# Patient Record
Sex: Female | Born: 1965
Health system: Southern US, Community
[De-identification: ages and names within clinical notes are randomized; demographics above are authoritative.]

## PROBLEM LIST (undated history)

## (undated) DIAGNOSIS — E669 Obesity, unspecified: Secondary | ICD-10-CM

## (undated) DIAGNOSIS — F419 Anxiety disorder, unspecified: Secondary | ICD-10-CM

## (undated) DIAGNOSIS — I10 Essential (primary) hypertension: Secondary | ICD-10-CM

## (undated) DIAGNOSIS — E7800 Pure hypercholesterolemia, unspecified: Secondary | ICD-10-CM

## (undated) HISTORY — PX: ABDOMINAL HYSTERECTOMY: SHX81

## (undated) HISTORY — PX: CHOLECYSTECTOMY: SHX55

## (undated) HISTORY — PX: GASTRIC BYPASS: SHX52

---

## 1999-06-18 ENCOUNTER — Emergency Department (HOSPITAL_COMMUNITY): Admission: EM | Admit: 1999-06-18 | Discharge: 1999-06-18 | Payer: Self-pay | Admitting: Family Medicine

## 1999-06-30 ENCOUNTER — Emergency Department (HOSPITAL_COMMUNITY): Admission: EM | Admit: 1999-06-30 | Discharge: 1999-07-01 | Payer: Self-pay | Admitting: Emergency Medicine

## 2000-03-04 ENCOUNTER — Other Ambulatory Visit: Admission: RE | Admit: 2000-03-04 | Discharge: 2000-03-04 | Payer: Self-pay | Admitting: Gynecology

## 2000-05-09 ENCOUNTER — Emergency Department (HOSPITAL_COMMUNITY): Admission: EM | Admit: 2000-05-09 | Discharge: 2000-05-09 | Payer: Self-pay

## 2000-10-07 ENCOUNTER — Ambulatory Visit (HOSPITAL_COMMUNITY): Admission: RE | Admit: 2000-10-07 | Discharge: 2000-10-07 | Payer: Self-pay | Admitting: Obstetrics & Gynecology

## 2003-07-19 ENCOUNTER — Emergency Department (HOSPITAL_COMMUNITY): Admission: EM | Admit: 2003-07-19 | Discharge: 2003-07-19 | Payer: Self-pay | Admitting: *Deleted

## 2003-11-23 ENCOUNTER — Encounter: Admission: RE | Admit: 2003-11-23 | Discharge: 2003-11-23 | Payer: Self-pay | Admitting: Obstetrics and Gynecology

## 2003-12-17 ENCOUNTER — Ambulatory Visit (HOSPITAL_COMMUNITY): Admission: RE | Admit: 2003-12-17 | Discharge: 2003-12-17 | Payer: Self-pay | Admitting: Obstetrics and Gynecology

## 2004-02-27 ENCOUNTER — Emergency Department (HOSPITAL_COMMUNITY): Admission: EM | Admit: 2004-02-27 | Discharge: 2004-02-27 | Payer: Self-pay | Admitting: Emergency Medicine

## 2005-03-09 ENCOUNTER — Emergency Department (HOSPITAL_COMMUNITY): Admission: EM | Admit: 2005-03-09 | Discharge: 2005-03-10 | Payer: Self-pay | Admitting: Emergency Medicine

## 2005-07-03 ENCOUNTER — Encounter (INDEPENDENT_AMBULATORY_CARE_PROVIDER_SITE_OTHER): Payer: Self-pay | Admitting: *Deleted

## 2005-07-03 ENCOUNTER — Ambulatory Visit: Payer: Self-pay | Admitting: Obstetrics and Gynecology

## 2005-08-23 ENCOUNTER — Emergency Department (HOSPITAL_COMMUNITY): Admission: EM | Admit: 2005-08-23 | Discharge: 2005-08-23 | Payer: Self-pay | Admitting: Emergency Medicine

## 2006-09-05 ENCOUNTER — Encounter: Payer: Self-pay | Admitting: Obstetrics and Gynecology

## 2006-09-05 ENCOUNTER — Ambulatory Visit: Payer: Self-pay | Admitting: Obstetrics and Gynecology

## 2006-09-11 ENCOUNTER — Ambulatory Visit (HOSPITAL_COMMUNITY): Admission: RE | Admit: 2006-09-11 | Discharge: 2006-09-11 | Payer: Self-pay | Admitting: Obstetrics and Gynecology

## 2009-04-07 ENCOUNTER — Emergency Department (HOSPITAL_COMMUNITY): Admission: EM | Admit: 2009-04-07 | Discharge: 2009-04-07 | Payer: Self-pay | Admitting: Emergency Medicine

## 2009-05-22 ENCOUNTER — Emergency Department (HOSPITAL_COMMUNITY): Admission: EM | Admit: 2009-05-22 | Discharge: 2009-05-22 | Payer: Self-pay | Admitting: Emergency Medicine

## 2009-07-13 ENCOUNTER — Encounter: Admission: RE | Admit: 2009-07-13 | Discharge: 2009-07-13 | Payer: Self-pay | Admitting: Internal Medicine

## 2009-08-08 ENCOUNTER — Ambulatory Visit (HOSPITAL_COMMUNITY): Admission: RE | Admit: 2009-08-08 | Discharge: 2009-08-08 | Payer: Self-pay | Admitting: Cardiology

## 2010-11-30 IMAGING — CR DG CHEST 2V
2 series · 2 of 2 positions shown · non-contrast
Comparison: None available.

CLINICAL DATA: Shortness of breath and weakness.  Body aches.
Headache and fever.

CHEST - 2 VIEW

[w chest pa]
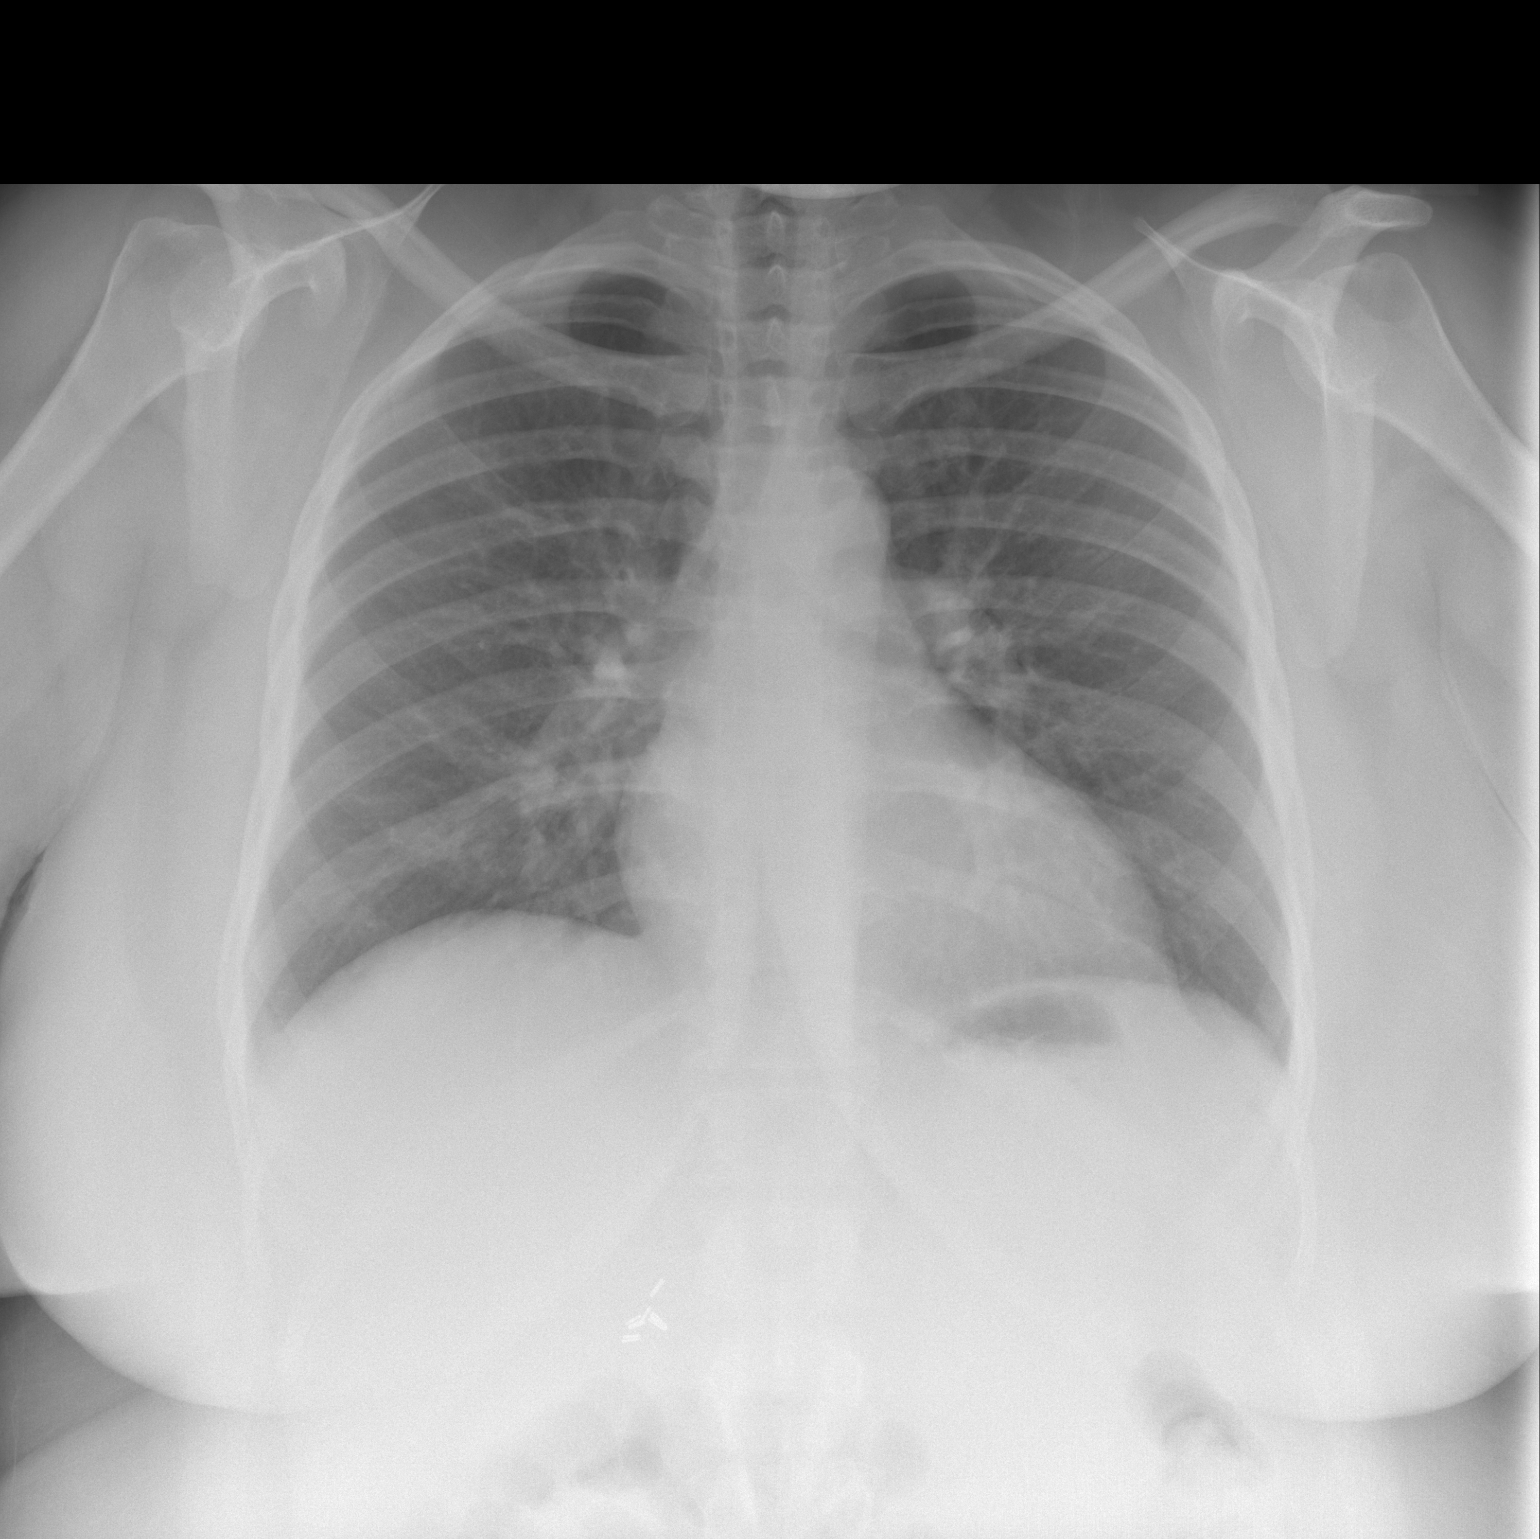

[w chest lat]
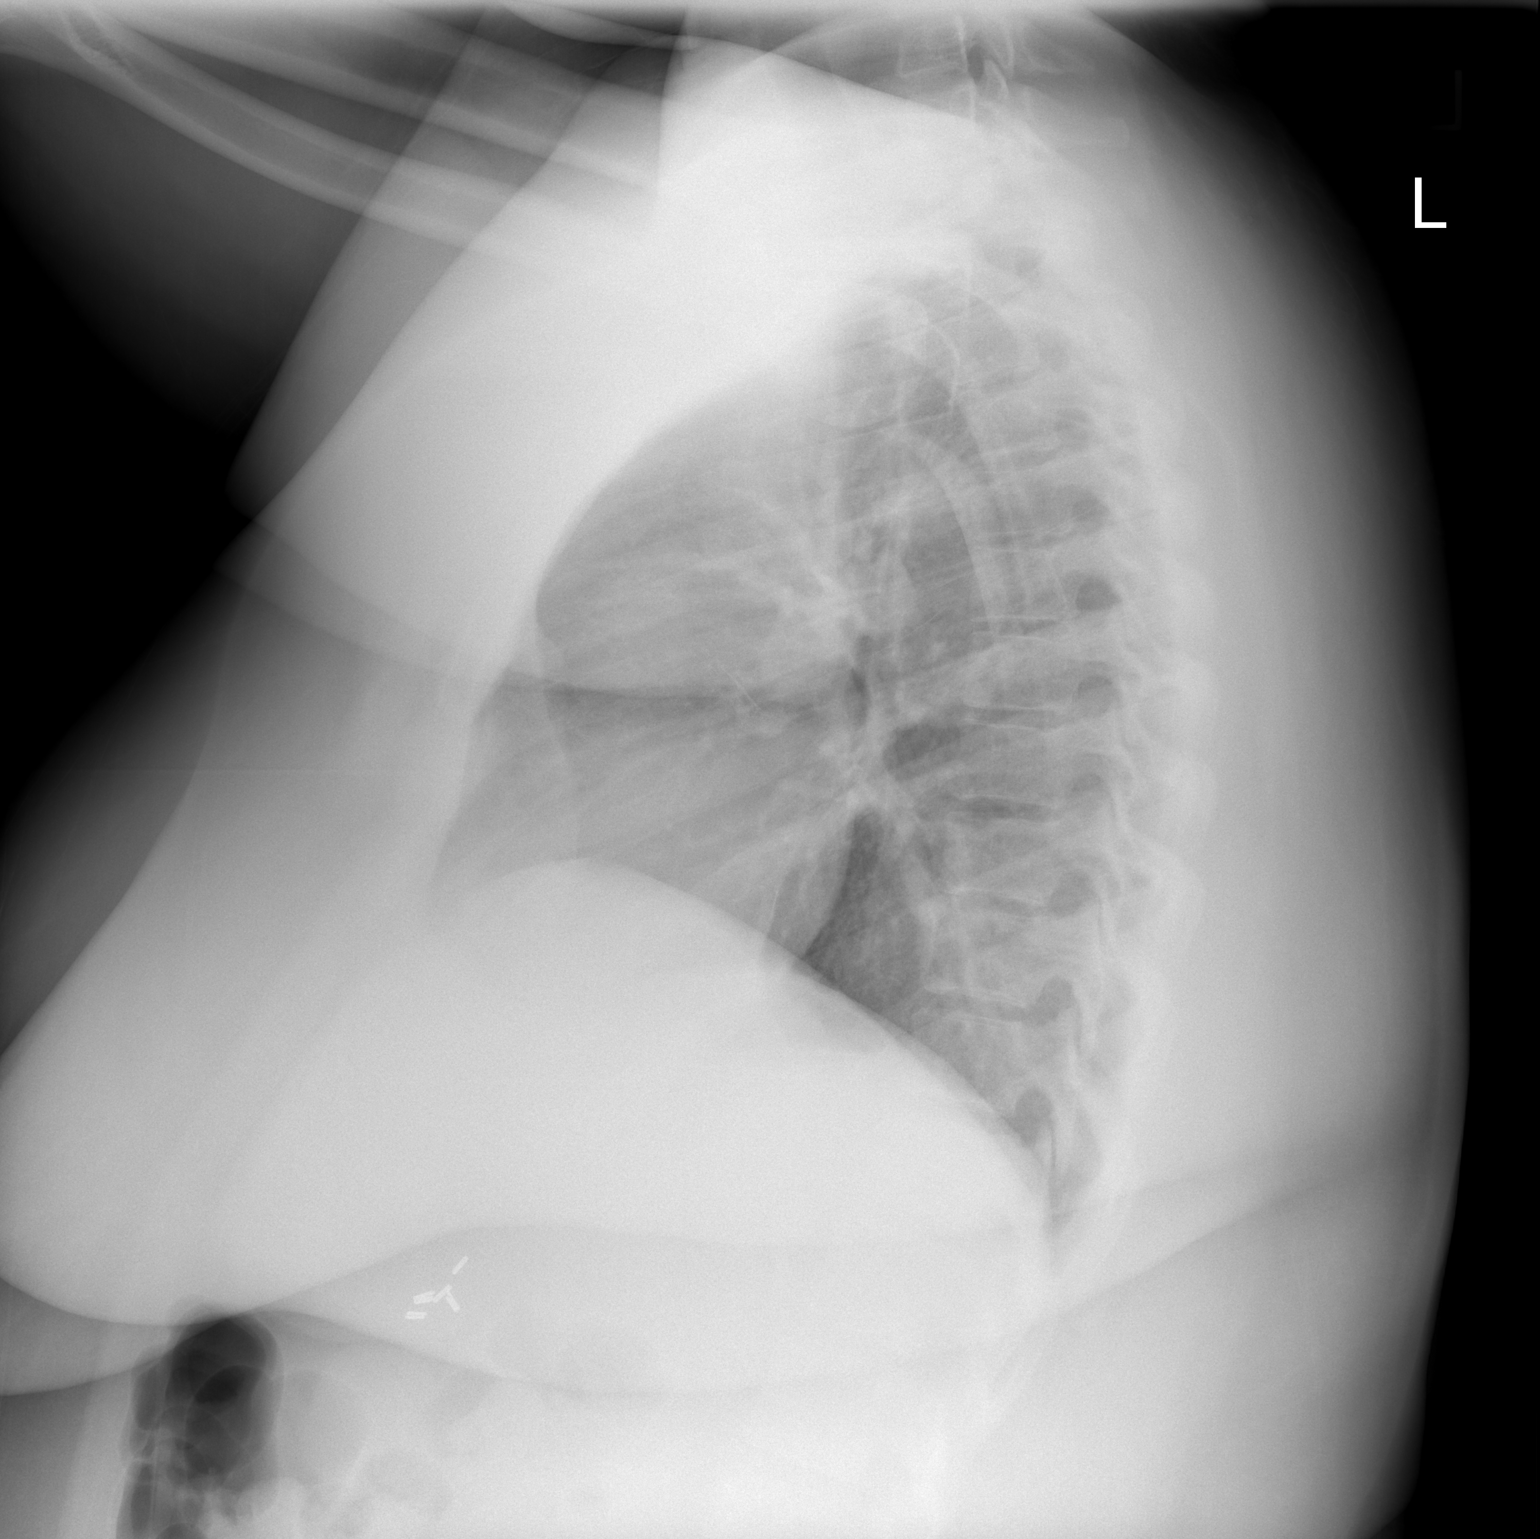

[2 of 2 positions shown; findings below may reference images not displayed]

FINDINGS: The cardiopericardial silhouette is within normal limits
for size.  Lung volumes are somewhat low.  No focal airspace
disease is present.  The visualized soft tissues and bony thorax
are unremarkable.
IMPRESSION: 1.  No acute cardiopulmonary disease.
2.  Low lung volumes.

## 2010-12-13 LAB — APTT: aPTT: 28 seconds (ref 24–37)

## 2010-12-13 LAB — BASIC METABOLIC PANEL
Calcium: 8.7 mg/dL (ref 8.4–10.5)
Creatinine, Ser: 0.53 mg/dL (ref 0.4–1.2)
GFR calc Af Amer: >60 mL/min (ref >60)
GFR calc non Af Amer: >60 mL/min (ref >60)

## 2010-12-13 LAB — CBC
MCHC: 32.6 g/dL (ref 30.0–36.0)
RBC: 3.48 MIL/uL — ABNORMAL LOW (ref 3.87–5.11)
WBC: 8.6 10*3/uL (ref 4.0–10.5)

## 2010-12-13 LAB — HCG, SERUM, QUALITATIVE: Preg, Serum: NEGATIVE

## 2010-12-13 LAB — PROTIME-INR: Prothrombin Time: 13.1 seconds (ref 11.6–15.2)

## 2010-12-17 LAB — POCT PREGNANCY, URINE: Preg Test, Ur: NEGATIVE

## 2010-12-17 LAB — BASIC METABOLIC PANEL
BUN: 10 mg/dL (ref 6–23)
Chloride: 107 mEq/L (ref 96–112)
Creatinine, Ser: 0.72 mg/dL (ref 0.4–1.2)

## 2010-12-17 LAB — URINALYSIS, ROUTINE W REFLEX MICROSCOPIC
Glucose, UA: NEGATIVE mg/dL
Ketones, ur: NEGATIVE mg/dL
Protein, ur: NEGATIVE mg/dL

## 2010-12-17 LAB — CBC
MCHC: 31.4 g/dL (ref 30.0–36.0)
MCV: 77 fL — ABNORMAL LOW (ref 78.0–100.0)
Platelets: 490 10*3/uL — ABNORMAL HIGH (ref 150–400)
WBC: 10.5 10*3/uL (ref 4.0–10.5)

## 2010-12-17 LAB — DIFFERENTIAL
Basophils Absolute: 0 10*3/uL (ref 0.0–0.1)
Basophils Relative: 0 % (ref 0–1)
Eosinophils Absolute: 0.2 10*3/uL (ref 0.0–0.7)
Lymphs Abs: 2.7 10*3/uL (ref 0.7–4.0)
Neutrophils Relative %: 61 % (ref 43–77)

## 2011-01-26 NOTE — Group Therapy Note (Signed)
NAME:  Vanessa Wells, MAJ             ACCOUNT NO.:  [id number]   MEDICAL RECORD NO.:  [id number]          PATIENT TYPE:  WOC   LOCATION:  [facility]                   FACILITY:  [facility]   PHYSICIAN:  Barth Kirks, MD    DATE OF BIRTH:  1965/12/31   DATE OF SERVICE:                                  CLINIC NOTE   CHIEF COMPLAINT:  Yearly exam with Pap and breast exam.   HISTORY OF PRESENT ILLNESS:  Patient is a 45 year old gravida 3, para 2-  0-1-2, who presents today for her yearly exam.  Since she was last seen  a year ago, she has been having problems with weight gain and fatigue.  Patient states she has been trying to lose weight, however, has not been  fully committed to it, and has not been exercising.  Patient still has  monthly periods.  Her last menstrual period was September 12.  Period  lasted 6-7 days.  She has been taking Ortho Tri-Cyclen Lo 20 and needs a  refill.  Patient is a nonsmoker.  She does not have problems with  migraines.  She currently is a bus driver.  She has never had a history  of a blood clot.   FAMILY HISTORY:  Patient does have an aunt on her mother's side who died  of breast cancer.  She also has a history of colon cancer in the family,  as well as a history of stomach cancer.  No history of uterine, cervix,  or ovarian cancer in the family.   REVIEW OF SYSTEMS:  Other than fatigue and weight gain, otherwise  negative.   PHYSICAL EXAMINATION:  Temperature today was 98.9, pulse 93, blood  pressure 145/84.  Weight was 293 pounds.  This is a 20 pound weight gain  since last year.  Height is 5 foot 4 inches.  She has never had a  mammogram.  GENERAL:  She is an obese [place] American female, pleasant appearing.  HEENT:  Normocephalic, atraumatic.  Extraocular muscles intact.  Moist  mucous membranes.  NECK:  Thyroid is noted to be slightly enlarged with no current pain or  soreness, however it does swell on her occasionally.  CARDIOVASCULAR:  She is  regular rate and rhythm, no murmurs, rubs, or  gallops.  LUNGS:  Clear to auscultation bilaterally.  ABDOMEN:  Soft, nontender, nondistended.  BREAST EXAM:  Nontender, no masses.  GYN EXAM:  Using chaperone present for the breast and GYN exam, patient  had a normal cervix, and no noted discharge, no palpable ovaries or  masses.  Pap smear was obtained.  LEGS:  There was bilateral +1 swelling to the feet.   ASSESSMENT/PLAN:  1. This is a 45 year old gravida 3, para 2-0-1-2, here for annual      exam.  We do go ahead and obtain a Pap smear today.  We scheduled      her for her baseline mammogram since she has a history of breast      cancer in the family.  We will schedule this appointment for her.  2. For her fatigue and enlarged thyroid, we will go ahead and  check a      TSH today.  She had a thyroid test done back in 2005 of March which      showed normal thyroid at 3.465 on her TSH.  Since she does have      history of thyroid problems in the family, we will go ahead and      check it out at this time.  3. Obesity.  Went ahead and encouraged the patient to begin exercising      and to get involved in a weight loss program.  She is also to seek      the advice of her primary care physician since she does have      insurance, and she does have medical problems that need to be      addressed by a primary care physician.  4. Followup 1 year.           ______________________________  Barth Kirks, MD     MB/MEDQ  D:  09/05/2006  T:  09/05/2006  Job:  [id number]

## 2011-01-26 NOTE — Group Therapy Note (Signed)
NAME:  Vanessa Wells, Vanessa Wells                       ACCOUNT NO.:  [id number]   MEDICAL RECORD NO.:  [id number]                   PATIENT TYPE:  OUT   LOCATION:  [facility]                           FACILITY:  [facility]   PHYSICIAN:  Argentina Donovan, MD                     DATE OF BIRTH:  September 26, 1965   DATE OF SERVICE:  11/23/2003                                    CLINIC NOTE   REASON FOR VISIT:  The patient is a 45 year old African-American female who  is here today for Pap smear.  The patient also complained of irregular  cycles over the past 3 months that has actually been gradually over the past  year.  She has been having periods at least twice a month and has been heavy  occasionally with some clots.  She had her menarche at the age of 66 and she  has had normal periods through that period.  She is a gravida 3 para 2 plus  an abortion, has two daughters alive.  The patient has not been sexually  active over the past 1 year.  She has, however, noted increase in her weight  over the same period.   ALLERGIES:  No known drug allergies.   SOCIAL HISTORY:  The patient is single, lives alone in [place], denies  any tobacco use, occasional alcohol but that is it.   PAST MEDICAL HISTORY:  She is status post cholecystectomy, history of  bronchitis, and obesity.   MEDICATIONS:  Multivitamin supplement.   FAMILY HISTORY:  Mom had thyroid disease and diabetes.  Grandmom died of  cancer of the stomach and aunt died of breast cancer.   REVIEW OF SYSTEMS:  Grossly normal.   PHYSICAL EXAMINATION:  VITAL SIGNS:  She is afebrile with a pulse of 80,  blood pressure is 140/91, weight of 276.6.  GENERAL:  She is alert, oriented, in no acute distress.  CHEST:  Clear to auscultation bilaterally.  CARDIOVASCULAR:  She has a regular rate and rhythm.  ABDOMEN:  Benign.  EXTREMITIES:  Showed no edema, cyanosis, or clubbing.   ASSESSMENT AND PLAN:  1. Dysfunctional uterine bleeding.  The patient seems to  have DUB.  Combined     with her weight gain could be hormonal.  Could also be that she is PCOS.     At this time we will start first with trying to regulate the patient's     periods using birth control pills.  I have given her Ortho Tri-Cyclen     today.  We will also do a vaginal ultrasound as well as TSH.  The patient     may require screening for CBG for diabetes as well with her family     history.  2. The patient requests for HIV test secondary to high-risk exposure about a     year ago.  Therefore, we will go ahead and check her for HIV as mentioned  and she has been counseled accordingly.     Cassandria Santee, M.D.                Argentina Donovan, MD    MLG/MEDQ  D:  11/23/2003  T:  11/24/2003  Job:  [id number]

## 2011-01-26 NOTE — Group Therapy Note (Signed)
NAME:  Vanessa Wells, Vanessa Wells NO.:  [id number]   MEDICAL RECORD NO.:  [id number]          PATIENT TYPE:  WOC   LOCATION:  [facility]                   FACILITY:  [facility]   PHYSICIAN:  Ellis Parents, MD    DATE OF BIRTH:  Mar 12, 1966   DATE OF SERVICE:                                    CLINIC NOTE   SUBJECTIVE:  This is a 45 year old female who returns for a routine exam and  refill on her OCs.  The patient has no significant complaints.  She did  desire a diabetic screening test because of a maternal history of diabetes.  There is also a strong family history of hypertension.   OBJECTIVE:  VITAL SIGNS:  Blood pressure today using a large cuff was 142/88  in the left arm.  Weight 272 pounds.  PELVIC:  External genitalia were normal.  The vagina is clean.  The cervix  appears well epithelialized.  The uterus cannot be outlined because of  obesity.  There are no palpable adnexal masses.  Pap smear is taken.   LABORATORY DATA:  A fasting blood sugar followed by 75 gm of Glucola and a 2-  hour postprandial blood sugar is ordered.   PLAN:  The patient is given prescription for Ortho Tri-Cyclen Lo 20, #28,  refillable for one year.  Patient is going to [facility] in the  hope that she can lose 125 pounds.  The patient is to return in one year.           ______________________________  Ellis Parents, MD     SA/MEDQ  D:  07/03/2005  T:  07/03/2005  Job:  [id number]

## 2011-10-18 ENCOUNTER — Encounter (HOSPITAL_COMMUNITY): Payer: Self-pay | Admitting: Emergency Medicine

## 2011-10-18 ENCOUNTER — Emergency Department (HOSPITAL_COMMUNITY)
Admission: EM | Admit: 2011-10-18 | Discharge: 2011-10-18 | Disposition: A | Discharge status: Home / Self Care | Payer: BC Managed Care – PPO | Attending: Emergency Medicine | Admitting: Emergency Medicine

## 2011-10-18 DIAGNOSIS — R209 Unspecified disturbances of skin sensation: Secondary | ICD-10-CM | POA: Insufficient documentation

## 2011-10-18 DIAGNOSIS — E7800 Pure hypercholesterolemia, unspecified: Secondary | ICD-10-CM | POA: Insufficient documentation

## 2011-10-18 DIAGNOSIS — R202 Paresthesia of skin: Secondary | ICD-10-CM

## 2011-10-18 DIAGNOSIS — F419 Anxiety disorder, unspecified: Secondary | ICD-10-CM | POA: Insufficient documentation

## 2011-10-18 DIAGNOSIS — Z79899 Other long term (current) drug therapy: Secondary | ICD-10-CM | POA: Insufficient documentation

## 2011-10-18 DIAGNOSIS — M79609 Pain in unspecified limb: Secondary | ICD-10-CM | POA: Insufficient documentation

## 2011-10-18 DIAGNOSIS — E669 Obesity, unspecified: Secondary | ICD-10-CM | POA: Insufficient documentation

## 2011-10-18 HISTORY — DX: Pure hypercholesterolemia, unspecified: E78.00

## 2011-10-18 HISTORY — DX: Obesity, unspecified: E66.9

## 2011-10-18 HISTORY — DX: Anxiety disorder, unspecified: F41.9

## 2011-10-18 LAB — POCT I-STAT, CHEM 8
Hemoglobin: 14.3 g/dL (ref 12.0–15.0)
Sodium: 140 mEq/L (ref 135–145)
TCO2: 26 mmol/L (ref 0–100)

## 2011-10-18 NOTE — ED Notes (Signed)
Pt c/o hands, legs and face burning upon waking up this am; pt sts was here yesterday and wore PPE to visit sister; pt sts felt better after taking a shower this am; no hives or rash noted and no resp issue

## 2011-10-18 NOTE — ED Provider Notes (Signed)
Medical screening examination/treatment/procedure(s) were conducted as a shared visit with non-physician practitioner(s) and myself.  I personally evaluated the patient during the encounter   Vanessa Wells L Vanessa Rae, MD 10/18/11 1523 

## 2011-10-18 NOTE — ED Notes (Signed)
Discharge instructions reviewed; verbalizes understanding.  No questions asked; no further c/o's voiced.  Pt ambulatory to lobby. 

## 2011-10-18 NOTE — ED Provider Notes (Signed)
History     CSN: [id number]  Arrival date & time 10/18/11  4098   First MD Initiated Contact with Patient 10/18/11 [phone]      Chief Complaint  Patient presents with  . Hand Pain    (Consider location/radiation/quality/duration/timing/severity/associated sxs/prior treatment) HPI The patient presents to the Er with a burning/tingling sensation that started this morning when she woke up. She states that she did not take her medications for the last 2 days. She states that she has no CP, SOB, weakness, fever, N/V, abd pain, or headache. The patient states that she is not having this sensation at this time. Past Medical History  Diagnosis Date  . Hypercholesterolemia   . Anxiety   . Obesity     History reviewed. No pertinent past surgical history.  History reviewed. No pertinent family history.  History  Substance Use Topics  . Smoking status: Never Smoker   . Smokeless tobacco: Not on file  . Alcohol Use: No    OB History    Grav Para Term Preterm Abortions TAB SAB Ect Mult Living                  Review of Systems All pertinent positives and negatives reviewed in the history of present illness  Allergies  Review of patient's allergies indicates no known allergies.  Home Medications   Current Outpatient Rx  Name Route Sig Dispense Refill  . NIACIN ER (ANTIHYPERLIPIDEMIC) 500 MG PO TBCR Oral Take 500 mg by mouth at bedtime.    Marland Kitchen PRESCRIPTION MEDICATION Oral Take 1 tablet by mouth every morning. Weight loss medication.    . SERTRALINE HCL 100 MG PO TABS Oral Take 100 mg by mouth daily.      BP 126/88  Pulse 96  Temp(Src) 98 F (36.7 C) (Oral)  Resp 20  SpO2 98%  Physical Exam  Constitutional: She is oriented to person, place, and time. She appears well-developed and well-nourished. No distress.  HENT:  Head: Normocephalic and atraumatic.  Neck: Normal range of motion. Neck supple.  Cardiovascular: Normal rate, regular rhythm and normal heart sounds.  Exam  reveals no gallop and no friction rub.   No murmur heard. Pulmonary/Chest: Effort normal and breath sounds normal. No respiratory distress. She has no wheezes. She has no rales.  Neurological: She is alert and oriented to person, place, and time. She displays normal reflexes. She exhibits normal muscle tone. Coordination normal.  Skin: Skin is warm and dry. No rash noted.    ED Course  Procedures (including critical care time)  Labs Reviewed  POCT I-STAT, CHEM 8 - Abnormal; Notable for the following:    Glucose, Bld 119 (*)    All other components within normal limits   The patient issue is most likely related to her not taking her medications over the last 2 days. The patient is asked to follow up with her PCP. She is asked to return here for any worsening in her condition.        MDM  See above comments.  MDM Reviewed: vitals and nursing note Interpretation: labs            Carlyle Dolly, PA-C 10/18/11 1125

## 2012-04-02 ENCOUNTER — Encounter (HOSPITAL_COMMUNITY): Payer: Self-pay | Admitting: *Deleted

## 2012-04-02 ENCOUNTER — Emergency Department (HOSPITAL_COMMUNITY)
Admission: EM | Admit: 2012-04-02 | Discharge: 2012-04-02 | Disposition: A | Discharge status: Home / Self Care | Payer: BC Managed Care – PPO | Attending: Emergency Medicine | Admitting: Emergency Medicine

## 2012-04-02 DIAGNOSIS — R002 Palpitations: Secondary | ICD-10-CM

## 2012-04-02 DIAGNOSIS — Z90710 Acquired absence of both cervix and uterus: Secondary | ICD-10-CM | POA: Insufficient documentation

## 2012-04-02 DIAGNOSIS — Z9089 Acquired absence of other organs: Secondary | ICD-10-CM | POA: Insufficient documentation

## 2012-04-02 DIAGNOSIS — E7800 Pure hypercholesterolemia, unspecified: Secondary | ICD-10-CM | POA: Insufficient documentation

## 2012-04-02 DIAGNOSIS — E669 Obesity, unspecified: Secondary | ICD-10-CM | POA: Insufficient documentation

## 2012-04-02 DIAGNOSIS — F411 Generalized anxiety disorder: Secondary | ICD-10-CM | POA: Insufficient documentation

## 2012-04-02 DIAGNOSIS — R7309 Other abnormal glucose: Secondary | ICD-10-CM | POA: Insufficient documentation

## 2012-04-02 NOTE — ED Notes (Signed)
Pt reports having constant palpitations with SOB x 3 days ago.  Pt denies any dizziness or cp with the palpitations.  Pt reports feeling palpitations at present but denies SOB.

## 2012-04-02 NOTE — ED Provider Notes (Signed)
History     CSN: [id number]  Arrival date & time 04/02/12  1050   First MD Initiated Contact with Patient 04/02/12 1129      Chief Complaint  Patient presents with  . Palpitations    (Consider location/radiation/quality/duration/timing/severity/associated sxs/prior treatment) HPI Comments: Patient presents with a few days of palpitations.  She notes that she only feels them at rest.  She has no associated chest pain or shortness of breath on my exam despite the shortness of breath mentioned in the nursing triage note.  Patient states that she feels that her heart will skip a beat before resetting.  She denies any fevers or increased coughing or infectious symptoms.  She has no nausea, vomiting or diaphoresis.  She's no lightheadedness or dizziness or other presyncopal symptoms.  She denies any medication changes.  She has not had a change in caffeine intake.  She denies any drug or alcohol use and does not smoke.  Patient has noted increased stress as she is complaining on lying classes and sleeping less to do her homework at night.  She's concerned she may have a component of anxiety that is causing this.  She denies any prior cardiac history.  She even notes in the past that she wore a Holter monitor without any acute findings.  Patient is a 46 y.o. female presenting with palpitations. The history is provided by the patient. No language interpreter was used.  Palpitations  This is a new problem. Pertinent negatives include no fever, no chest pain, no abdominal pain, no nausea, no vomiting, no headaches, no back pain, no dizziness, no cough and no shortness of breath.    Past Medical History  Diagnosis Date  . Hypercholesterolemia   . Anxiety   . Obesity   . Diabetes mellitus     borderline    Past Surgical History  Procedure Date  . Abdominal hysterectomy   . Cholecystectomy     History reviewed. No pertinent family history.  History  Substance Use Topics  . Smoking status:  Never Smoker   . Smokeless tobacco: Not on file  . Alcohol Use: No    OB History    Grav Para Term Preterm Abortions TAB SAB Ect Mult Living                  Review of Systems  Constitutional: Negative.  Negative for fever and chills.  HENT: Negative.   Eyes: Negative.   Respiratory: Negative.  Negative for cough and shortness of breath.   Cardiovascular: Positive for palpitations. Negative for chest pain.  Gastrointestinal: Negative.  Negative for nausea, vomiting, abdominal pain and diarrhea.  Genitourinary: Negative.  Negative for dysuria.  Musculoskeletal: Negative.  Negative for back pain.  Skin: Negative.  Negative for color change and rash.  Neurological: Negative.  Negative for dizziness, syncope, light-headedness and headaches.  Hematological: Negative.  Negative for adenopathy.  Psychiatric/Behavioral: Negative.  Negative for confusion.  All other systems reviewed and are negative.    Allergies  Niaspan  Home Medications   Current Outpatient Rx  Name Route Sig Dispense Refill  . PHENTERMINE HCL 37.5 MG PO CAPS Oral Take 37.5 mg by mouth every morning.    Marland Kitchen SERTRALINE HCL 100 MG PO TABS Oral Take 100 mg by mouth daily.      BP 134/81  Pulse 88  Temp 99.6 F (37.6 C) (Oral)  Resp 20  SpO2 100%  Physical Exam  Nursing note and vitals reviewed. Constitutional: She is oriented  to person, place, and time. She appears well-developed and well-nourished.  Non-toxic appearance. She does not have a sickly appearance.  HENT:  Head: Normocephalic and atraumatic.  Eyes: Conjunctivae, EOM and lids are normal. Pupils are equal, round, and reactive to light. No scleral icterus.  Neck: Trachea normal and normal range of motion. Neck supple.  Cardiovascular: Normal rate, regular rhythm and normal heart sounds.   Pulmonary/Chest: Effort normal and breath sounds normal. No respiratory distress. She has no wheezes. She has no rales.  Abdominal: Soft. Normal appearance.  There is no tenderness. There is no rebound, no guarding and no CVA tenderness.  Musculoskeletal: Normal range of motion.  Neurological: She is alert and oriented to person, place, and time. She has normal strength.  Skin: Skin is warm, dry and intact. No rash noted.  Psychiatric: She has a normal mood and affect. Her behavior is normal. Judgment and thought content normal.    ED Course  Procedures (including critical care time)  Labs Reviewed - No data to display No results found.   1. Palpitations      Date: 04/02/2012  Rate: 88  Rhythm: normal sinus rhythm  QRS Axis: normal  Intervals: normal  ST/T Wave abnormalities: normal  Conduction Disutrbances:none  Narrative Interpretation:   Old EKG Reviewed: unchanged From 04-07-09   MDM  Patient with palpitations of unclear etiology.  She has a normal EKG.  She has minimal risk factors for cardiac disease and is without any chest pain or difficulty breathing.  She has had a hysterectomy and so she is not pregnant at this time.  There's been no new medication changes or increased caffeine intake.  Patient has not had any presyncopal symptoms.  She has normal vital signs here as well.  No symptoms for infection.  Patient does note increased stress and decreased sleep over the last several weeks and does feel this could be consistent with anxiety.  As the patient has a normal EKG and no symptoms concerning for pulmonary embolus or ACS I feel she is safe for discharge home with followup with her PCP.        Nat Christen, MD 04/02/12 1144

## 2012-04-02 NOTE — ED Notes (Signed)
Pt reports palpitations x3 days. States she has been under heavy amount of stress. States this has happened in past. Denies hx of cardiac problems

## 2016-12-18 DIAGNOSIS — I10 Essential (primary) hypertension: Secondary | ICD-10-CM | POA: Insufficient documentation

## 2016-12-18 DIAGNOSIS — J189 Pneumonia, unspecified organism: Secondary | ICD-10-CM | POA: Insufficient documentation

## 2016-12-18 DIAGNOSIS — G4733 Obstructive sleep apnea (adult) (pediatric): Secondary | ICD-10-CM | POA: Insufficient documentation

## 2016-12-19 DIAGNOSIS — R739 Hyperglycemia, unspecified: Secondary | ICD-10-CM | POA: Insufficient documentation

## 2017-11-13 ENCOUNTER — Encounter (HOSPITAL_BASED_OUTPATIENT_CLINIC_OR_DEPARTMENT_OTHER): Payer: Self-pay

## 2017-11-13 ENCOUNTER — Emergency Department (HOSPITAL_BASED_OUTPATIENT_CLINIC_OR_DEPARTMENT_OTHER)
Admission: EM | Admit: 2017-11-13 | Discharge: 2017-11-13 | Disposition: A | Discharge status: Home / Self Care | Payer: BC Managed Care – PPO | Attending: Emergency Medicine | Admitting: Emergency Medicine

## 2017-11-13 ENCOUNTER — Other Ambulatory Visit: Payer: Self-pay

## 2017-11-13 ENCOUNTER — Emergency Department (HOSPITAL_BASED_OUTPATIENT_CLINIC_OR_DEPARTMENT_OTHER): Payer: BC Managed Care – PPO

## 2017-11-13 DIAGNOSIS — J181 Lobar pneumonia, unspecified organism: Secondary | ICD-10-CM | POA: Diagnosis not present

## 2017-11-13 DIAGNOSIS — E119 Type 2 diabetes mellitus without complications: Secondary | ICD-10-CM | POA: Diagnosis not present

## 2017-11-13 DIAGNOSIS — I10 Essential (primary) hypertension: Secondary | ICD-10-CM | POA: Insufficient documentation

## 2017-11-13 DIAGNOSIS — J189 Pneumonia, unspecified organism: Secondary | ICD-10-CM

## 2017-11-13 DIAGNOSIS — R05 Cough: Secondary | ICD-10-CM | POA: Diagnosis present

## 2017-11-13 HISTORY — DX: Essential (primary) hypertension: I10

## 2017-11-13 MED ORDER — IPRATROPIUM-ALBUTEROL 0.5-2.5 (3) MG/3ML IN SOLN
RESPIRATORY_TRACT | Status: AC
  Administered 2017-11-13: 3 mL
  Filled 2017-11-13: qty 3

## 2017-11-13 MED ORDER — AMOXICILLIN 500 MG PO CAPS
1000.0000 mg | ORAL_CAPSULE | Freq: Once | ORAL | Status: AC
  Administered 2017-11-13: 1000 mg via ORAL
  Filled 2017-11-13: qty 2

## 2017-11-13 MED ORDER — AZITHROMYCIN 250 MG PO TABS: 250.0000 mg | ORAL_TABLET | Freq: Every day | ORAL | 0 refills | Status: DC

## 2017-11-13 MED ORDER — ALBUTEROL SULFATE HFA 108 (90 BASE) MCG/ACT IN AERS
2.0000 | INHALATION_SPRAY | Freq: Once | RESPIRATORY_TRACT | Status: AC
  Administered 2017-11-13: 2 via RESPIRATORY_TRACT
  Filled 2017-11-13: qty 6.7

## 2017-11-13 MED ORDER — AMOXICILLIN 500 MG PO CAPS: 500.0000 mg | ORAL_CAPSULE | Freq: Three times a day (TID) | ORAL | 0 refills | Status: DC

## 2017-11-13 MED ORDER — ALBUTEROL SULFATE (2.5 MG/3ML) 0.083% IN NEBU
INHALATION_SOLUTION | RESPIRATORY_TRACT | Status: AC
  Administered 2017-11-13: 2.5 mg
  Filled 2017-11-13: qty 3

## 2017-11-13 MED ORDER — AEROCHAMBER PLUS FLO-VU MEDIUM MISC
1.0000 | Freq: Once | Status: AC
  Administered 2017-11-13: 1
  Filled 2017-11-13: qty 1

## 2017-11-13 MED ORDER — AZITHROMYCIN 250 MG PO TABS
500.0000 mg | ORAL_TABLET | Freq: Once | ORAL | Status: AC
  Administered 2017-11-13: 500 mg via ORAL
  Filled 2017-11-13: qty 2

## 2017-11-13 MED FILL — AZITHROMYCIN 250 MG TABLET: 250 | 5 days supply | Qty: 6 | Fill #0

## 2017-11-13 MED FILL — AMOXICILLIN 500 MG CAPSULE: 500 | 7 days supply | Qty: 21 | Fill #0

## 2017-11-13 NOTE — ED Triage Notes (Signed)
C/o flu like sx x  3 weeks-DOE-steady gait

## 2017-11-13 NOTE — Discharge Instructions (Signed)
Take antibiotics until finished. You had your first dose in the ER today. You do not have to start your prescriptions until tomorrow. Use your inhaler as needed every 4 hours 1-2 puffs.

## 2017-11-21 NOTE — ED Provider Notes (Signed)
[facility] EMERGENCY DEPARTMENT Provider Note   CSN: [id number] Arrival date & time: 11/13/17  1152     History   Chief Complaint Chief Complaint  Patient presents with  . Cough    HPI Vanessa Wells is a 52 y.o. female.  HPI   52 year old female with cough and congestion.  Symptom onset about 3 weeks ago.  Persistent since then.  Subjective fever.  No unusual leg pain or swelling.  Feels congested.  Cough is occasionally productive.  Past Medical History:  Diagnosis Date  . Anxiety   . Diabetes mellitus    borderline  . Hypercholesterolemia   . Hypertension   . Obesity     Patient Active Problem List   Diagnosis Date Noted  . Hypercholesterolemia   . Anxiety   . Obesity     Past Surgical History:  Procedure Laterality Date  . ABDOMINAL HYSTERECTOMY    . CHOLECYSTECTOMY      OB History    No data available       Home Medications    Prior to Admission medications   Medication Sig Start Date End Date Taking? Authorizing Provider  amoxicillin (AMOXIL) 500 MG capsule Take 1 capsule (500 mg total) by mouth 3 (three) times daily. 11/13/17   Raeford Razor, MD  azithromycin (ZITHROMAX Z-PAK) 250 MG tablet Take 1 tablet (250 mg total) by mouth daily. 2 pills (500mg ) day 1 then 1 pill (250mg ) days 2-5. 11/13/17   Raeford Razor, MD    Family History No family history on file.  Social History Social History   Tobacco Use  . Smoking status: Never Smoker  . Smokeless tobacco: Never Used  Substance Use Topics  . Alcohol use: No  . Drug use: No     Allergies   Niaspan [niacin]   Review of Systems Review of Systems  All systems reviewed and negative, other than as noted in HPI.  Physical Exam Updated Vital Signs BP (!) 136/92 (BP Location: Left Arm)   Pulse (!) 105   Temp 99.1 F (37.3 C) (Oral)   Resp (!) 24   Ht 5\' 4"  (1.626 m)   Wt (!) 144.7 kg (319 lb)   SpO2 98%   BMI 54.76 kg/m   Physical Exam  Constitutional: She  appears well-developed and well-nourished. No distress.  HENT:  Head: Normocephalic and atraumatic.  Eyes: Conjunctivae are normal. Right eye exhibits no discharge. Left eye exhibits no discharge.  Neck: Neck supple.  Cardiovascular: Normal rate, regular rhythm and normal heart sounds. Exam reveals no gallop and no friction rub.  No murmur heard. Pulmonary/Chest: Effort normal and breath sounds normal. No respiratory distress.  Abdominal: Soft. She exhibits no distension. There is no tenderness.  Musculoskeletal: She exhibits no edema or tenderness.  Lower extremities symmetric as compared to each other. No calf tenderness. Negative Homan's. No palpable cords.   Neurological: She is alert.  Skin: Skin is warm and dry.  Psychiatric: She has a normal mood and affect. Her behavior is normal. Thought content normal.  Nursing note and vitals reviewed.    ED Treatments / Results  Labs (all labs ordered are listed, but only abnormal results are displayed) Labs Reviewed - No data to display  EKG  EKG Interpretation None       Radiology No results found.   Dg Chest 2 View  Result Date: 11/13/2017 CLINICAL DATA:  Two weeks of cough, wheezing, and fatigue. Onset of fever 2 days ago. EXAM: CHEST -  2 VIEW COMPARISON:  Chest x-ray and chest CT scan of December 18, 2016 and [place] and lateral chest x-ray of April 07, 2009. FINDINGS: The lungs are adequately inflated. The lung markings are increased in the posterior aspect of the left lower lobe. The right lung is clear. There is no pleural effusion. The heart and pulmonary vascularity are normal. The mediastinum is normal in width. The bony thorax exhibits no acute abnormality. IMPRESSION: Left lower lobe atelectasis or pneumonia. Followup PA and lateral chest X-ray is recommended in 3-4 weeks following trial of antibiotic therapy to ensure resolution and exclude underlying malignancy. Electronically Signed   By: David  SwazilandJordan M.D.   On: 11/13/2017 12:40     Procedures Procedures (including critical care time)  Medications Ordered in ED Medications  albuterol (PROVENTIL) (2.5 MG/3ML) 0.083% nebulizer solution (2.5 mg  Given 11/13/17 1205)  ipratropium-albuterol (DUONEB) 0.5-2.5 (3) MG/3ML nebulizer solution (3 mLs  Given 11/13/17 1205)  albuterol (PROVENTIL HFA;VENTOLIN HFA) 108 (90 Base) MCG/ACT inhaler 2 puff (2 puffs Inhalation Given 11/13/17 1320)  AEROCHAMBER PLUS FLO-VU MEDIUM MISC 1 each (1 each Other Given 11/13/17 1321)  azithromycin (ZITHROMAX) tablet 500 mg (500 mg Oral Given 11/13/17 1333)  amoxicillin (AMOXIL) capsule 1,000 mg (1,000 mg Oral Given 11/13/17 1333)     Initial Impression / Assessment and Plan / ED Course  I have reviewed the triage vital signs and the nursing notes.  Pertinent labs & imaging results that were available during my care of the patient were reviewed by me and considered in my medical decision making (see chart for details).     51yF with cough and DOE. CXR with possible L sided infiltrate. Based on clinical picture, will place on abx.   Final Clinical Impressions(s) / ED Diagnoses   Final diagnoses:  Community acquired pneumonia of left lower lobe of lung [facility](H[facility])    ED Discharge Orders        Ordered    azithromycin (ZITHROMAX Z-PAK) 250 MG tablet  Daily     11/13/17 1323    amoxicillin (AMOXIL) 500 MG capsule  3 times daily     11/13/17 1323       Raeford RazorKohut, Mikaiah Stoffer, MD 11/21/17 1215

## 2018-01-09 ENCOUNTER — Other Ambulatory Visit (HOSPITAL_COMMUNITY): Payer: Self-pay | Admitting: General Surgery

## 2018-01-20 ENCOUNTER — Ambulatory Visit: Payer: BC Managed Care – PPO | Admitting: Registered"

## 2018-01-24 ENCOUNTER — Encounter: Payer: Self-pay | Admitting: Registered"

## 2018-01-24 ENCOUNTER — Encounter: Payer: BC Managed Care – PPO | Attending: General Surgery | Admitting: Registered"

## 2018-01-24 DIAGNOSIS — E669 Obesity, unspecified: Secondary | ICD-10-CM

## 2018-01-24 DIAGNOSIS — Z713 Dietary counseling and surveillance: Secondary | ICD-10-CM | POA: Diagnosis not present

## 2018-01-24 NOTE — Progress Notes (Signed)
Pre-Op Assessment Visit:  Pre-Operative Sleeve Gastrectomy Surgery  Medical Nutrition Therapy:  Appt start time: 10:25  End time:  11:55  Patient was seen on 01/24/2018 for Pre-Operative Nutrition Assessment. Assessment and letter of approval faxed to [facility] Bariatric Surgery Program coordinator on 01/24/2018.   Pt expectation of surgery: improve energy, self-esteem, wants to shop in different section of clothing, to wear clothing she has not been able to wear in the past, prolong life, improve health  Pt expectation of Dietitian: none stated  Start weight at [facility]: 323.2 BMI: 55.48  Co-morbidities Pt arrives 20 min late. Pt states bariatric surgery is her last option. Pt states her mom passed away last 2023-06-03 and that was an eye-opener for her related to health. Pt reports waiting on a mental health professional to reach out to her to help her move forward. Pt states she has social anxiety; taking zoloft. Pt states she has fear of crowded places but medications help. Pt states she is an emotional eater. Pt states she does not have a social life. Pt states she has neuropathy and tingling but has not been diagnosed with diabetes. 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 Pt states she does not recall her last  A1c but states her value was good. Pt states she has financial challenges. Pt states she has had followed diets in the past but had to stop due to finances each time. Pt states this time will be different. Pt has limited physical activity due to knee pain and left ankle swelling.   Pt states she comes home, eats, and goes to sleep. Pt states her weakness is cakes and similar items. Pt states she has started back drinking soda at times. Pt states her daughter is the cook and pt realizes that she will have to use self-discipline/self-control regardless of how good the food smells.   Pt states she understands bariatric surgery and what it takes but implies that the surgery will do it  for her. I would recommend more one-on-one visits to better prepare pt for bariatric post-op success and developing a healthy relationship with food.   Per insurance, pt needs 0 SWL visits prior to surgery.    24 hr Dietary Recall: First Meal: skips Snack: none Second Meal: meatballs + jasmine rice Snack: none Third Meal: meatballs + jasmine rice Snack: none Beverages: water, soda, milkshake (occasionally), sweet tea  Encouraged to engage in 75 minutes of moderate physical activity including cardiovascular and weight baring weekly  Handouts given during visit include:  . Pre-Op Goals . Bariatric Surgery Protein Shakes . Vitamin and Mineral Recommendations  During the appointment today the following Pre-Op Goals were reviewed with the patient: . Track your food and beverage: MyFitness Pal or Baritastic App . Make healthy food choices . Begin to limit portion sizes . Limited concentrated sugars and fried foods . Keep fat/sugar in the single digits per serving on         food labels . Practice CHEWING your food  (aim for 30 chews per bite or until applesauce consistency) . Practice not drinking 15 minutes before, during, and 30 minutes after each meal/snack . Avoid all carbonated beverages  . Avoid/limit caffeinated beverages  . Avoid all sugar-sweetened beverages . Avoid alcohol . Consume 3 meals per day; eat every 3-5 hours . Make a list of non-food related activities . Aim for 64-100 ounces of FLUID daily  . Aim for at least 60-80 grams of PROTEIN daily . Look for a  liquid protein source that contain ?15 g protein and ?5 g carbohydrate  (ex: shakes, drinks, shots) . Physical activity is an important part of a healthy lifestyle so keep it moving!  Follow diet recommendations listed below Energy and Macronutrient Recommendations: Calories: 1800 Carbohydrate: 200 Protein: 135 Fat: 50  Demonstrated degree of understanding via:  Teach Back   Teaching Method Utilized:   Visual Auditory Hands on  Barriers to learning/adherence to lifestyle change: finances, emotional eater  Patient to call the [facility] to enroll in Pre-Op and Post-Op Nutrition Education when surgery date is scheduled.

## 2018-01-27 ENCOUNTER — Telehealth: Payer: Self-pay | Admitting: *Deleted

## 2018-01-27 NOTE — Telephone Encounter (Signed)
Referral sent to scheduling from Dr. Gaynelle Adu, [facility], [phone]

## 2018-03-03 ENCOUNTER — Ambulatory Visit: Payer: BC Managed Care – PPO | Admitting: Skilled Nursing Facility1

## 2018-03-05 ENCOUNTER — Institutional Professional Consult (permissible substitution): Payer: Self-pay | Admitting: Neurology

## 2018-03-07 ENCOUNTER — Ambulatory Visit (HOSPITAL_COMMUNITY)
Admission: RE | Admit: 2018-03-07 | Discharge: 2018-03-07 | Disposition: A | Discharge status: Home / Self Care | Payer: BC Managed Care – PPO | Source: Ambulatory Visit | Attending: General Surgery | Admitting: General Surgery

## 2018-03-24 ENCOUNTER — Encounter: Payer: BC Managed Care – PPO | Attending: General Surgery | Admitting: Registered"

## 2018-03-24 DIAGNOSIS — Z713 Dietary counseling and surveillance: Secondary | ICD-10-CM | POA: Insufficient documentation

## 2018-03-24 DIAGNOSIS — E669 Obesity, unspecified: Secondary | ICD-10-CM

## 2018-03-25 NOTE — Progress Notes (Signed)
  Pre-Operative Nutrition Class:  Appt start time: 8:15   End time:  9:15.  Patient was seen on 03/24/2018 for Pre-Operative Bariatric Surgery Education at [facility].   Surgery date: TBD Surgery type: Sleeve Start weight at [facility]: 323.2 Weight today: 337.9   Samples given per MNT protocol. Patient educated on appropriate usage: Bariatric Advantage Multivitamin Lot # [id number] Exp: 06/2019  Bariatric Advantage Calcium Citrate Lot # [id number] Exp: 10/01/2019  Bariatric Advantage Calcium Citrate Lot # [id number] Exp: 01/09/2019  Premier Protein Shake Lot # [id number] Exp: 12/17/2018    The following the learning objectives were met by the patient during this course:  Identify Pre-Op Dietary Goals and will begin 2 weeks pre-operatively  Identify appropriate sources of fluids and proteins   State protein recommendations and appropriate sources pre and post-operatively  Identify Post-Operative Dietary Goals and will follow for 2 weeks post-operatively  Identify appropriate multivitamin and calcium sources  Describe the need for physical activity post-operatively and will follow MD recommendations  State when to call healthcare provider regarding medication questions or post-operative complications  Handouts given during class include:  Pre-Op Bariatric Surgery Diet Handout  Protein Shake Handout  Post-Op Bariatric Surgery Nutrition Handout  [facility] Program Information Flyer  Support Group Information Flyer  [facility] Bariatric Supplements Price List  Follow-Up Plan: Patient will follow-up at [facility] 2 weeks post operatively for diet advancement per MD.

## 2018-03-26 ENCOUNTER — Encounter

## 2018-03-26 ENCOUNTER — Ambulatory Visit: Payer: BC Managed Care – PPO | Admitting: Neurology

## 2018-03-26 ENCOUNTER — Encounter: Payer: Self-pay | Admitting: Neurology

## 2018-03-26 VITALS — BP 185/97 | HR 99 | Ht 64.0 in | Wt 338.0 lb

## 2018-03-26 DIAGNOSIS — R51 Headache: Secondary | ICD-10-CM

## 2018-03-26 DIAGNOSIS — R519 Headache, unspecified: Secondary | ICD-10-CM

## 2018-03-26 DIAGNOSIS — G4719 Other hypersomnia: Secondary | ICD-10-CM

## 2018-03-26 DIAGNOSIS — G4733 Obstructive sleep apnea (adult) (pediatric): Secondary | ICD-10-CM

## 2018-03-26 DIAGNOSIS — Z6841 Body Mass Index (BMI) 40.0 and over, adult: Secondary | ICD-10-CM | POA: Diagnosis not present

## 2018-03-26 DIAGNOSIS — R351 Nocturia: Secondary | ICD-10-CM

## 2018-03-26 NOTE — Progress Notes (Signed)
Subjective:    Patient ID: Vanessa Wells is a 52 y.o. female.  HPI     Huston Foley, MD, PhD [facility] [address] [address] [place], [place] [address]  Dear Dr. Andrey Campanile,   I saw your patient, Vanessa Wells, upon your kind request in my neurologic clinic today for initial consultation of her sleep disorder, in particular, reevaluation for her prior diagnosis of OSA. The patient is unaccompanied today. As you know, Vanessa Wells with an underlying medical history of borderline diabetes, hypertension, hyperlipidemia and morbid obesity with a BMI of over 55, who is pursuing bariatric surgery in particular, gastric sleeve. She was previously diagnosed several years ago with sleep apnea and placed on CPAP therapy. She has not used her CPAP in at least a year. The CPAP compliance download is not possible from her machine due to age of the machine. She does not currently have a DME company. She does report daytime somnolence, her Epworth sleepiness score is 16 out of 24, fatigue score is 53 out of 63. She has no FHx of OSA. 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 Bedtime varies, generally between 11 and midnight, rise time around 5:30. She does not always wake up rested. She does not have trouble going to sleep but has sleep disruption particularly from having to go to the bathroom. She has nocturia at least 2-3 times per average night and reports frequent morning headaches. She was originally diagnosed with sleep apnea over 10 years ago, she has had interim sleep studies, last one over 5 years ago and she still has her original CPAP machine. She has had lower extremity swelling. She is a nonsmoker and does not utilize alcohol and does not drink caffeine on a daily basis. She lives alone, single, has 2 grown daughters, ages 4 and 30. She has 2 grandsons. She does have a TV in the  bedroom and feels like she has to rely to fall asleep. She lost her mother last year at age 74 from heart disease and renal failure.   Her Past Medical History Is Significant For: Past Medical History:  Diagnosis Date  . Anxiety   . Diabetes mellitus    borderline  . Hypercholesterolemia   . Hypertension   . Obesity     Her Past Surgical History Is Significant For: Past Surgical History:  Procedure Laterality Date  . ABDOMINAL HYSTERECTOMY    . CHOLECYSTECTOMY      Her Family History Is Significant For: Family History  Problem Relation Age of Onset  . Cancer Other   . Hypertension Other   . Stroke Other   . Diabetes Other     Her Social History Is Significant For: Social History   Socioeconomic History  . Marital status: Single    Spouse name: Not on file  . Number of children: Not on file  . Years of education: Not on file  . Highest education level: Not on file  Occupational History  . Not on file  Social Needs  . Financial resource strain: Not on file  . Food insecurity:    Worry: Often true    Inability: Sometimes true  . Transportation needs:    Medical: Not on file    Non-medical: Not on file  Tobacco Use  . Smoking status: Never Smoker  . Smokeless tobacco: Never Used  Substance and Sexual Activity  .  Alcohol use: No  . Drug use: No  . Sexual activity: Not on file  Lifestyle  . Physical activity:    Days per week: Not on file    Minutes per session: Not on file  . Stress: Not on file  Relationships  . Social connections:    Talks on phone: Not on file    Gets together: Not on file    Attends religious service: Not on file    Active member of club or organization: Not on file    Attends meetings of clubs or organizations: Not on file    Relationship status: Not on file  Other Topics Concern  . Not on file  Social History Narrative  . Not on file    Her Allergies Are:  Allergies  Allergen Reactions  . Niaspan [Niacin] Swelling     Burning sensation  :   Her Current Medications Are:  Outpatient Encounter Medications as of 03/26/2018  Medication Sig  . telmisartan-hydrochlorothiazide (MICARDIS HCT) 40-12.5 MG tablet Take 1 tablet by mouth daily.  . Vortioxetine HBr (TRINTELLIX PO) Take by mouth.  . [DISCONTINUED] amoxicillin (AMOXIL) 500 MG capsule Take 1 capsule (500 mg total) by mouth 3 (three) times daily. (Patient not taking: Reported on 01/24/2018)  . [DISCONTINUED] azithromycin (ZITHROMAX Z-PAK) 250 MG tablet Take 1 tablet (250 mg total) by mouth daily. 2 pills (500mg ) day 1 then 1 pill (250mg ) days 2-5. (Patient not taking: Reported on 01/24/2018)  . [DISCONTINUED] sertraline (ZOLOFT) 100 MG tablet Take 100 mg by mouth daily.  . [DISCONTINUED] telmisartan (MICARDIS) 20 MG tablet Take 20 mg by mouth daily.   No facility-administered encounter medications on file as of 03/26/2018.   :  Review of Systems:  Out of a complete 14 point review of systems, all are reviewed and negative with the exception of these symptoms as listed below: Review of Systems  Neurological:       Pt presents today to discuss her sleep. Pt has a cpap but has not used it in over one year. Pt's cpap is unable to be downloaded at this office due to its age. Pt has had sleep studies completed over 5 years ago. Pt has gained weight. Pt is attempting to have bariatric surgery. Pt does not have a DME.  Epworth Sleepiness Scale 0= would never doze 1= slight chance of dozing 2= moderate chance of dozing 3= high chance of dozing  Sitting and reading: 2 Watching TV: 2 Sitting inactive in a public place (ex. Theater or meeting): 1 As a passenger in a car for an hour without a break: 3 Lying down to rest in the afternoon: 3 Sitting and talking to someone: 1 Sitting quietly after lunch (no alcohol): 3 In a car, while stopped in traffic: 1 Total: 16     Objective:  Neurological Exam  Physical Exam Physical Examination:   Vitals:    03/26/18 0900  BP: (!) 185/97  Pulse: 99    General Examination: The patient is a very pleasant 52 y.o. female in no acute distress. She appears well-developed and well-nourished and well groomed.   HEENT: Normocephalic, atraumatic, pupils are equal, round and reactive to light and accommodation. Extraocular tracking is good without limitation to gaze excursion or nystagmus noted. Normal smooth pursuit is noted. Hearing is grossly intact. Face is symmetric with normal facial animation and normal facial sensation. Speech is clear with no dysarthria noted. There is no hypophonia. There is no lip, neck/head, jaw or voice tremor.  Neck is supple with full range of passive and active motion. There are no carotid bruits on auscultation. Oropharynx exam reveals: mild mouth dryness, good dental hygiene and moderate airway crowding, due to smaller airway entry, tonsils in place. Mallampati is class II. Tongue protrudes centrally and palate elevates symmetrically. Tonsils are 1+ in size. Neck size is 18 1/8 inches. She has a Mild overbite. Nasal inspection reveals no significant nasal mucosal bogginess or redness and no septal deviation.   Chest: Clear to auscultation without wheezing, rhonchi or crackles noted.  Heart: S1+S2+0, regular and normal without murmurs, rubs or gallops noted.   Abdomen: Soft, non-tender and non-distended with normal bowel sounds appreciated on auscultation.  Extremities: There is trace pitting edema in the distal lower extremities bilaterally, L more noticeable.   Skin: Warm and dry without trophic changes noted.  Musculoskeletal: exam reveals no obvious joint deformities, tenderness or joint swelling or erythema.   Neurologically:  Mental status: The patient is awake, alert and oriented in all 4 spheres. Her immediate and remote memory, attention, language skills and fund of knowledge are appropriate. There is no evidence of aphasia, agnosia, apraxia or anomia. Speech is clear  with normal prosody and enunciation. Thought process is linear. Mood is normal and affect is normal.  Cranial nerves II - XII are as described above under HEENT exam. In addition: shoulder shrug is normal with equal shoulder height noted. Motor exam: Normal bulk, strength and tone is noted. There is no drift, tremor or rebound. Romberg is negative. Reflexes are 1+ in UEs and trace LEs. Fine motor skills and coordination: grossly intact.  Cerebellar testing: No dysmetria or intention tremor. There is no truncal or gait ataxia.  Sensory exam: intact to light touch in the upper and lower extremities.  Gait, station and balance: She stands easily. No veering to one side is noted. No leaning to one side is noted. Posture is age-appropriate and stance is narrow based. Gait shows normal stride length and normal pace. No problems turning are noted.                Assessment and Plan:  In summary, Vanessa Wells is a very pleasant 52 y.o.-year old female with an underlying medical history of borderline diabetes, hypertension, hyperlipidemia and morbid obesity, whose history and physical exam are in keeping with obstructive sleep apnea (OSA). I had a long chat with the patient about my findings and the diagnosis of OSA, its prognosis and treatment options. We talked about medical treatments, surgical interventions and non-pharmacological approaches. I explained in particular the risks and ramifications of untreated moderate to severe OSA, especially with respect to developing cardiovascular disease down the Road, including congestive heart failure, difficult to treat hypertension, cardiac arrhythmias, or stroke. Even type 2 diabetes has, in part, been linked to untreated OSA. Symptoms of untreated OSA include daytime sleepiness, memory problems, mood irritability and mood disorder such as depression and anxiety, lack of energy, as well as recurrent headaches, especially morning headaches. We talked about trying to  maintain a healthy lifestyle in general, as well as the importance of weight control. I encouraged the patient to eat healthy, exercise daily and keep well hydrated, to keep a scheduled bedtime and wake time routine, to not skip any meals and eat healthy snacks in between meals. I advised the patient not to drive when feeling sleepy. I recommended the following at this time: sleep study with potential positive airway pressure titration. (We will score hypopneas at 3%).  I explained the sleep test procedure to the patient and also outlined possible surgical and non-surgical treatment options of OSA, including the use of a custom-made dental device (which would require a referral to a specialist dentist or oral surgeon), upper airway surgical options, such as pillar implants, radiofrequency surgery, tongue base surgery, and UPPP (which would involve a referral to an ENT surgeon). Rarely, jaw surgery such as mandibular advancement may be considered.  I also explained the CPAP treatment option to the patient, who indicated that she would be willing to get back on CPAP if the need arises. She would qualify for new machine and equipment at this point. I explained the importance of being compliant with PAP treatment, not only for insurance purposes but primarily to improve Her symptoms, and for the patient's long term health benefit, including to reduce Her cardiovascular risks. I answered all her questions today and the patient was in agreement. I would like to see her back after the sleep study is completed and encouraged her to call with any interim questions, concerns, problems or updates.   Thank you very much for allowing me to participate in the care of this nice patient. If I can be of any further assistance to you please do not hesitate to call me at [phone].  Sincerely,   Huston Foley, MD, PhD

## 2018-03-26 NOTE — Patient Instructions (Signed)

## 2018-04-06 ENCOUNTER — Ambulatory Visit (INDEPENDENT_AMBULATORY_CARE_PROVIDER_SITE_OTHER): Payer: BC Managed Care – PPO | Admitting: Neurology

## 2018-04-06 DIAGNOSIS — G4733 Obstructive sleep apnea (adult) (pediatric): Secondary | ICD-10-CM | POA: Diagnosis not present

## 2018-04-06 DIAGNOSIS — R351 Nocturia: Secondary | ICD-10-CM

## 2018-04-06 DIAGNOSIS — R51 Headache: Secondary | ICD-10-CM

## 2018-04-06 DIAGNOSIS — Z6841 Body Mass Index (BMI) 40.0 and over, adult: Secondary | ICD-10-CM

## 2018-04-06 DIAGNOSIS — G4719 Other hypersomnia: Secondary | ICD-10-CM

## 2018-04-06 DIAGNOSIS — R519 Headache, unspecified: Secondary | ICD-10-CM

## 2018-04-09 ENCOUNTER — Telehealth: Payer: Self-pay

## 2018-04-09 NOTE — Progress Notes (Signed)
Patient referred by Dr. Andrey CampanileWilson, seen by me on 03/26/18, split night sleep study on 04/06/18. Please call and notify patient that the recent sleep study confirmed the diagnosis of severe OSA. She did well with CPAP during the study with significant improvement of the respiratory events. Therefore, I would like start the patient on CPAP therapy at home by prescribing a machine for home use. I placed the order in the chart.  Please advise patient that we need a follow up appointment with either myself or one of our nurse practitioners in about 10 weeks post set-up to check for how the patient is feeling and how well the patient is using the machine, etc. Please go ahead and schedule the appointment, while you have the patient on the phone and make sure patient understands the importance of keeping this window for the FU appointment, as it is often an insurance requirement. Failing to adhere to this may result in losing coverage for sleep apnea treatment, at which point most patients are left with a choice of returning the machine or paying out of pocket (and we want neither of this to happen!).  Please re-enforce the importance of compliance with treatment and the need for us to monitor compliance data - again an insurance requirement and usually a good feedback for the patient as far as how they are doing.  Also remind patient, that any PAP machine or mask issues should be first addressed with the DME company, who provided the machine/mask.  Please ask if patient has a preference regarding DME company, may depend on the insurance too.  Please arrange for CPAP set up at home through a DME company of patient's choice.  Once you have spoken to the patient you can close the phone encounter. Please fax/route report to referring provider, thanks,   Huston FoleySaima Payton Prinsen, MD, PhD [facility]

## 2018-04-09 NOTE — Telephone Encounter (Signed)
-----   Message from Huston FoleySaima Athar, MD sent at 04/09/2018  7:54 AM EDT ----- Patient referred by Dr. Andrey CampanileWilson, seen by me on 03/26/18, split night sleep study on 04/06/18. Please call and notify patient that the recent sleep study confirmed the diagnosis of severe OSA. She did well with CPAP during the study with significant improvement of the respiratory events. Therefore, I would like start the patient on CPAP therapy at home by prescribing a machine for home use. I placed the order in the chart.  Please advise patient that we need a follow up appointment with either myself or one of our nurse practitioners in about 10 weeks post set-up to check for how the patient is feeling and how well the patient is using the machine, etc. Please go ahead and schedule the appointment, while you have the patient on the phone and make sure patient understands the importance of keeping this window for the FU appointment, as it is often an insurance requirement. Failing to adhere to this may result in losing coverage for sleep apnea treatment, at which point most patients are left with a choice of returning the machine or paying out of pocket (and we want neither of this to happen!).  Please re-enforce the importance of compliance with treatment and the need for us to monitor compliance data - again an insurance requirement and usually a good feedback for the patient as far as how they are doing.  Also remind patient, that any PAP machine or mask issues should be first addressed with the DME company, who provided the machine/mask.  Please ask if patient has a preference regarding DME company, may depend on the insurance too.  Please arrange for CPAP set up at home through a DME company of patient's choice.  Once you have spoken to the patient you can close the phone encounter. Please fax/route report to referring provider, thanks,   Huston FoleySaima Athar, MD, PhD [facility]

## 2018-04-09 NOTE — Telephone Encounter (Signed)
I called pt to discuss her sleep study results. No answer, left a message asking her to call me back. 

## 2018-04-09 NOTE — Addendum Note (Signed)
Addended by: Huston FoleyATHAR, Malajah Oceguera on: 04/09/2018 07:54 AM   Modules accepted: Orders

## 2018-04-09 NOTE — Procedures (Signed)
PATIENT'S NAME:  Vanessa Wells, Vanessa Wells DOB:      [birth year]      MR#:    [id number]     DATE OF RECORDING: 04/06/2018 REFERRING Wells.D.:  Gaynelle AduEric Pineda, MD Study Performed:  Split-Night Titration Study HISTORY: 52 year old woman with a history of borderline diabetes, hypertension, hyperlipidemia and morbid obesity, who presents for re-evaluation of her OSA. She no longer uses CPAP. The patient endorsed the Epworth Sleepiness Scale at 16/24 points. The patient's weight 338 pounds with a height of 64 (inches), resulting in a BMI of 57.6 kg/m2. The patient's neck circumference measured 18.1 inches.  CURRENT MEDICATIONS: Telmisartan-Hydrochlorothiazide and Vortioxetine.  PROCEDURE:  This is a multichannel digital polysomnogram utilizing the Somnostar 11.2 system.  Electrodes and sensors were applied and monitored per AASM Specifications.   EEG, EOG, Chin and Limb EMG, were sampled at 200 Hz.  ECG, Snore and Nasal Pressure, Thermal Airflow, Respiratory Effort, CPAP Flow and Pressure, Oximetry was sampled at 50 Hz. Digital video and audio were recorded.      BASELINE STUDY WITHOUT CPAP RESULTS:  Lights Out was at 22:23 and Lights On at 05:00 for the night, split start at 01:34, epoch 391. Total recording time (TRT) was 190, with a total sleep time (TST) of 141 minutes.   The patient's sleep latency was 48.5 minutes, which is delayed. REM latency was 117 minutes.  The sleep efficiency was 74.2 %.    SLEEP ARCHITECTURE: WASO (Wake after sleep onset) was 0.5 minutes, Stage N1 was 2 minutes, Stage N2 was 114.5 minutes, Stage N3 was 0 minutes and Stage R (REM sleep) was 24.5 minutes.  The percentages were Stage N1 1.4%, Stage N2 81.2%, which is markedly increased, Stage N3 was absent and Stage R (REM sleep) 17.4%. The arousals were noted as: 3 were spontaneous, 0 were associated with PLMs, 146 were associated with respiratory events.  RESPIRATORY ANALYSIS:  There were a total of 146 respiratory events:  5 obstructive  apneas, 0 central apneas and 0 mixed apneas with a total of 5 apneas and an apnea index (AI) of 2.1. There were 141 hypopneas with a hypopnea index of 60. The patient also had 0 respiratory event related arousals (RERAs).  Snoring was noted.     The total APNEA/HYPOPNEA INDEX (AHI) was 62.1/hour and the total RESPIRATORY DISTURBANCE INDEX was 62.1 /hour.  24 events occurred in REM sleep and 234 events in NREM. The REM AHI was 58.8, /hour versus a non-REM AHI of 62.8 /hour. The patient spent 342.5 minutes sleep time in the supine position 0 minutes in non-supine. The supine AHI was 62.1 /hour versus a non-supine AHI of 0.0 /hour.  OXYGEN SATURATION & C02:  The wake baseline 02 saturation was 94%, with the lowest being 72%. Time spent below 89% saturation equaled 116 minutes.  PERIODIC LIMB MOVEMENTS: The patient had a total of 0 Periodic Limb Movements.  The Periodic Limb Movement (PLM) index was 0 /hour and the PLM Arousal index was 0 /hour. Audio and video analysis did not show any abnormal or unusual movements, behaviors, phonations or vocalizations. The patient took no bathroom breaks. Moderate to loud snoring was noted. The EKG was in keeping with normal sinus rhythm (NSR).   TITRATION STUDY WITH CPAP RESULTS:   The patient was fitted with P10 large pillows. CPAP was initiated at 5 cm H20 with heated humidity per AASM split night standards and pressure was advanced to 12 cmH20 because of hypopneas, apneas and desaturations.  At a  PAP pressure of 12 cmH20, there was a reduction of the AHI to 0/hour with brief supine REM sleep achieved and O2 nadir of 89%.   Total recording time (TRT) was 208 minutes, with a total sleep time (TST) of 201.5 minutes. The patient's sleep latency was 6 minutes. REM latency was 79 minutes.  The sleep efficiency was 96.9 %.    SLEEP ARCHITECTURE: Wake after sleep was 2.5 minutes, Stage N1 3 minutes, Stage N2 126 minutes, Stage N3 0 minutes and Stage R (REM sleep) 72.5  minutes. The percentages were: Stage N1 1.5%, Stage N2 62.5%, Stage N3 was absent and Stage R (REM sleep) 36.%, which is increased, and in keeping with rebound. The arousals were noted as: 15 were spontaneous, 0 were associated with PLMs, 47 were associated with respiratory events.  RESPIRATORY ANALYSIS:  There were a total of 47 respiratory events: 0 obstructive apneas, 0 central apneas and 0 mixed apneas with a total of 0 apneas and an apnea index (AI) of 0. There were 47 hypopneas with a hypopnea index of 14. /hour. The patient also had 0 respiratory event related arousals (RERAs).      The total APNEA/HYPOPNEA INDEX  (AHI) was 14. /hour and the total RESPIRATORY DISTURBANCE INDEX was 14. /hour.  32 events occurred in REM sleep and 15 events in NREM. The REM AHI was 26.5 /hour versus a non-REM AHI of 7. /hour. REM sleep was achieved on a pressure of  cm/h2o (AHI was  .) The patient spent 100% of total sleep time in the supine position. The supine AHI was 14.0 /hour, versus a non-supine AHI of 0.0/hour.  OXYGEN SATURATION & C02:  The wake baseline 02 saturation was 91%, with the lowest being 75%. Time spent below 89% saturation equaled 66 minutes.  PERIODIC LIMB MOVEMENTS: The patient had a total of 0 Periodic Limb Movements. The Periodic Limb Movement (PLM) index was 0 /hour and the PLM Arousal index was 0 /hour.  Post-study, the patient indicated that sleep was better than usual.  POLYSOMNOGRAPHY IMPRESSION :   1. Obstructive Sleep Apnea (OSA)   RECOMMENDATIONS:  1. This patient has severe obstructive sleep apnea and responded well on CPAP therapy. I will, therefore, start the patient on home CPAP treatment at a pressure of 12 cm via nasal pillows with heated humidity. The patient should be reminded to be fully compliant with PAP therapy to improve sleep related symptoms and decrease long term cardiovascular risks. Please note that untreated obstructive sleep apnea may carry additional  perioperative morbidity. Patients with significant obstructive sleep apnea should receive perioperative PAP therapy and the surgeons and particularly the anesthesiologist should be informed of the diagnosis and the severity of the sleep disordered breathing. 2. The patient should be cautioned not to drive, work at heights, or operate dangerous or heavy equipment when tired or sleepy. Review and reiteration of good sleep hygiene measures should be pursued with any patient. 3. The patient will be seen in follow-up in the sleep clinic at [facility] for discussion of the test results, symptom and treatment compliance review, further management strategies, etc. The referring provider will be notified of the test results.  I certify that I have reviewed the entire raw data recording prior to the issuance of this report in accordance with the Standards of Accreditation of the American Academy of Sleep Medicine (AASM)   Huston Foley, MD, PhD Diplomat, American Board of Neurology and Sleep Medicine (Neurology and Sleep Medicine)

## 2018-04-10 NOTE — Telephone Encounter (Signed)
I called pt. I advised pt that Dr. Frances FurbishAthar reviewed their sleep study results and found that pt has severe osa but did well with the cpap during her latest sleep study. Dr. Frances FurbishAthar recommends that pt start a new cpap at home. Pt does not have a current DME. I reviewed PAP compliance expectations with the pt. Pt is agreeable to starting a CPAP. I advised pt that an order will be sent to a DME, [facility], and [facility] will call the pt within about one week after they file with the pt's insurance. [facility] will show the pt how to use the machine, fit for masks, and troubleshoot the CPAP if needed. A follow up appt was made for insurance purposes with Dr. Frances FurbishAthar on 07/07/18 at 10:30am. Pt verbalized understanding to arrive 15 minutes early and bring their CPAP. A letter with all of this information in it will be mailed to the pt as a reminder. I verified with the pt that the address we have on file is correct. Pt verbalized understanding of results. Pt had no questions at this time but was encouraged to call back if questions arise.

## 2018-05-27 ENCOUNTER — Encounter: Payer: Self-pay | Admitting: Neurology

## 2018-07-07 ENCOUNTER — Ambulatory Visit: Payer: Self-pay | Admitting: Neurology

## 2019-07-30 ENCOUNTER — Other Ambulatory Visit: Payer: Self-pay

## 2019-07-30 DIAGNOSIS — Z20822 Contact with and (suspected) exposure to covid-19: Secondary | ICD-10-CM

## 2019-08-01 LAB — NOVEL CORONAVIRUS, NAA: SARS-CoV-2, NAA: DETECTED — AB

## 2019-10-30 IMAGING — RF DG UGI W/ KUB
10 of 12 series · 14 of 21 positions shown · non-contrast
Comparison: None.

CLINICAL DATA: Preop gastric sleeve

EXAM:
UPPER GI SERIES WITH KUB
TECHNIQUE: After obtaining a scout radiograph a routine upper GI series was
performed using thin barium
FLUOROSCOPY TIME:  Fluoroscopy Time:  1 minutes 24 seconds
Radiation Exposure Index (if provided by the fluoroscopic device):
Number of Acquired Spot Images: 0

[Series 1: t abdomen supine · 0.15mm/px · 1 of 1 slices shown]
[im 1/1]
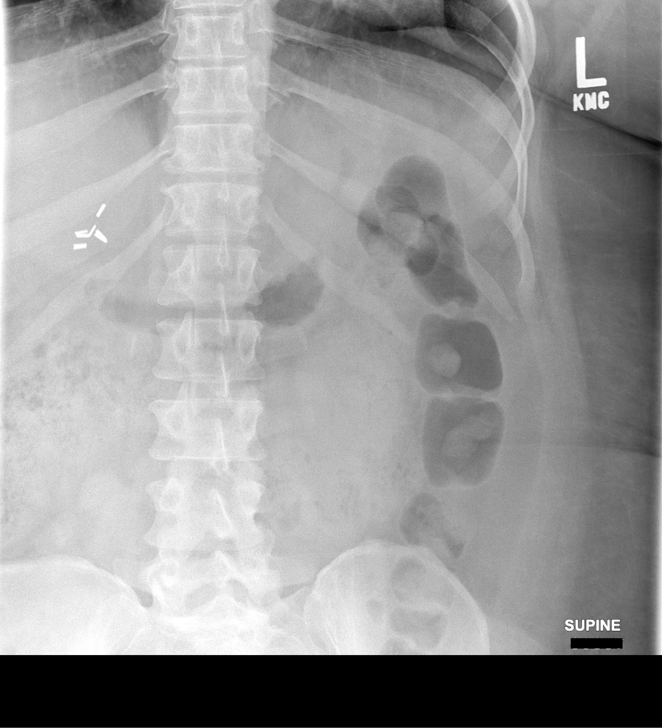

[Series 2: cp_standard · 0.38mm/px · 2 of 190 frames shown (1 of 4)]
[frame 75/190]
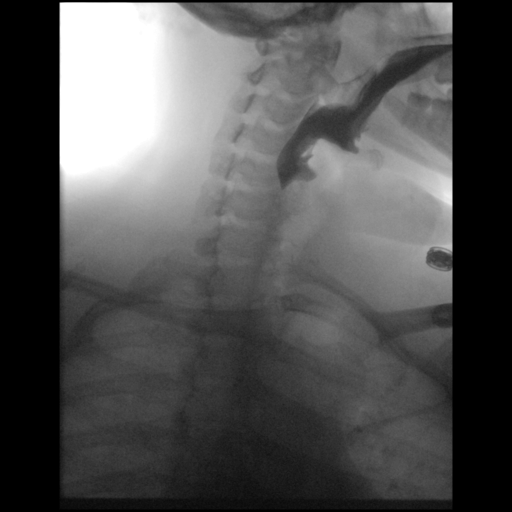
[frame 96/190]
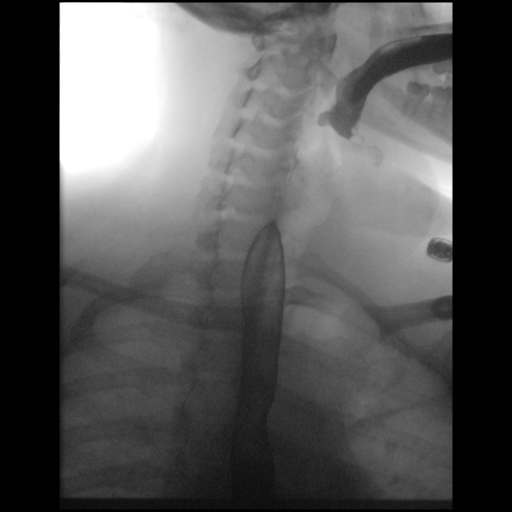

[Series 3: cp_standard · 0.37mm/px · 3 of 65 frames shown (2 of 4)]
[frame 10/65]
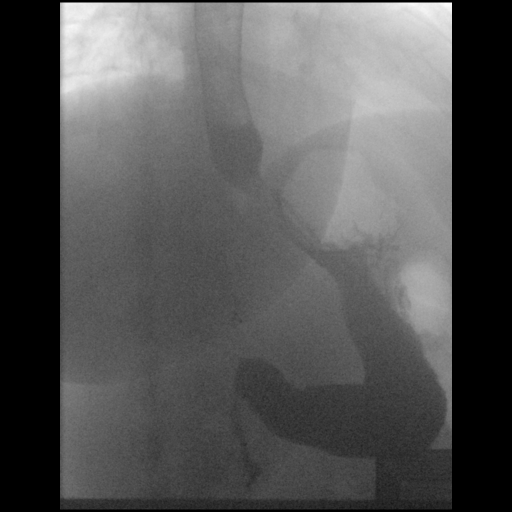
[frame 33/65]
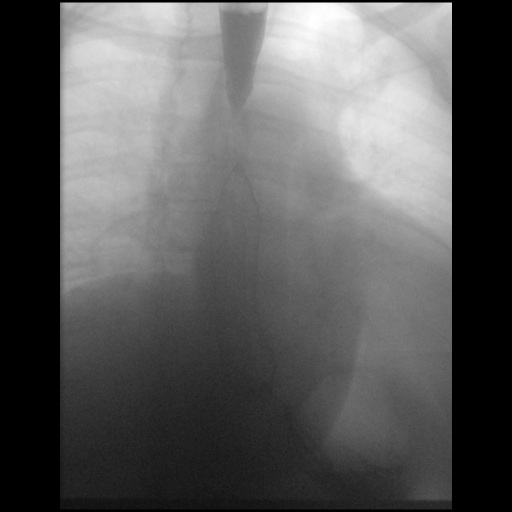
[frame 56/65]
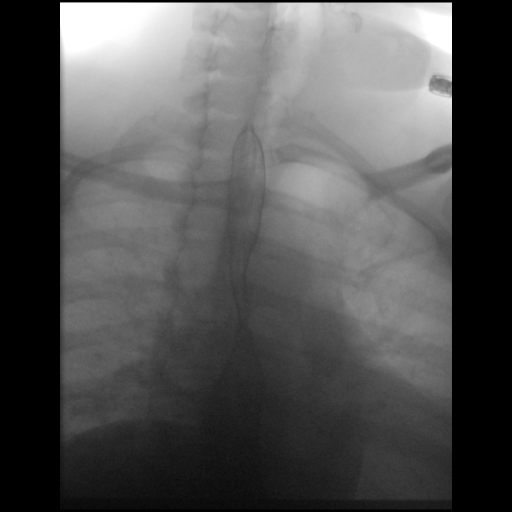

[Series 4: fluoro_barium 2fps_bw · 0.18mm/px · 1 of 1 slices shown (1 of 5)]
[im 1/1]
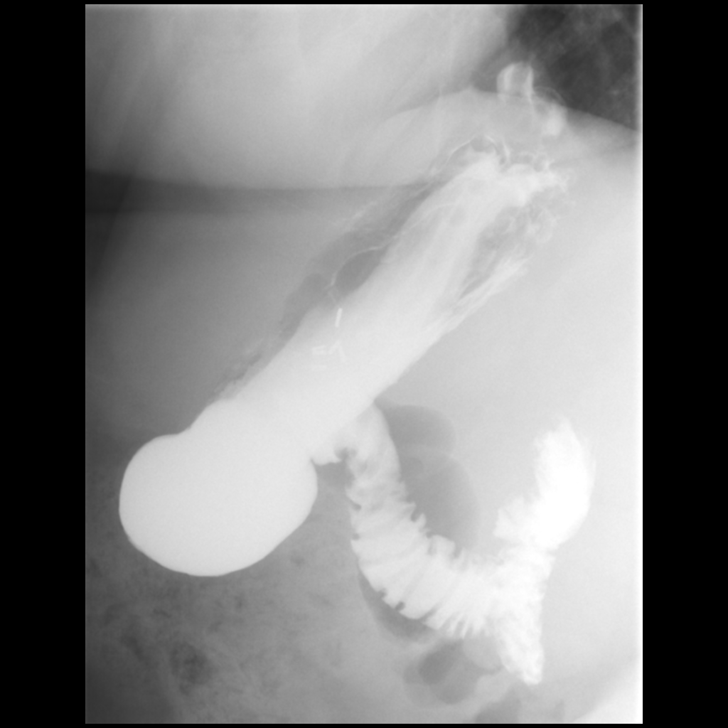

[Series 6: fluoro_barium 2fps_bw · 0.18mm/px · 1 of 1 slices shown (2 of 5)]
[im 1/1]
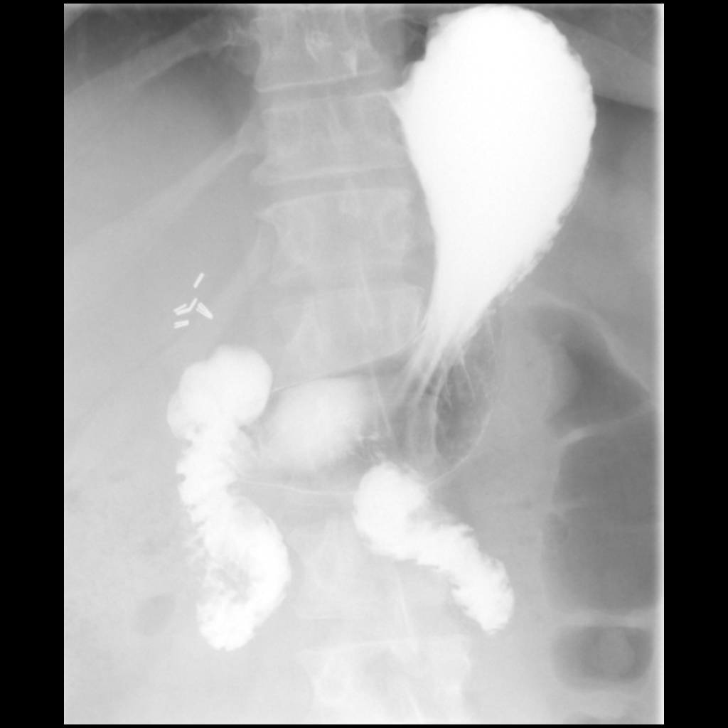

[Series 7: fluoro_barium 2fps_bw · 0.18mm/px · 1 of 1 slices shown (3 of 5)]
[im 1/1]
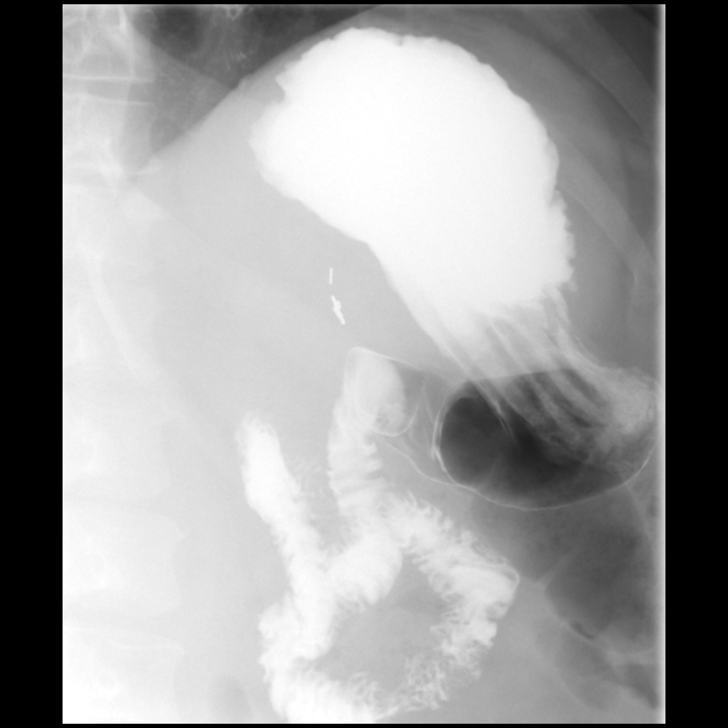

[Series 8: cp_standard · 0.35mm/px · 2 of 154 frames shown (3 of 4)]
[frame 78/154]
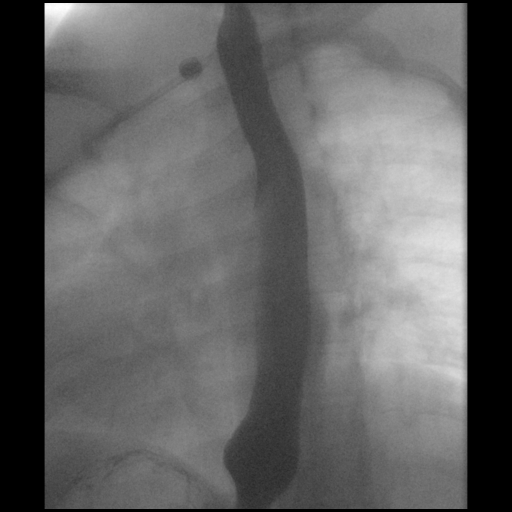
[frame 128/154]
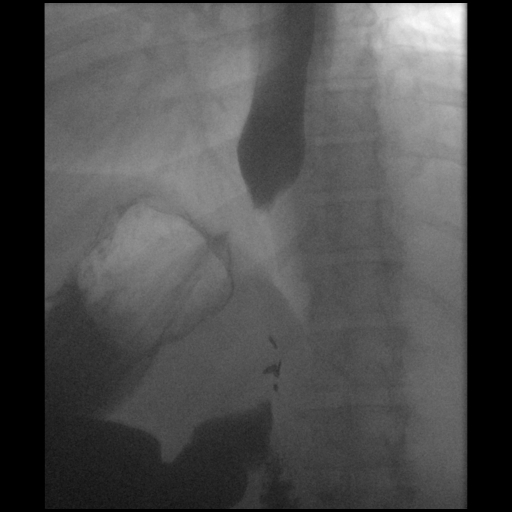

[Series 9: fluoro_barium 2fps_bw · 0.18mm/px · 1 of 1 slices shown (4 of 5)]
[im 1/1]
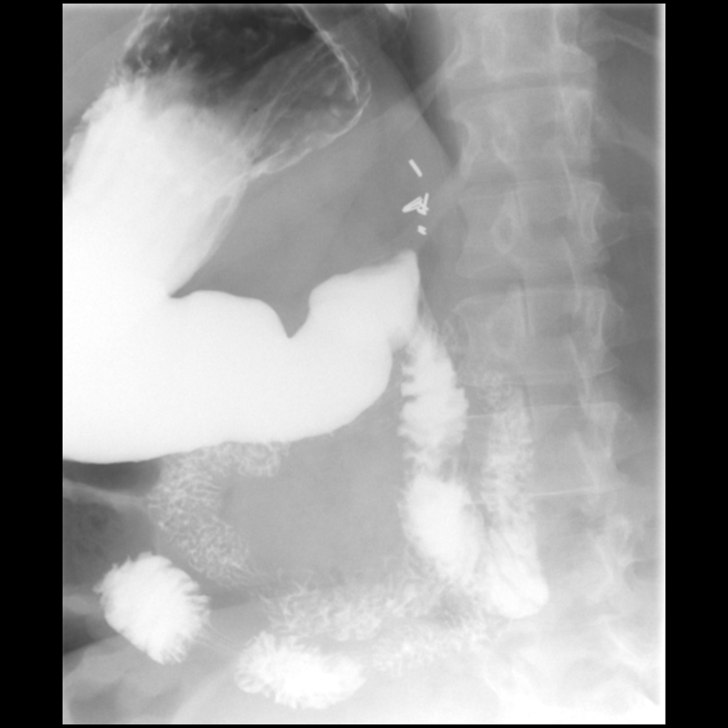

[Series 10: fluoro_barium 2fps_bw · 0.18mm/px · 1 of 1 slices shown (5 of 5)]
[im 1/1]
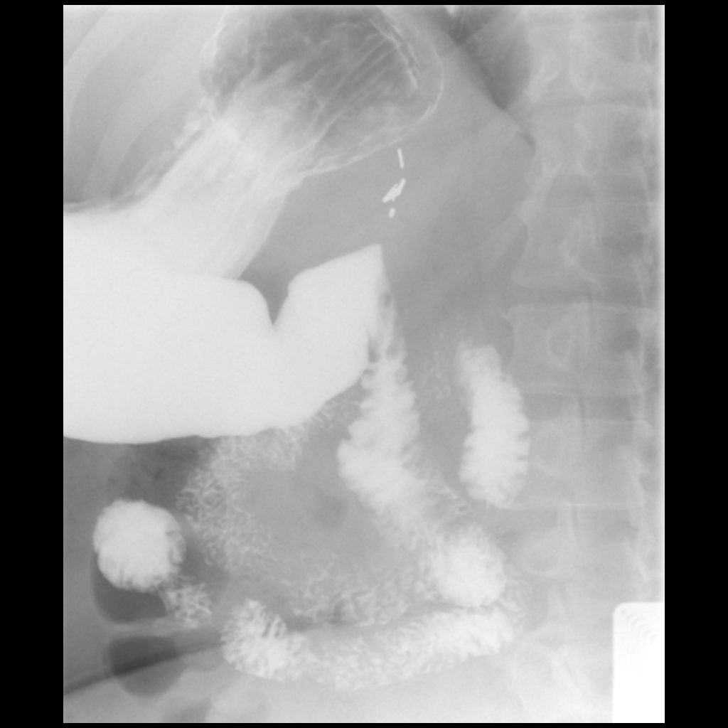

[Series 12: cp_standard · 0.18mm/px · 1 of 1 slices shown (4 of 4)]
[im 1/1]
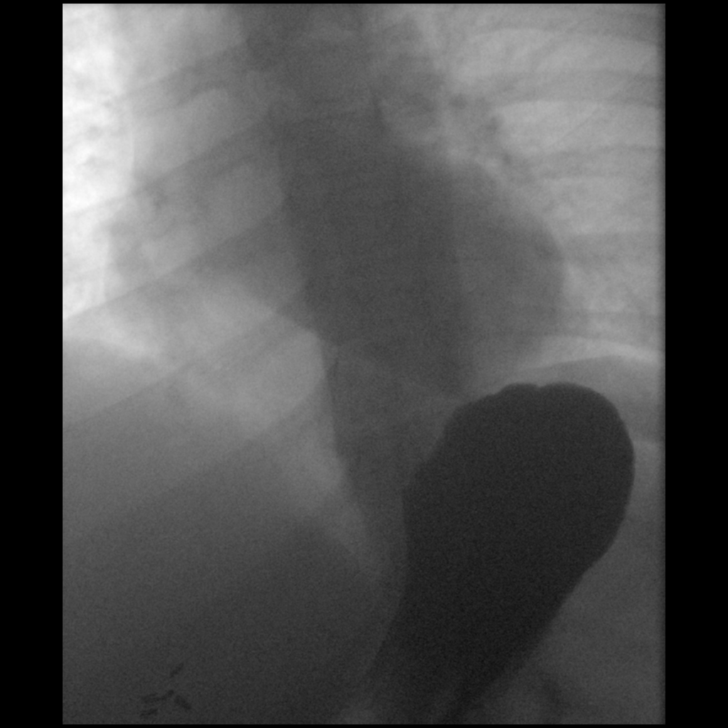

[14 of 21 positions shown; findings below may reference images not displayed]

FINDINGS: Preliminary KUB negative. Esophageal mucosa and motility normal. No
stricture or mass. Negative for hiatal hernia or reflux.

Normal stomach. No mucosal mass edema or ulcer. Normal emptying of
the stomach. Duodenal bulb is normal without ulcer or edema.
IMPRESSION: Negative upper GI

## 2021-12-06 DIAGNOSIS — F21 Schizotypal disorder: Secondary | ICD-10-CM | POA: Insufficient documentation

## 2021-12-06 DIAGNOSIS — F4010 Social phobia, unspecified: Secondary | ICD-10-CM | POA: Insufficient documentation

## 2023-11-20 ENCOUNTER — Emergency Department (HOSPITAL_BASED_OUTPATIENT_CLINIC_OR_DEPARTMENT_OTHER)

## 2023-11-20 ENCOUNTER — Encounter (HOSPITAL_BASED_OUTPATIENT_CLINIC_OR_DEPARTMENT_OTHER): Payer: Self-pay | Admitting: Emergency Medicine

## 2023-11-20 ENCOUNTER — Emergency Department (HOSPITAL_BASED_OUTPATIENT_CLINIC_OR_DEPARTMENT_OTHER)
Admission: EM | Admit: 2023-11-20 | Discharge: 2023-11-20 | Disposition: A | Discharge status: Home / Self Care | Attending: Emergency Medicine | Admitting: Emergency Medicine

## 2023-11-20 ENCOUNTER — Other Ambulatory Visit: Payer: Self-pay

## 2023-11-20 DIAGNOSIS — E119 Type 2 diabetes mellitus without complications: Secondary | ICD-10-CM | POA: Insufficient documentation

## 2023-11-20 DIAGNOSIS — R1013 Epigastric pain: Secondary | ICD-10-CM

## 2023-11-20 DIAGNOSIS — R1010 Upper abdominal pain, unspecified: Secondary | ICD-10-CM | POA: Diagnosis present

## 2023-11-20 DIAGNOSIS — Z79899 Other long term (current) drug therapy: Secondary | ICD-10-CM | POA: Insufficient documentation

## 2023-11-20 DIAGNOSIS — E871 Hypo-osmolality and hyponatremia: Secondary | ICD-10-CM | POA: Diagnosis not present

## 2023-11-20 DIAGNOSIS — R112 Nausea with vomiting, unspecified: Secondary | ICD-10-CM | POA: Insufficient documentation

## 2023-11-20 DIAGNOSIS — I10 Essential (primary) hypertension: Secondary | ICD-10-CM | POA: Diagnosis not present

## 2023-11-20 DIAGNOSIS — K5900 Constipation, unspecified: Secondary | ICD-10-CM | POA: Diagnosis not present

## 2023-11-20 LAB — CBC WITH DIFFERENTIAL/PLATELET
Abs Immature Granulocytes: 0 10*3/uL (ref 0.00–0.07)
Basophils Absolute: 0 10*3/uL (ref 0.0–0.1)
Basophils Relative: 0 %
Eosinophils Absolute: 0 10*3/uL (ref 0.0–0.5)
Eosinophils Relative: 1 %
HCT: 41.5 % (ref 36.0–46.0)
Hemoglobin: 14.4 g/dL (ref 12.0–15.0)
Immature Granulocytes: 0 %
Lymphocytes Relative: 29 %
Lymphs Abs: 1.9 10*3/uL (ref 0.7–4.0)
MCH: 30.6 pg (ref 26.0–34.0)
MCHC: 34.7 g/dL (ref 30.0–36.0)
MCV: 88.1 fL (ref 80.0–100.0)
Monocytes Absolute: 0.6 10*3/uL (ref 0.1–1.0)
Monocytes Relative: 9 %
Neutro Abs: 4 10*3/uL (ref 1.7–7.7)
Neutrophils Relative %: 61 %
Platelets: 376 10*3/uL (ref 150–400)
RBC: 4.71 MIL/uL (ref 3.87–5.11)
RDW: 11.9 % (ref 11.5–15.5)
WBC: 6.5 10*3/uL (ref 4.0–10.5)
nRBC: 0 % (ref 0.0–0.2)

## 2023-11-20 LAB — COMPREHENSIVE METABOLIC PANEL
ALT: 20 U/L (ref 0–44)
AST: 19 U/L (ref 15–41)
Albumin: 4.8 g/dL (ref 3.5–5.0)
Alkaline Phosphatase: 57 U/L (ref 38–126)
Anion gap: 13 (ref 5–15)
BUN: 11 mg/dL (ref 6–20)
CO2: 22 mmol/L (ref 22–32)
Calcium: 9.7 mg/dL (ref 8.9–10.3)
Chloride: 96 mmol/L — ABNORMAL LOW (ref 98–111)
Creatinine, Ser: 0.65 mg/dL (ref 0.44–1.00)
GFR, Estimated: >60 mL/min (ref >60)
Glucose, Bld: 113 mg/dL — ABNORMAL HIGH (ref 70–99)
Potassium: 4 mmol/L (ref 3.5–5.1)
Sodium: 131 mmol/L — ABNORMAL LOW (ref 135–145)
Total Bilirubin: 1.2 mg/dL (ref 0.0–1.2)
Total Protein: 8.4 g/dL — ABNORMAL HIGH (ref 6.5–8.1)

## 2023-11-20 LAB — URINALYSIS, ROUTINE W REFLEX MICROSCOPIC
Glucose, UA: NEGATIVE mg/dL
Hgb urine dipstick: NEGATIVE
Ketones, ur: 40 mg/dL — AB
Leukocytes,Ua: NEGATIVE
Nitrite: NEGATIVE
Protein, ur: 100 mg/dL — AB
Specific Gravity, Urine: >=1.03 (ref 1.005–1.030)
pH: 6 (ref 5.0–8.0)

## 2023-11-20 LAB — LIPASE, BLOOD: Lipase: 23 U/L (ref 11–51)

## 2023-11-20 LAB — URINALYSIS, MICROSCOPIC (REFLEX): WBC, UA: NONE SEEN WBC/hpf (ref 0–5)

## 2023-11-20 MED ORDER — MORPHINE SULFATE (PF) 4 MG/ML IV SOLN
4.0000 mg | Freq: Once | INTRAVENOUS | Status: AC
  Administered 2023-11-20: 4 mg via INTRAVENOUS
  Filled 2023-11-20: qty 1

## 2023-11-20 MED ORDER — ONDANSETRON HCL 4 MG/2ML IJ SOLN
4.0000 mg | Freq: Four times a day (QID) | INTRAMUSCULAR | Status: DC | PRN
  Administered 2023-11-20: 4 mg via INTRAVENOUS
  Filled 2023-11-20: qty 2

## 2023-11-20 MED ORDER — IOHEXOL 300 MG/ML  SOLN
100.0000 mL | Freq: Once | INTRAMUSCULAR | Status: AC | PRN
  Administered 2023-11-20: 100 mL via INTRAVENOUS

## 2023-11-20 MED ORDER — SODIUM CHLORIDE 0.9 % IV BOLUS
1000.0000 mL | Freq: Once | INTRAVENOUS | Status: AC
  Administered 2023-11-20: 1000 mL via INTRAVENOUS

## 2023-11-20 MED ORDER — METOCLOPRAMIDE HCL 10 MG PO TABS: 10.0000 mg | ORAL_TABLET | Freq: Four times a day (QID) | ORAL | 0 refills | Status: DC

## 2023-11-20 NOTE — Discharge Instructions (Signed)
 Thank you for coming to [facility] Emergency Department. You were seen for abdominal pain, constipation. We did an exam, labs, and imaging, and these showed constipation. Please take 1-2 capfuls of miralax per day in addition to your 100 mg of docusate sodium twice per day. Please stay well hydrated. Please take reglan 10 mg every 6 hours as needed for nausea/vomiting.   Please follow up with your primary care provider within 1 week.   Do not hesitate to return to the ED or call 911 if you experience: -Worsening symptoms -Nausea/vomiting so severe you cannot eat/drink at home -Lightheadedness, passing out -Fevers/chills -Anything else that concerns you

## 2023-11-20 NOTE — ED Triage Notes (Signed)
 Pt c/o upper abd pain, constipation; hx of gastric bypass in Oct; +NV

## 2023-11-20 NOTE — ED Provider Notes (Signed)
 [place] EMERGENCY DEPARTMENT AT [facility] Provider Note   CSN: [id number] Arrival date & time: 11/20/23  1457     History {Add pertinent medical, surgical, social history, OB history to HPI:1} Chief Complaint  Patient presents with   Abdominal Pain   Constipation    Vanessa Wells is a 58 y.o. 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 Has tried docusate sodium at home with coffee with no relief. +N/V that started two days ago as well. Hasn't had really anything to eat and throwing up phlegm. No fevers, hematemesis, chest pain/SOB. No urinary sxs, vaginal sxs. Also has h/o open cholecystectomy.    Past Medical History:  Diagnosis Date   Anxiety    Diabetes mellitus    borderline   Hypercholesterolemia    Hypertension    Obesity        Home Medications Prior to Admission medications   Medication Sig Start Date End Date Taking? Authorizing Provider  telmisartan-hydrochlorothiazide (MICARDIS HCT) 40-12.5 MG tablet Take 1 tablet by mouth daily.    [provider]  Vortioxetine HBr (TRINTELLIX PO) Take by mouth.    [provider]      Allergies    Niaspan [niacin]    Review of Systems   Review of Systems A 10 point review of systems was performed and is negative unless otherwise reported in HPI.  Physical Exam Updated Vital Signs BP (!) 149/102   Pulse 84   Temp 99.4 F (37.4 C) (Oral)   Resp 18   Ht 5\' 4"  (1.626 m)   Wt 72.1 kg   SpO2 100%   BMI 27.29 kg/m  Physical Exam General: Normal appearing female, lying in bed.  HEENT: PERRLA, Sclera anicteric, MMM, trachea midline.  Cardiology: RRR, no murmurs/rubs/gallops. BL radial and DP pulses equal bilaterally.  Resp: Normal respiratory rate and effort. CTAB, no wheezes, rhonchi, crackles.  Abd: Soft, +epigastric/central/LUQ/RUQ, non-distended. No rebound tenderness  or guarding.  GU: Deferred. MSK: No peripheral edema or signs of trauma. Extremities without deformity or TTP. No cyanosis or clubbing. Skin: warm, dry. No rashes or lesions. Back: No CVA tenderness Neuro: A&Ox4, CNs II-XII grossly intact. MAEs. Sensation grossly intact.  Psych: Normal mood and affect.   ED Results / Procedures / Treatments   Labs (all labs ordered are listed, but only abnormal results are displayed) Labs Reviewed  COMPREHENSIVE METABOLIC PANEL - Abnormal; Notable for the following components:      Result Value   Sodium 131 (*)    Chloride 96 (*)    Glucose, Bld 113 (*)    Total Protein 8.4 (*)    All other components within normal limits  URINALYSIS, ROUTINE W REFLEX MICROSCOPIC - Abnormal; Notable for the following components:   Bilirubin Urine SMALL (*)    Ketones, ur 40 (*)    Protein, ur 100 (*)    All other components within normal limits  URINALYSIS, MICROSCOPIC (REFLEX) - Abnormal; Notable for the following components:   Bacteria, UA RARE (*)    All other components within normal limits  LIPASE, BLOOD  CBC WITH DIFFERENTIAL/PLATELET    EKG None  Radiology No results found.  Procedures Procedures  {Document cardiac monitor, telemetry assessment procedure when appropriate:1}  Medications Ordered in ED Medications  morphine (PF) 4 MG/ML injection 4 mg (has no administration in time range)  ondansetron (ZOFRAN)  injection 4 mg (has no administration in time range)  sodium chloride 0.9 % bolus 1,000 mL (1,000 mLs Intravenous New Bag/Given 11/20/23 1844)    ED Course/ Medical Decision Making/ A&P                          Medical Decision Making Amount and/or Complexity of Data Reviewed Labs: ordered. Radiology: ordered.  Risk Prescription drug management.    This patient presents to the ED for concern of abd pain, constipation, N/V, this involves an extensive number of treatment options, and is a complaint that carries with it a high risk  of complications and morbidity.  I considered the following differential and admission for this acute, potentially life threatening condition.   MDM:    For DDX for abdominal pain includes but is not limited to:  Abdominal exam without peritoneal signs. No evidence of acute abdomen at this time.   Low suspicion for acute hepatobiliary disease (including acute cholecystitis or cholangitis), acute pancreatitis (neg lipase), PUD (including gastric perforation), acute appendicitis, bowel obstruction,diverticulitis.    Clinical Course as of 11/20/23 1940  Wed Nov 20, 2023  1815 CBC, CMP, lipase only remarkable for mild hyponatremia.  [HN]    Clinical Course User Index [HN] Loetta Rough, MD    Labs: I Ordered, and personally interpreted labs.  The pertinent results include:  those listed above  Imaging Studies ordered: I ordered imaging studies including CT abd pelvis I independently visualized and interpreted imaging. I agree with the radiologist interpretation  Additional history obtained from chart reivew.    Reevaluation: After the interventions noted above, I reevaluated the patient and found that they have :improved  Social Determinants of Health: Lives independently  Disposition:  ***  Co morbidities that complicate the patient evaluation  Past Medical History:  Diagnosis Date   Anxiety    Diabetes mellitus    borderline   Hypercholesterolemia    Hypertension    Obesity      Medicines Meds ordered this encounter  Medications   sodium chloride 0.9 % bolus 1,000 mL   morphine (PF) 4 MG/ML injection 4 mg   ondansetron (ZOFRAN) injection 4 mg    I have reviewed the patients home medicines and have made adjustments as needed  Problem List / ED Course: Problem List Items Addressed This Visit   None        {Document critical care time when appropriate:1} {Document review of labs and clinical decision tools ie heart score, Chads2Vasc2 etc:1}  {Document  your independent review of radiology images, and any outside records:1} {Document your discussion with family members, caretakers, and with consultants:1} {Document social determinants of health affecting pt's care:1} {Document your decision making why or why not admission, treatments were needed:1}  This note was created using dictation software, which may contain spelling or grammatical errors.

## 2023-12-06 ENCOUNTER — Encounter (HOSPITAL_COMMUNITY): Payer: Self-pay

## 2023-12-06 ENCOUNTER — Inpatient Hospital Stay (HOSPITAL_COMMUNITY)
Admission: EM | Admit: 2023-12-06 | Discharge: 2023-12-16 | DRG: 377 | Disposition: A | Discharge status: Home / Self Care | Attending: Internal Medicine | Admitting: Internal Medicine

## 2023-12-06 ENCOUNTER — Other Ambulatory Visit: Payer: Self-pay

## 2023-12-06 DIAGNOSIS — K922 Gastrointestinal hemorrhage, unspecified: Secondary | ICD-10-CM | POA: Diagnosis present

## 2023-12-06 DIAGNOSIS — E7800 Pure hypercholesterolemia, unspecified: Secondary | ICD-10-CM | POA: Diagnosis present

## 2023-12-06 DIAGNOSIS — K284 Chronic or unspecified gastrojejunal ulcer with hemorrhage: Principal | ICD-10-CM | POA: Diagnosis present

## 2023-12-06 DIAGNOSIS — K5900 Constipation, unspecified: Secondary | ICD-10-CM | POA: Diagnosis present

## 2023-12-06 DIAGNOSIS — Z79899 Other long term (current) drug therapy: Secondary | ICD-10-CM

## 2023-12-06 DIAGNOSIS — Z90710 Acquired absence of both cervix and uterus: Secondary | ICD-10-CM

## 2023-12-06 DIAGNOSIS — Z833 Family history of diabetes mellitus: Secondary | ICD-10-CM

## 2023-12-06 DIAGNOSIS — Z955 Presence of coronary angioplasty implant and graft: Secondary | ICD-10-CM

## 2023-12-06 DIAGNOSIS — I440 Atrioventricular block, first degree: Secondary | ICD-10-CM | POA: Diagnosis present

## 2023-12-06 DIAGNOSIS — F39 Unspecified mood [affective] disorder: Secondary | ICD-10-CM | POA: Diagnosis present

## 2023-12-06 DIAGNOSIS — F419 Anxiety disorder, unspecified: Secondary | ICD-10-CM | POA: Diagnosis present

## 2023-12-06 DIAGNOSIS — K289 Gastrojejunal ulcer, unspecified as acute or chronic, without hemorrhage or perforation: Secondary | ICD-10-CM

## 2023-12-06 DIAGNOSIS — Z823 Family history of stroke: Secondary | ICD-10-CM

## 2023-12-06 DIAGNOSIS — I959 Hypotension, unspecified: Secondary | ICD-10-CM | POA: Diagnosis present

## 2023-12-06 DIAGNOSIS — K921 Melena: Principal | ICD-10-CM | POA: Insufficient documentation

## 2023-12-06 DIAGNOSIS — D75839 Thrombocytosis, unspecified: Secondary | ICD-10-CM | POA: Diagnosis present

## 2023-12-06 DIAGNOSIS — T45525A Adverse effect of antithrombotic drugs, initial encounter: Secondary | ICD-10-CM | POA: Diagnosis present

## 2023-12-06 DIAGNOSIS — I272 Pulmonary hypertension, unspecified: Secondary | ICD-10-CM | POA: Diagnosis present

## 2023-12-06 DIAGNOSIS — Z7902 Long term (current) use of antithrombotics/antiplatelets: Secondary | ICD-10-CM

## 2023-12-06 DIAGNOSIS — Z8249 Family history of ischemic heart disease and other diseases of the circulatory system: Secondary | ICD-10-CM

## 2023-12-06 DIAGNOSIS — I10 Essential (primary) hypertension: Secondary | ICD-10-CM | POA: Diagnosis present

## 2023-12-06 DIAGNOSIS — Z7982 Long term (current) use of aspirin: Secondary | ICD-10-CM

## 2023-12-06 DIAGNOSIS — D62 Acute posthemorrhagic anemia: Secondary | ICD-10-CM | POA: Diagnosis present

## 2023-12-06 DIAGNOSIS — I2109 ST elevation (STEMI) myocardial infarction involving other coronary artery of anterior wall: Secondary | ICD-10-CM | POA: Diagnosis present

## 2023-12-06 DIAGNOSIS — K219 Gastro-esophageal reflux disease without esophagitis: Secondary | ICD-10-CM | POA: Diagnosis present

## 2023-12-06 DIAGNOSIS — I2510 Atherosclerotic heart disease of native coronary artery without angina pectoris: Secondary | ICD-10-CM | POA: Diagnosis present

## 2023-12-06 LAB — CBC
HCT: 21.6 % — ABNORMAL LOW (ref 36.0–46.0)
Hemoglobin: 7 g/dL — ABNORMAL LOW (ref 12.0–15.0)
MCH: 30.4 pg (ref 26.0–34.0)
MCHC: 32.4 g/dL (ref 30.0–36.0)
MCV: 93.9 fL (ref 80.0–100.0)
Platelets: 608 10*3/uL — ABNORMAL HIGH (ref 150–400)
RBC: 2.3 MIL/uL — ABNORMAL LOW (ref 3.87–5.11)
RDW: 13.2 % (ref 11.5–15.5)
WBC: 9.8 10*3/uL (ref 4.0–10.5)
nRBC: 0.2 % (ref 0.0–0.2)

## 2023-12-06 LAB — I-STAT CHEM 8, ED
BUN: 12 mg/dL (ref 6–20)
Calcium, Ion: 1.17 mmol/L (ref 1.15–1.40)
Chloride: 106 mmol/L (ref 98–111)
Creatinine, Ser: 0.5 mg/dL (ref 0.44–1.00)
Glucose, Bld: 107 mg/dL — ABNORMAL HIGH (ref 70–99)
HCT: 22 % — ABNORMAL LOW (ref 36.0–46.0)
Hemoglobin: 7.5 g/dL — ABNORMAL LOW (ref 12.0–15.0)
Potassium: 3.7 mmol/L (ref 3.5–5.1)
Sodium: 143 mmol/L (ref 135–145)
TCO2: 24 mmol/L (ref 22–32)

## 2023-12-06 LAB — BASIC METABOLIC PANEL WITH GFR
Anion gap: 9 (ref 5–15)
BUN: 11 mg/dL (ref 6–20)
CO2: 25 mmol/L (ref 22–32)
Calcium: 8.8 mg/dL — ABNORMAL LOW (ref 8.9–10.3)
Chloride: 106 mmol/L (ref 98–111)
Creatinine, Ser: 0.55 mg/dL (ref 0.44–1.00)
GFR, Estimated: >60 mL/min (ref >60)
Glucose, Bld: 111 mg/dL — ABNORMAL HIGH (ref 70–99)
Potassium: 3.6 mmol/L (ref 3.5–5.1)
Sodium: 140 mmol/L (ref 135–145)

## 2023-12-06 LAB — CBG MONITORING, ED: Glucose-Capillary: 92 mg/dL (ref 70–99)

## 2023-12-06 LAB — PROTIME-INR
INR: 1.1 (ref 0.8–1.2)
Prothrombin Time: 14.7 s (ref 11.4–15.2)

## 2023-12-06 LAB — ABO/RH: ABO/RH(D): B POS

## 2023-12-06 LAB — I-STAT CG4 LACTIC ACID, ED: Lactic Acid, Venous: 1.1 mmol/L (ref 0.5–1.9)

## 2023-12-06 LAB — PREPARE RBC (CROSSMATCH)

## 2023-12-06 LAB — LIPASE, BLOOD: Lipase: 19 U/L (ref 11–51)

## 2023-12-06 MED ORDER — PANTOPRAZOLE SODIUM 40 MG IV SOLR
80.0000 mg | Freq: Once | INTRAVENOUS | Status: AC
  Administered 2023-12-06: 80 mg via INTRAVENOUS
  Filled 2023-12-06: qty 20

## 2023-12-06 MED ORDER — SODIUM CHLORIDE 0.9% IV SOLUTION: Freq: Once | INTRAVENOUS | Status: DC

## 2023-12-06 NOTE — ED Provider Triage Note (Signed)
 Emergency Medicine Provider Triage Evaluation Note  Vanessa Wells , a 58 y.o. female  was evaluated in triage.  Pt complains of abdominal pain, lightheadedness, black stools.  Recent hospitalization with MI, three-vessel stent placement.  Patient also had severe constipation.  She is on medication to make her bowel movement.  She has been losing control of her bowels with black stools.  Has had a 10 pound weight loss since her MI.  Review of Systems  Positive: Near syncope, abd pain  Negative: fever  Physical Exam  BP 113/71 (BP Location: Right Arm)   Pulse 93   Temp 99.2 F (37.3 C) (Oral)   Resp 14   Ht 5\' 4"  (1.626 m)   Wt 63.5 kg   SpO2 99%   BMI 24.03 kg/m  Gen:   Awake, no distress   Resp:  Normal effort  MSK:   Moves extremities without difficulty  Other:  TTP abd  Medical Decision Making  Medically screening exam initiated at 6:19 PM.  Appropriate orders placed.  Vanessa Wells was informed that the remainder of the evaluation will be completed by another provider, this initial triage assessment does not replace that evaluation, and the importance of remaining in the ED until their evaluation is complete.     Arthor Captain, PA-C 12/06/23 [phone]

## 2023-12-06 NOTE — ED Notes (Signed)
 blood bank called; antibody screen positive; will call when blood products are ready, but it may be delayed.

## 2023-12-06 NOTE — ED Provider Notes (Signed)
 [place] EMERGENCY DEPARTMENT AT [facility] Provider Note   CSN: [id number] Arrival date & time: 12/06/23  1731     History Chief Complaint  Patient presents with   Weakness    HPI Vanessa Wells is a 58 y.o. female presenting for generalized weakness, malaise, fatigue. Recently admitted for a percent LAD blockage over at [facility].  Since discharge has been having fatigue weakness and melena. Today she had a near syncope event comes in for further care and management. She is ambulatory though with relatively severe lightheadedness.  Having epigastric abdominal pain and cramping relatively frequently. Denies fevers or chills, vomiting, syncope or shortness of breath at this time..   Patient's recorded medical, surgical, social, medication list and allergies were reviewed in the Snapshot window as part of the initial history.   Review of Systems   Review of Systems  Constitutional:  Positive for fatigue. Negative for chills and fever.  HENT:  Negative for ear pain and sore throat.   Eyes:  Negative for pain and visual disturbance.  Respiratory:  Negative for cough and shortness of breath.   Cardiovascular:  Negative for chest pain and palpitations.  Gastrointestinal:  Positive for blood in stool. Negative for abdominal pain and vomiting.  Genitourinary:  Negative for dysuria and hematuria.  Musculoskeletal:  Negative for arthralgias and back pain.  Skin:  Positive for color change. Negative for rash.  Neurological:  Positive for weakness. Negative for seizures and syncope.  All other systems reviewed and are negative.   Physical Exam Updated Vital Signs BP 113/71   Pulse 89   Temp 98.7 F (37.1 C)   Resp 15   Ht 5\' 4"  (1.626 m)   Wt 63.5 kg   SpO2 99%   BMI 24.03 kg/m  Physical Exam Constitutional:      General: She is not in acute distress.    Appearance: She is not ill-appearing or toxic-appearing.  HENT:     Head:  Normocephalic and atraumatic.  Eyes:     Extraocular Movements: Extraocular movements intact.     Pupils: Pupils are equal, round, and reactive to light.  Cardiovascular:     Rate and Rhythm: Normal rate.  Pulmonary:     Effort: No respiratory distress.  Abdominal:     General: Abdomen is flat.  Musculoskeletal:        General: No swelling, deformity or signs of injury.     Cervical back: Normal range of motion. No rigidity.  Skin:    General: Skin is warm and dry.  Neurological:     General: No focal deficit present.     Mental Status: She is alert and oriented to person, place, and time.  Psychiatric:        Mood and Affect: Mood normal.      ED Course/ Medical Decision Making/ A&P    Procedures .Critical Care  Performed by: Glyn Ade, MD Authorized by: Glyn Ade, MD   Critical care provider statement:    Critical care time (minutes):  30   Critical care was necessary to treat or prevent imminent or life-threatening deterioration of the following conditions:  Metabolic crisis and circulatory failure   Critical care was time spent personally by me on the following activities:  Development of treatment plan with patient or surrogate, discussions with consultants, evaluation of patient's response to treatment, examination of patient, ordering and review of laboratory studies, ordering and review of radiographic studies, ordering and performing treatments  and interventions, pulse oximetry, re-evaluation of patient's condition and review of old charts   Care discussed with: admitting provider      Medications Ordered in ED Medications  0.9 %  sodium chloride infusion (Manually program via Guardrails IV Fluids) (has no administration in time range)  pantoprazole (PROTONIX) injection 80 mg (80 mg Intravenous Given 12/06/23 2118)    Medical Decision Making:   This a 58 year old female presenting with acute complaint of melena. Denies fevers chills nausea  vomiting syncope shortness of breath. She has a hemoglobin of 7.0 in the setting of recent cardiac procedure. She consented to transfusion here in the ER.  I suspect this is likely from her recent initiation of ticagrelor (BRILINTA) 90 mg tablet and aspirin cotherapy in the setting of recent STEMI.  Consulted gastroenterology for further recommendations Reassessment: Dr. Elnoria Howard with gastroenterology was consulted.  He agreed with PPI, transfusion and admission to medicine they will see in the AM. Disposition:   Based on the above findings, I believe this patient is stable for admission.    Patient/family educated about specific findings on our evaluation and explained exact reasons for admission.  Patient/family educated about clinical situation and time was allowed to answer questions.   Admission team communicated with and agreed with need for admission. Patient admitted. Patient ready to move at this time.     Emergency Department Medication Summary:   Medications  0.9 %  sodium chloride infusion (Manually program via Guardrails IV Fluids) (has no administration in time range)  pantoprazole (PROTONIX) injection 80 mg (80 mg Intravenous Given 12/06/23 2118)        Clinical Impression:  1. Melena      Admit   Final Clinical Impression(s) / ED Diagnoses Final diagnoses:  Melena    Rx / DC Orders ED Discharge Orders     None         Glyn Ade, MD 12/07/23 0009

## 2023-12-06 NOTE — ED Triage Notes (Addendum)
 Pt to ED Cc/o generalized weakness from PCP office. Reports feeling this way  since being discharged from hospital 2 weeks ago, evaluated last week for the same and told was dehydrated. Pt here today because not feeling better.   Pt denies chest pain.   Pt recently discharged from [facility] (  2 weeks ago)  for STEMI.

## 2023-12-06 NOTE — ED Notes (Signed)
 Assisted pt to the bathroom but she was unable to urinate.

## 2023-12-06 NOTE — ED Notes (Signed)
 This NT did pts Orthostatic VS and when pt stood up to get standing bp she stated "I feel like I'm going to pass out." RN notified and BP charted.

## 2023-12-07 DIAGNOSIS — Z90710 Acquired absence of both cervix and uterus: Secondary | ICD-10-CM | POA: Diagnosis not present

## 2023-12-07 DIAGNOSIS — Z955 Presence of coronary angioplasty implant and graft: Secondary | ICD-10-CM | POA: Diagnosis not present

## 2023-12-07 DIAGNOSIS — F419 Anxiety disorder, unspecified: Secondary | ICD-10-CM | POA: Diagnosis present

## 2023-12-07 DIAGNOSIS — I2510 Atherosclerotic heart disease of native coronary artery without angina pectoris: Secondary | ICD-10-CM

## 2023-12-07 DIAGNOSIS — F39 Unspecified mood [affective] disorder: Secondary | ICD-10-CM | POA: Diagnosis present

## 2023-12-07 DIAGNOSIS — D62 Acute posthemorrhagic anemia: Secondary | ICD-10-CM

## 2023-12-07 DIAGNOSIS — Z9861 Coronary angioplasty status: Secondary | ICD-10-CM

## 2023-12-07 DIAGNOSIS — G4733 Obstructive sleep apnea (adult) (pediatric): Secondary | ICD-10-CM | POA: Diagnosis not present

## 2023-12-07 DIAGNOSIS — Z79899 Other long term (current) drug therapy: Secondary | ICD-10-CM | POA: Diagnosis not present

## 2023-12-07 DIAGNOSIS — I3139 Other pericardial effusion (noninflammatory): Secondary | ICD-10-CM

## 2023-12-07 DIAGNOSIS — Z823 Family history of stroke: Secondary | ICD-10-CM | POA: Diagnosis not present

## 2023-12-07 DIAGNOSIS — Z9884 Bariatric surgery status: Secondary | ICD-10-CM | POA: Diagnosis not present

## 2023-12-07 DIAGNOSIS — I440 Atrioventricular block, first degree: Secondary | ICD-10-CM | POA: Diagnosis present

## 2023-12-07 DIAGNOSIS — E7800 Pure hypercholesterolemia, unspecified: Secondary | ICD-10-CM | POA: Diagnosis present

## 2023-12-07 DIAGNOSIS — Z833 Family history of diabetes mellitus: Secondary | ICD-10-CM | POA: Diagnosis not present

## 2023-12-07 DIAGNOSIS — K921 Melena: Secondary | ICD-10-CM | POA: Diagnosis present

## 2023-12-07 DIAGNOSIS — K5900 Constipation, unspecified: Secondary | ICD-10-CM | POA: Diagnosis present

## 2023-12-07 DIAGNOSIS — Z8249 Family history of ischemic heart disease and other diseases of the circulatory system: Secondary | ICD-10-CM | POA: Diagnosis not present

## 2023-12-07 DIAGNOSIS — K922 Gastrointestinal hemorrhage, unspecified: Secondary | ICD-10-CM

## 2023-12-07 DIAGNOSIS — D75839 Thrombocytosis, unspecified: Secondary | ICD-10-CM | POA: Diagnosis present

## 2023-12-07 DIAGNOSIS — I10 Essential (primary) hypertension: Secondary | ICD-10-CM | POA: Diagnosis not present

## 2023-12-07 DIAGNOSIS — K219 Gastro-esophageal reflux disease without esophagitis: Secondary | ICD-10-CM | POA: Diagnosis present

## 2023-12-07 DIAGNOSIS — K284 Chronic or unspecified gastrojejunal ulcer with hemorrhage: Secondary | ICD-10-CM | POA: Diagnosis not present

## 2023-12-07 DIAGNOSIS — T45525A Adverse effect of antithrombotic drugs, initial encounter: Secondary | ICD-10-CM | POA: Diagnosis present

## 2023-12-07 DIAGNOSIS — I2109 ST elevation (STEMI) myocardial infarction involving other coronary artery of anterior wall: Secondary | ICD-10-CM | POA: Diagnosis present

## 2023-12-07 DIAGNOSIS — R1010 Upper abdominal pain, unspecified: Secondary | ICD-10-CM | POA: Diagnosis not present

## 2023-12-07 DIAGNOSIS — I272 Pulmonary hypertension, unspecified: Secondary | ICD-10-CM | POA: Diagnosis present

## 2023-12-07 DIAGNOSIS — Z7982 Long term (current) use of aspirin: Secondary | ICD-10-CM | POA: Diagnosis not present

## 2023-12-07 DIAGNOSIS — Z980 Intestinal bypass and anastomosis status: Secondary | ICD-10-CM | POA: Diagnosis not present

## 2023-12-07 DIAGNOSIS — Z7902 Long term (current) use of antithrombotics/antiplatelets: Secondary | ICD-10-CM | POA: Diagnosis not present

## 2023-12-07 DIAGNOSIS — I959 Hypotension, unspecified: Secondary | ICD-10-CM | POA: Diagnosis present

## 2023-12-07 DIAGNOSIS — K289 Gastrojejunal ulcer, unspecified as acute or chronic, without hemorrhage or perforation: Secondary | ICD-10-CM | POA: Diagnosis not present

## 2023-12-07 LAB — CBC
HCT: 23.4 % — ABNORMAL LOW (ref 36.0–46.0)
HCT: 22.3 % — ABNORMAL LOW (ref 36.0–46.0)
Hemoglobin: 7.9 g/dL — ABNORMAL LOW (ref 12.0–15.0)
Hemoglobin: 7.5 g/dL — ABNORMAL LOW (ref 12.0–15.0)
MCH: 30.6 pg (ref 26.0–34.0)
MCH: 30.5 pg (ref 26.0–34.0)
MCHC: 33.8 g/dL (ref 30.0–36.0)
MCHC: 33.6 g/dL (ref 30.0–36.0)
MCV: 90.7 fL (ref 80.0–100.0)
MCV: 90.7 fL (ref 80.0–100.0)
Platelets: 529 10*3/uL — ABNORMAL HIGH (ref 150–400)
Platelets: 483 10*3/uL — ABNORMAL HIGH (ref 150–400)
RBC: 2.58 MIL/uL — ABNORMAL LOW (ref 3.87–5.11)
RBC: 2.46 MIL/uL — ABNORMAL LOW (ref 3.87–5.11)
RDW: 14.4 % (ref 11.5–15.5)
RDW: 14.6 % (ref 11.5–15.5)
WBC: 10.7 10*3/uL — ABNORMAL HIGH (ref 4.0–10.5)
WBC: 9.3 10*3/uL (ref 4.0–10.5)
nRBC: 0 % (ref 0.0–0.2)
nRBC: 0 % (ref 0.0–0.2)

## 2023-12-07 LAB — URINALYSIS, ROUTINE W REFLEX MICROSCOPIC
Bacteria, UA: NONE SEEN
Bilirubin Urine: NEGATIVE
Glucose, UA: NEGATIVE mg/dL
Hgb urine dipstick: NEGATIVE
Ketones, ur: 20 mg/dL — AB
Leukocytes,Ua: NEGATIVE
Nitrite: NEGATIVE
Protein, ur: 30 mg/dL — AB
Specific Gravity, Urine: 1.029 (ref 1.005–1.030)
pH: 5 (ref 5.0–8.0)

## 2023-12-07 LAB — HEPATIC FUNCTION PANEL
ALT: 12 U/L (ref 0–44)
AST: 17 U/L (ref 15–41)
Albumin: 3.4 g/dL — ABNORMAL LOW (ref 3.5–5.0)
Alkaline Phosphatase: 46 U/L (ref 38–126)
Bilirubin, Direct: 0.1 mg/dL (ref 0.0–0.2)
Indirect Bilirubin: 0.5 mg/dL (ref 0.3–0.9)
Total Bilirubin: 0.6 mg/dL (ref 0.0–1.2)
Total Protein: 6.1 g/dL — ABNORMAL LOW (ref 6.5–8.1)

## 2023-12-07 LAB — PREPARE RBC (CROSSMATCH)

## 2023-12-07 LAB — TROPONIN I (HIGH SENSITIVITY): Troponin I (High Sensitivity): 16 ng/L (ref <18)

## 2023-12-07 LAB — HIV ANTIBODY (ROUTINE TESTING W REFLEX): HIV Screen 4th Generation wRfx: NONREACTIVE

## 2023-12-07 MED ORDER — TICAGRELOR 90 MG PO TABS
90.0000 mg | ORAL_TABLET | Freq: Two times a day (BID) | ORAL | Status: DC
  Administered 2023-12-07 – 2023-12-11 (×8): 90 mg via ORAL
  Filled 2023-12-07 (×8): qty 1

## 2023-12-07 MED ORDER — SODIUM CHLORIDE 0.9% IV SOLUTION: Freq: Once | INTRAVENOUS | Status: AC

## 2023-12-07 MED ORDER — PANTOPRAZOLE SODIUM 40 MG IV SOLR
40.0000 mg | Freq: Two times a day (BID) | INTRAVENOUS | Status: DC
  Administered 2023-12-07 – 2023-12-16 (×19): 40 mg via INTRAVENOUS
  Filled 2023-12-07 (×19): qty 10

## 2023-12-07 MED ORDER — ASPIRIN 81 MG PO CHEW
81.0000 mg | CHEWABLE_TABLET | Freq: Every day | ORAL | Status: DC
  Administered 2023-12-07 – 2023-12-09 (×3): 81 mg via ORAL
  Filled 2023-12-07 (×3): qty 1

## 2023-12-07 NOTE — Progress Notes (Signed)
 Arrived to 5W room [phone] from Emergency Department.  Placed on cardiac monitor and vitals obtained.  Family at bedside.  Oriented to room and unit routine.  Updated Vanessa Wells on plan of care.    12/07/23 1131  Vitals  Temp 99.5 F (37.5 C)  Temp Source Oral  BP 118/67  MAP (mmHg) 80  BP Location Right Arm  BP Method Automatic  Patient Position (if appropriate) Lying  Pulse Rate 83  Pulse Rate Source Monitor  ECG Heart Rate 80  Resp 11  Level of Consciousness  Level of Consciousness Alert  MEWS COLOR  MEWS Score Color Green  Oxygen Therapy  SpO2 100 %  O2 Device Room Air  Pain Assessment  Pain Scale 0-10  Pain Score 0  ECG Monitoring  PR interval 0.16  QRS interval 0.1  QT interval 0.41  QTc interval 0.47  CV Strip Heart Rate 79  Cardiac Rhythm NSR  MEWS Score  MEWS Temp 0  MEWS Systolic 0  MEWS Pulse 0  MEWS RR 1  MEWS LOC 0  MEWS Score 1

## 2023-12-07 NOTE — Consult Note (Addendum)
 Cardiology Consultation   Patient ID: Vanessa Wells MRN: [id number]; DOB: 1966/03/17  Admit date: 12/06/2023 Date of Consult: 12/07/2023  PCP:  Renaye Rakers, MD   Vanessa Wells Providers Cardiologist:  None        Patient Profile:   Vanessa Wells is a 58 y.o. female with a hx of anterior STEMI, hypertension, hyperlipidemia, pre DM,  who is being seen 12/07/2023 for assistance with management of DAPT in the setting of GI bleeds at the request of Dr. David Stall.  History of Present Illness:   Vanessa Wells was admitted 2 weeks ago at [facility] after presenting with anterior STEMI. Chest pain started about an hour ago prior to arrival to ED, left sided, pressure and burning pain with radiations to shoulder. Also had some shortness of breath and fatigue. No prior cardiac history. EKG via EMS showed anterior STEMI and was taken emergently to [facility] for emergent Left heart catheterization. Coronary angiogram showed occlusion of the mid-distal LAD and severe proximal LAD stenosis. Loaded with Brilinta and Aggrastat. Following PTCA with 2.5 mm balloon, IVUS was performed that revealed the mid-distal LAD lesion to likely be SCAD. Given vessel occlusion, this was stented with overlapping 2.5x33 and 3.0x94mm Xience DES at nominal pressure. Post-dilated with a 2.24mm [place], followed by a 3.22mm [place], followed by a 3.24mm . Repeat angiogram showed good result. The ostial-prox LAD was stented with a 4.0x97mm Xience DES.   Per notes from [facility], "On further history, she had been having epigastric pain which was worse with lying down flat and with inspiration." Post catheterization echo showed small pericardial effusion so she was empirically treated with colchicine. Appears that her HGB trended down from 12.7->9.3 but there was not any mention of confirmed melena during her stay, no bleeding from radial access site so she was ultimately discharged.   Patient presented to the ED on 3/28 with  weakness/fatigue/near syncope with report of melena. Initial HGB 7.0 with platelets up to 608. ECG with long PR, diffuse TWI. Patient received 1 unit PRBC and was started on high dose PPI with GI and cardiology consult.   Today patient without chest pain or dyspnea. Denies any cardiac symptoms since discharging post MI. Her only symptoms have been fatigue and melena.   Past Medical History:  Diagnosis Date   Anxiety    Diabetes mellitus    borderline   Hypercholesterolemia    Hypertension    Obesity     Past Surgical History:  Procedure Laterality Date   ABDOMINAL HYSTERECTOMY     CHOLECYSTECTOMY     GASTRIC BYPASS       Home Medications:  Prior to Admission medications   Medication Sig Start Date End Date Taking? Authorizing Provider  aspirin 81 MG chewable tablet Chew 81 mg by mouth daily.   Yes [provider]  atorvastatin (LIPITOR) 80 MG tablet Take 80 mg by mouth daily.   Yes [provider]  Cholecalciferol (TRUE VITAMIN D3) 1.25 MG (50000 UT) TABS Take 50,000 Units by mouth every Sunday.   Yes [provider]  metoprolol succinate (TOPROL-XL) 50 MG 24 hr tablet Take 1 tablet by mouth daily. 12/21/16  Yes [provider]  sertraline (ZOLOFT) 100 MG tablet Take 100 mg by mouth daily. 05/20/19  Yes [provider]  ticagrelor (BRILINTA) 90 MG TABS tablet Take 90 mg by mouth 2 (two) times daily. 11/25/23  Yes [provider]  metoCLOPramide (REGLAN) 10 MG tablet Take 1 tablet (10  mg total) by mouth every 6 (six) hours. Patient not taking: Reported on 12/07/2023 11/20/23   Loetta Rough, MD    Inpatient Medications: Scheduled Meds:  sodium chloride   Intravenous Once   pantoprazole (PROTONIX) IV  40 mg Intravenous Q12H   Continuous Infusions:  PRN Meds:   Allergies:    Allergies  Allergen Reactions   Niaspan [Niacin] Hives and Swelling    Burning sensation, passed out    Social History:   Social History    Socioeconomic History   Marital status: Single    Spouse name: Not on file   Number of children: Not on file   Years of education: Not on file   Highest education level: Not on file  Occupational History   Not on file  Tobacco Use   Smoking status: Never   Smokeless tobacco: Never  Substance and Sexual Activity   Alcohol use: No   Drug use: No   Sexual activity: Not on file  Other Topics Concern   Not on file  Social History Narrative   Not on file   Social Drivers of Health   Financial Resource Strain: Not on file  Food Insecurity: Low Risk  (11/22/2023)   Received from [facility]   Hunger Vital Sign    Worried About Running Out of Food in the Last Year: Never true    Ran Out of Food in the Last Year: Never true  Transportation Needs: No Transportation Needs (11/22/2023)   Received from [facility]    In the past 12 months, has lack of reliable transportation kept you from medical appointments, meetings, work or from getting things needed for daily living? : No  Physical Activity: Not on file  Stress: Not on file  Social Connections: Not on file  Intimate Partner Violence: Not on file    Family History:    Family History  Problem Relation Age of Onset   Cancer Other    Hypertension Other    Stroke Other    Diabetes Other      ROS:  Please see the history of present illness.   All other ROS reviewed and negative.     Physical Exam/Data:   Vitals:   12/07/23 0530 12/07/23 0545 12/07/23 0600 12/07/23 0615  BP: 100/69 103/64 101/89 101/69  Pulse: 87 84 86 85  Resp: 14 18 20  (!) 21  Temp:      TempSrc:      SpO2: 100% 100% 100% 100%  Weight:      Height:        Intake/Output Summary (Last 24 hours) at 12/07/2023 0748 Last data filed at 12/07/2023 0443 Gross per 24 hour  Intake 315 ml  Output --  Net 315 ml      12/06/2023    5:48 PM 11/20/2023    3:20 PM 03/26/2018    9:00 AM  Last 3 Weights  Weight (lbs) 140 lb 159 lb 338  lb  Weight (kg) 63.504 kg 72.122 kg 153.316 kg     Body mass index is 24.03 kg/m.  General:  Well nourished, well developed, in no acute distress HEENT: normal Neck: no JVD Vascular: No carotid bruits; Distal pulses 2+ bilaterally Cardiac:  normal S1, S2; RRR; faint systolic murmur over apex Lungs:  clear to auscultation bilaterally, no wheezing, rhonchi or rales  Abd: soft, nontender, no hepatomegaly  Ext: no edema Musculoskeletal:  No deformities, BUE and BLE strength normal and equal Skin: warm  and dry  Neuro:  CNs 2-12 intact, no focal abnormalities noted Psych:  Normal affect   EKG:  The EKG was personally reviewed and demonstrates:  sinus rhythm with diffuse TWI. Telemetry:  Telemetry was personally reviewed and demonstrates:  sinus rhythm  Relevant CV Studies:  Cardiac catheterization Result Date: 11/22/2023  Ost LAD lesion is 70% stenosed, reduced to 0%. Mid LAD lesion is 100% stenosed, reduced to 0%. Procedures: Left Heart Catheterization, Coronary Angiography, Percutaneous Coronary Intervention, Intravascular Ultrasound (IVUS) Indication: STEMI Access: R-radial Catheters: EBU3.5 Guide, JR4, [place] IVUS Procedure Note: A 5/6 Fr radial sheath was inserted into the right radial artery via micropuncture technique. IA Verapamil was administered. A 0.035 J-wire and JR4 diagnostic catheter was advanced into the ascending aorta and right main was engaged. Heparin bolus was given. Coronary angiogram was performed. Next, the JR4 diagnostic catheter was removed over the 0.035 J-wire and exchanged for a EBU3.5 guide catheter and advanced into the ascending aorta and left main was engaged. Coronary angiogram was performed. The JR4 diagnostic catheter was used to cross into the AV to measure LVEDP. Pull back was performed from LV-Ao. At completion of procedure, all catheters/wires were removed. Coronary Angiography Findings: --LM: Minimal luminal irregularities --LAD: Ostial-prox 70%, mid 100%  --LCx: Minimal luminal irregularities --RCA: Minimal luminal irregularities Left Heart Catheterization Findings: --AV crossed: Yes , LVEDP: 8 mmHg --LV Gram: No Intervention: Coronary angiogram showed culprit lesion in the mLAD and pLAD. Additional Heparin given and loaded with Brilinta and Aggrastat. Inserted EBU3.5 guide and wired across lesion with a [place]. Performed PTCA with a 2.45mm compliant balloon. Next, inserted IVUS for lesion assessment and stent sizing. Selected a 2.5x43mm Xience DES and deployed at nominal pressure. Selected a 3.0x20mm Xience DES and deployed proximal to previous stent with overlap at nominal pressure. Post-dilated with a 2.64mm [place], followed by a 3.29mm [place], followed by a 3.47mm [place]. Repeat angiogram showed good result. Next, turned our attention to ostial-prox LAD lesion. Performed PTCA with a 3.54mm compliant balloon. Selected a 4.0x86mm Xience DES and deployed at nominal pressure. Repeat angiogram showed good result. ACT level at therapeutic range during case. Pre-procedural stenosis was 100% with TIMI 0 flow, and post-procedural stenosis was 0% with TIMI III flow. Comments: DAPT for 12 months. LVEDP was normal. Hemostasis: Prelude Sync band for radial artery EBL: <10 cc Specimens: none Complications: none    11/23/23 TTE  SUMMARY  There is mild concentric left ventricular hypertrophy.  The left ventricular cavity is small.  Global LV systolic function is preserved  Severe apical and mild mid anterior and anteroseptal hypokinesis.  LV ejection fraction = 55-60%.  Left ventricular filling pattern is indeterminate.  The right ventricle is normal in size and function.  There is mild to moderate tricuspid regurgitation.  Mild to moderate pulmonary hypertension.  The aortic sinus is normal size.  IVC size was normal.  There is small to moderate size pericardial effusion without clear echocardiographic evidence of  tamponade.  There is no comparison study available.  -   FINDINGS  LEFT VENTRICLE  There is mild concentric left ventricular hypertrophy. The left ventricular cavity is small. Global  LV systolic function is preserved. LV ejection fraction = 55-60%. Left ventricular filling pattern  is indeterminate. Severe apical and mild mid anterior and anteroseptal hypokinesis.  -  RIGHT VENTRICLE  The right ventricle is normal in size and function.  LEFT ATRIUM  The left atrial size is normal.  RIGHT ATRIUM  Right atrial size is  normal.  -  AORTIC VALVE  There is aortic valve sclerosis. The aortic valve opens well. There is no aortic stenosis. There is  no aortic regurgitation.  -  MITRAL VALVE  The mitral valve leaflets appear thickened, but open well. There is no mitral regurgitation noted.  -  TRICUSPID VALVE  Structurally normal tricuspid valve. There is mild to moderate tricuspid regurgitation. Estimated  right ventricular systolic pressure is 48 mmHg. Mild to moderate pulmonary hypertension.  -  PULMONIC VALVE  The pulmonic valve is not well visualized. There is no pulmonic valvular regurgitation.  -  ARTERIES  The aortic sinus is normal size.  -  VENOUS  Pulmonary venous flow pattern is normal. IVC size was normal.  -  EFFUSION  There is small to moderate size pericardial effusion.   Laboratory Data:  High Sensitivity Troponin:  No results for input(s): "TROPONINIHS" in the last 720 hours.   Chemistry Recent Labs  Lab 12/06/23 1759 12/06/23 1851  NA 140 143  K 3.6 3.7  CL 106 106  CO2 25  --   GLUCOSE 111* 107*  BUN 11 12  CREATININE 0.55 0.50  CALCIUM 8.8*  --   GFRNONAA >60  --   ANIONGAP 9  --     Recent Labs  Lab 12/07/23 0639  PROT 6.1*  ALBUMIN 3.4*  AST 17  ALT 12  ALKPHOS 46  BILITOT 0.6   Lipids No results for input(s): "CHOL", "TRIG", "HDL", "LABVLDL", "LDLCALC", "CHOLHDL" in the last 168 hours.  Hematology Recent Labs  Lab 12/06/23 1759 12/06/23 1851 12/07/23 0639  WBC 9.8  --  10.7*  RBC  2.30*  --  2.58*  HGB 7.0* 7.5* 7.9*  HCT 21.6* 22.0* 23.4*  MCV 93.9  --  90.7  MCH 30.4  --  30.6  MCHC 32.4  --  33.8  RDW 13.2  --  14.4  PLT 608*  --  529*   Thyroid No results for input(s): "TSH", "FREET4" in the last 168 hours.  BNPNo results for input(s): "BNP", "PROBNP" in the last 168 hours.  DDimer No results for input(s): "DDIMER" in the last 168 hours.   Radiology/Studies:  No results found.   Assessment and Plan:   CAD s/p anterior STEMI Patient underwent extensive PCI at [facility] on 3/14 with 3 overlapping Xience DES, total of 74mm stenting to her LAD, complicated by distal SCAD. She was loaded on Brilinta/ASA, now admitted with GI bleed and acute anemia. Per note review from [facility], HGB drop noted even prior to d/c. Suspect that she may have already been experiencing upper GI bleeding at that time. Unable to visualize ECG tracings from [facility], unclear if TWI acute or chronic following recent STEMI. Negative troponin despite acute anemia is reassuring. Due to the extensive nature of her stenting as well as proximity to event, needs to be maintained on DAPT. Recommend continuing with ASA/Brilinta alongside high dose PPI. Patient will need EGD with GI. If patient were to have worsened and/or recurrent bleeding, could consider transition from Brilinta to Plavix but from cardiac perspective with age 58, Brilinta preferred.  Recommend targeting HGB >8. Continue Metoprolol Succinate 50mg  Continue ASA 81mg  Continue Atorvastatin 80mg   Pericardial effusion Patient noted with small-moderate pericardial effusion post PCI at [facility], was started on Colchicine given epigastric pain that was thought to be pericardial. Since discharging, patient denies any cardiac symptoms that would be consistent with acute pericarditis.  Reasonable to continue colchicine with follow up with her outpatient  cardiologist to determine duration.  Will need repeat TTE in outpatient setting  Per primary  team: Acute GI bleeding Thrombocytosis Hypertension Anxiety   Risk Assessment/Risk Scores:         For questions or updates, please contact [place] [facility] Please consult [email or web address] for contact info under    Signed, Perlie Gold, PA-C  12/07/2023 7:48 AM  I have seen, examined the patient, and reviewed the above assessment and plan.    HPI: DAQUANA PADDOCK is a 58 y.o. female with a hx of anterior STEMI, hypertension, hyperlipidemia, pre DM  who is being seen 12/07/2023 for assistance with management of DAPT in the setting of GI bleed at the request of Dr. David Stall.  [facility] approximately [facility] weeks ago with an anterior STEMI.  She underwent PCI to the LAD, with ultimately 3 overlapping drug-eluting stents.  She had been having epigastric discomfort during that visit and may be even preceding her visit.  Her hemoglobin had also down trended some during her hospitalization and she was discharged.  She returns to the ED with melena and symptoms associated with her GI bleed.  Hemoglobin was found to be 7.  Today, she reports feeling improved after receiving Protonix.  She denies chest pain or shortness of breath.  She has no new or acute complaints.  GEN: No acute distress.   Neck: No JVD Cardiac: Normal rate, regular rhythm Resp: Normal work of breathing Ext: No edema Psych: Normal affect   Assessment:  Unfortunately, patient has developed a GI bleed after starting DAPT. It sounds like she had symptoms prior to PCI and GI pathology was already present, now just made worse by DAPT. Given fresh nature of anterior STEMI and recent PCI with multiple overlapping stents, she would be high risk for instent thrombosis of anti-platelet therapy and with this being the LAD, there is large area of myocardium at risk.   #. CAD s/p anterior STEMI: Underwent extensive PCI on 3/14 with 3 overlapping Xience stents. #. Acute GI bleed w/ melena:   Plan:  -Recommend  patient remaining on dual antiplatelet therapy with aspirin and ticagrelor.  Patient is hemodynamically stable in setting of her bleed.  For now would continue with supportive care with IV fluids, Protonix and blood transfusions as needed.  Appreciate GI assistance with pursuing definitive treatment via EGD.  If patient were to become hemodynamically unstable with life-threatening bleed then we could give platelets for reversal and use cangrelor as a bridging option for more invasive surgery or procedures.  -I discussed her current situation extensively with her regarding competing risks of stopping anti-platelet therapy and GI bleeding. Patient voiced understanding.  Nobie Putnam, MD 12/07/2023 4:53 PM

## 2023-12-07 NOTE — Consult Note (Signed)
 Reason for Consult: Melena, epigastric abdominal pain, and anemia Referring Physician: [facility]  Maryan Rued HPI: This is a 58 year old female s/p STEMI with 3 stents placed on 11/22/2023 (Mid LAD 100% occlusion, Ost LAD 70% occlusion), s/p Roux-en-Y gastric bypass in 2003 at [facility], DM, and hyperlipidemia admitted for progressive, fatigue, and near syncope.  The patient reported that she complained about abdominal pain 2 days prior to her admission for the MI at [facility].  At that time she thought that she was constipated and she was treated in the ER for that issue, however, two days later she stated that her pain did not improve and she started to experience a tightness in her chest.  She was much more fatigued and she called her family for assistance.  At that time she was able to take an ASA on her own while waiting for EMS.  On 11/22/2023 she was identified to have an STEMI and an emergent catherization was performed and three stents were performed, which achieved patency of the LAD.  She reported a resolution of her chest tightness in the ICU, but she continued to complain about abdominal pain.  A few enemas and suppositories were provided for her and she was able to pass some harder bowel movements.  Blood was noted in the bowel movements, but she was told that it was secondary to straining and being on Brilinta and ASA.  However, she denied any straining with her bowel movements.  Her HGB on admission at [facility] was at 12 g/dL and her discharge HGB was 9.6 g/dL.  During that whole time she was in the hospital she complained about melena, which was in the form of "black oil".  Her abdominal pain, which was in the epigastric region never resolved.  The patient was discharged home, but she continued to feel progressively worse with fatigue and DOE.  As a results she presented to the ER and her HGB was noted to be at 7.0 g/dL.  After her gastric bypass she lost  approximately 50% of her weight, but she lost an additional 10 lbs with all of her current medical issues.  The patient currently denies any pain.  A colonoscopy was performed at [facility] in 02/2022 with a 6 minute withdrawal time and it was reported to be a normal examination.  A 10 year follow up was recommended.  Before her weight loss she did report some issues with GERD, but subsequently GERD was not a problem.  It was not a problem preceding her MI.  Past Medical History:  Diagnosis Date   Anxiety    Diabetes mellitus    borderline   Hypercholesterolemia    Hypertension    Obesity     Past Surgical History:  Procedure Laterality Date   ABDOMINAL HYSTERECTOMY     CHOLECYSTECTOMY     GASTRIC BYPASS      Family History  Problem Relation Age of Onset   Cancer Other    Hypertension Other    Stroke Other    Diabetes Other     Social History:  reports that she has never smoked. She has never used smokeless tobacco. She reports that she does not drink alcohol and does not use drugs.  Allergies:  Allergies  Allergen Reactions   Niaspan [Niacin] Hives and Swelling    Burning sensation, passed out    Medications: Scheduled:  sodium chloride   Intravenous Once   pantoprazole (PROTONIX)  IV  40 mg Intravenous Q12H   Continuous:  Results for orders placed or performed during the hospital encounter of 12/06/23 (from the past 24 hours)  CBG monitoring, ED     Status: None   Collection Time: 12/06/23  5:56 PM  Result Value Ref Range   Glucose-Capillary 92 70 - 99 mg/dL  Basic metabolic panel     Status: Abnormal   Collection Time: 12/06/23  5:59 PM  Result Value Ref Range   Sodium 140 135 - 145 mmol/L   Potassium 3.6 3.5 - 5.1 mmol/L   Chloride 106 98 - 111 mmol/L   CO2 25 22 - 32 mmol/L   Glucose, Bld 111 (H) 70 - 99 mg/dL   BUN 11 6 - 20 mg/dL   Creatinine, Ser 1.61 0.44 - 1.00 mg/dL   Calcium 8.8 (L) 8.9 - 10.3 mg/dL   GFR, Estimated >09 >60 mL/min   Anion  gap 9 5 - 15  CBC     Status: Abnormal   Collection Time: 12/06/23  5:59 PM  Result Value Ref Range   WBC 9.8 4.0 - 10.5 K/uL   RBC 2.30 (L) 3.87 - 5.11 MIL/uL   Hemoglobin 7.0 (L) 12.0 - 15.0 g/dL   HCT 45.4 (L) 09.8 - 11.9 %   MCV 93.9 80.0 - 100.0 fL   MCH 30.4 26.0 - 34.0 pg   MCHC 32.4 30.0 - 36.0 g/dL   RDW 14.7 82.9 - 56.2 %   Platelets 608 (H) 150 - 400 K/uL   nRBC 0.2 0.0 - 0.2 %  Protime-INR     Status: None   Collection Time: 12/06/23  6:37 PM  Result Value Ref Range   Prothrombin Time 14.7 11.4 - 15.2 seconds   INR 1.1 0.8 - 1.2  Lipase, blood     Status: None   Collection Time: 12/06/23  6:37 PM  Result Value Ref Range   Lipase 19 11 - 51 U/L  I-Stat Lactic Acid, ED     Status: None   Collection Time: 12/06/23  6:46 PM  Result Value Ref Range   Lactic Acid, Venous 1.1 0.5 - 1.9 mmol/L  I-Stat Chem 8, ED     Status: Abnormal   Collection Time: 12/06/23  6:51 PM  Result Value Ref Range   Sodium 143 135 - 145 mmol/L   Potassium 3.7 3.5 - 5.1 mmol/L   Chloride 106 98 - 111 mmol/L   BUN 12 6 - 20 mg/dL   Creatinine, Ser 1.30 0.44 - 1.00 mg/dL   Glucose, Bld 865 (H) 70 - 99 mg/dL   Calcium, Ion 7.84 6.96 - 1.40 mmol/L   TCO2 24 22 - 32 mmol/L   Hemoglobin 7.5 (L) 12.0 - 15.0 g/dL   HCT 29.5 (L) 28.4 - 13.2 %  Type and screen [facility]     Status: None (Preliminary result)   Collection Time: 12/06/23  9:20 PM  Result Value Ref Range   ABO/RH(D) B POS    Antibody Screen POS    Sample Expiration 12/09/2023,2359    Antibody Identification ANTI e    PT AG Type POSITIVE FOR e ANTIGEN NEGATIVE FOR C ANTIGEN    DAT, IgG NEG    Unit Number [id number]    Blood Component Type RED CELLS,LR    Unit division 00    Status of Unit ISSUED    Donor AG Type NEGATIVE FOR e ANTIGEN NEGATIVE FOR C ANTIGEN    Transfusion Status  OK TO TRANSFUSE    Crossmatch Result COMPATIBLE    Unit Number [id number]    Blood Component Type RED CELLS,LR    Unit  division 00    Status of Unit ALLOCATED    Donor AG Type NEGATIVE FOR e ANTIGEN NEGATIVE FOR C ANTIGEN    Transfusion Status OK TO TRANSFUSE    Crossmatch Result COMPATIBLE    Unit Number [id number]    Blood Component Type RED CELLS,LR    Unit division 00    Status of Unit REL FROM [facility]    Transfusion Status DO NOT ISSUE FOR TRANSFUSION    Crossmatch Result INCOMPATIBLE    Unit Number [id number]    Blood Component Type RED CELLS,LR    Unit division 00    Status of Unit REL FROM [facility]    Transfusion Status DO NOT ISSUE FOR TRANSFUSION    Crossmatch Result INCOMPATIBLE   ABO/Rh     Status: None   Collection Time: 12/06/23  9:28 PM  Result Value Ref Range   ABO/RH(D)      B POS Performed at [facility], [address], [place], [place] [address]   Prepare RBC (crossmatch)     Status: None   Collection Time: 12/06/23 10:30 PM  Result Value Ref Range   Order Confirmation      ORDER PROCESSED BY BLOOD BANK Performed at [facility], [address] [place], [place] [address]   Urinalysis, Routine w reflex microscopic -Urine, Clean Catch     Status: Abnormal   Collection Time: 12/07/23  6:39 AM  Result Value Ref Range   Color, Urine YELLOW YELLOW   APPearance HAZY (A) CLEAR   Specific Gravity, Urine 1.029 1.005 - 1.030   pH 5.0 5.0 - 8.0   Glucose, UA NEGATIVE NEGATIVE mg/dL   Hgb urine dipstick NEGATIVE NEGATIVE   Bilirubin Urine NEGATIVE NEGATIVE   Ketones, ur 20 (A) NEGATIVE mg/dL   Protein, ur 30 (A) NEGATIVE mg/dL   Nitrite NEGATIVE NEGATIVE   Leukocytes,Ua NEGATIVE NEGATIVE   RBC / HPF 0-5 0 - 5 RBC/hpf   WBC, UA 0-5 0 - 5 WBC/hpf   Bacteria, UA NONE SEEN NONE SEEN   Squamous Epithelial / HPF 0-5 0 - 5 /HPF   Mucus PRESENT   Hepatic function panel     Status: Abnormal   Collection Time: 12/07/23  6:39 AM  Result Value Ref Range   Total Protein 6.1 (L) 6.5 - 8.1 g/dL   Albumin 3.4 (L) 3.5 - 5.0 g/dL   AST 17 15 - 41 U/L   ALT 12 0 - 44 U/L    Alkaline Phosphatase 46 38 - 126 U/L   Total Bilirubin 0.6 0.0 - 1.2 mg/dL   Bilirubin, Direct 0.1 0.0 - 0.2 mg/dL   Indirect Bilirubin 0.5 0.3 - 0.9 mg/dL  Troponin I (High Sensitivity)     Status: None   Collection Time: 12/07/23  6:39 AM  Result Value Ref Range   Troponin I (High Sensitivity) 16 <18 ng/L  CBC     Status: Abnormal   Collection Time: 12/07/23  6:39 AM  Result Value Ref Range   WBC 10.7 (H) 4.0 - 10.5 K/uL   RBC 2.58 (L) 3.87 - 5.11 MIL/uL   Hemoglobin 7.9 (L) 12.0 - 15.0 g/dL   HCT 10.2 (L) 72.5 - 36.6 %   MCV 90.7 80.0 - 100.0 fL   MCH 30.6 26.0 - 34.0 pg   MCHC  33.8 30.0 - 36.0 g/dL   RDW 29.5 28.4 - 13.2 %   Platelets 529 (H) 150 - 400 K/uL   nRBC 0.0 0.0 - 0.2 %     No results found.  ROS:  As stated above in the HPI otherwise negative.  Blood pressure 101/69, pulse 85, temperature 98.5 F (36.9 C), temperature source Oral, resp. rate (!) 21, height 5\' 4"  (1.626 m), weight 63.5 kg, SpO2 100%.    PE: Gen: NAD, Alert and Oriented HEENT:  [place]/AT, EOMI Neck: Supple, no LAD Lungs: CTA Bilaterally CV: RRR without M/G/R ABD: Soft, epigastric, +BS Ext: No C/C/E  Assessment/Plan: 1) Melena. 2) Symptomatic anemia. 3) Epigastric abdominal pain. 4) MI.   The pretest probability for a marginal/anastomotic ulcer is high.  Currently she is stable and her last use of Brilinta was yesterday AM.  She was initiated on pantoprazole last evening.  It is unclear why a GI evaluation was not pursued when she was at [facility], even though there was a clear drop in her HGB and gross evidence of bleeding.  Plan: 1) EGD - ? Timing. 2) Maintain pantoprazole. 3) A cardiology consultation is required to determine if she can be off of Brilinta for this hospitalization versus staying on some form of anticoagulation with fresh stents. 4) Monitor HGB and transfuse as necessary.  Vanessa Wells D 12/07/2023, 7:52 AM

## 2023-12-07 NOTE — Discharge Instructions (Addendum)
 Follow with Primary MD Renaye Rakers, MD in 2-3 days   Get CBC, CMP, 2 view Chest X ray -  checked next visit with your primary MD   Activity: As tolerated with Full fall precautions use walker/cane & assistance as needed  Disposition Home    Diet: Heart Healthy    Special Instructions: If you have smoked or chewed Tobacco  in the last 2 yrs please stop smoking, stop any regular Alcohol  and or any Recreational drug use.  On your next visit with your primary care physician please Get Medicines reviewed and adjusted.  Please request your Prim.MD to go over all Hospital Tests and Procedure/Radiological results at the follow up, please get all Hospital records sent to your Prim MD by signing hospital release before you go home.  If you experience worsening of your admission symptoms, develop shortness of breath, life threatening emergency, suicidal or homicidal thoughts you must seek medical attention immediately by calling 911 or calling your MD immediately  if symptoms less severe.  You Must read complete instructions/literature along with all the possible adverse reactions/side effects for all the Medicines you take and that have been prescribed to you. Take any new Medicines after you have completely understood and accpet all the possible adverse reactions/side effects.   Do not drive when taking Pain medications.  Do not take more than prescribed Pain, Sleep and Anxiety Medications  Wear Seat belts while driving.

## 2023-12-07 NOTE — H&P (View-Only) (Signed)
 Reason for Consult: Melena, epigastric abdominal pain, and anemia Referring Physician: [facility]  Vanessa Wells HPI: This is a 58 year old female s/p STEMI with 3 stents placed on 11/22/2023 (Mid LAD 100% occlusion, Ost LAD 70% occlusion), s/p Roux-en-Y gastric bypass in 2003 at [facility], DM, and hyperlipidemia admitted for progressive, fatigue, and near syncope.  The patient reported that she complained about abdominal pain 2 days prior to her admission for the MI at [facility].  At that time she thought that she was constipated and she was treated in the ER for that issue, however, two days later she stated that her pain did not improve and she started to experience a tightness in her chest.  She was much more fatigued and she called her family for assistance.  At that time she was able to take an ASA on her own while waiting for EMS.  On 11/22/2023 she was identified to have an STEMI and an emergent catherization was performed and three stents were performed, which achieved patency of the LAD.  She reported a resolution of her chest tightness in the ICU, but she continued to complain about abdominal pain.  A few enemas and suppositories were provided for her and she was able to pass some harder bowel movements.  Blood was noted in the bowel movements, but she was told that it was secondary to straining and being on Brilinta and ASA.  However, she denied any straining with her bowel movements.  Her HGB on admission at [facility] was at 12 g/dL and her discharge HGB was 9.6 g/dL.  During that whole time she was in the hospital she complained about melena, which was in the form of "black oil".  Her abdominal pain, which was in the epigastric region never resolved.  The patient was discharged home, but she continued to feel progressively worse with fatigue and DOE.  As a results she presented to the ER and her HGB was noted to be at 7.0 g/dL.  After her gastric bypass she lost  approximately 50% of her weight, but she lost an additional 10 lbs with all of her current medical issues.  The patient currently denies any pain.  A colonoscopy was performed at [facility] in 02/2022 with a 6 minute withdrawal time and it was reported to be a normal examination.  A 10 year follow up was recommended.  Before her weight loss she did report some issues with GERD, but subsequently GERD was not a problem.  It was not a problem preceding her MI.  Past Medical History:  Diagnosis Date   Anxiety    Diabetes mellitus    borderline   Hypercholesterolemia    Hypertension    Obesity     Past Surgical History:  Procedure Laterality Date   ABDOMINAL HYSTERECTOMY     CHOLECYSTECTOMY     GASTRIC BYPASS      Family History  Problem Relation Age of Onset   Cancer Other    Hypertension Other    Stroke Other    Diabetes Other     Social History:  reports that she has never smoked. She has never used smokeless tobacco. She reports that she does not drink alcohol and does not use drugs.  Allergies:  Allergies  Allergen Reactions   Niaspan [Niacin] Hives and Swelling    Burning sensation, passed out    Medications: Scheduled:  sodium chloride   Intravenous Once   pantoprazole (PROTONIX)  IV  40 mg Intravenous Q12H   Continuous:  Results for orders placed or performed during the hospital encounter of 12/06/23 (from the past 24 hours)  CBG monitoring, ED     Status: None   Collection Time: 12/06/23  5:56 PM  Result Value Ref Range   Glucose-Capillary 92 70 - 99 mg/dL  Basic metabolic panel     Status: Abnormal   Collection Time: 12/06/23  5:59 PM  Result Value Ref Range   Sodium 140 135 - 145 mmol/L   Potassium 3.6 3.5 - 5.1 mmol/L   Chloride 106 98 - 111 mmol/L   CO2 25 22 - 32 mmol/L   Glucose, Bld 111 (H) 70 - 99 mg/dL   BUN 11 6 - 20 mg/dL   Creatinine, Ser 1.61 0.44 - 1.00 mg/dL   Calcium 8.8 (L) 8.9 - 10.3 mg/dL   GFR, Estimated >09 >60 mL/min   Anion  gap 9 5 - 15  CBC     Status: Abnormal   Collection Time: 12/06/23  5:59 PM  Result Value Ref Range   WBC 9.8 4.0 - 10.5 K/uL   RBC 2.30 (L) 3.87 - 5.11 MIL/uL   Hemoglobin 7.0 (L) 12.0 - 15.0 g/dL   HCT 45.4 (L) 09.8 - 11.9 %   MCV 93.9 80.0 - 100.0 fL   MCH 30.4 26.0 - 34.0 pg   MCHC 32.4 30.0 - 36.0 g/dL   RDW 14.7 82.9 - 56.2 %   Platelets 608 (H) 150 - 400 K/uL   nRBC 0.2 0.0 - 0.2 %  Protime-INR     Status: None   Collection Time: 12/06/23  6:37 PM  Result Value Ref Range   Prothrombin Time 14.7 11.4 - 15.2 seconds   INR 1.1 0.8 - 1.2  Lipase, blood     Status: None   Collection Time: 12/06/23  6:37 PM  Result Value Ref Range   Lipase 19 11 - 51 U/L  I-Stat Lactic Acid, ED     Status: None   Collection Time: 12/06/23  6:46 PM  Result Value Ref Range   Lactic Acid, Venous 1.1 0.5 - 1.9 mmol/L  I-Stat Chem 8, ED     Status: Abnormal   Collection Time: 12/06/23  6:51 PM  Result Value Ref Range   Sodium 143 135 - 145 mmol/L   Potassium 3.7 3.5 - 5.1 mmol/L   Chloride 106 98 - 111 mmol/L   BUN 12 6 - 20 mg/dL   Creatinine, Ser 1.30 0.44 - 1.00 mg/dL   Glucose, Bld 865 (H) 70 - 99 mg/dL   Calcium, Ion 7.84 6.96 - 1.40 mmol/L   TCO2 24 22 - 32 mmol/L   Hemoglobin 7.5 (L) 12.0 - 15.0 g/dL   HCT 29.5 (L) 28.4 - 13.2 %  Type and screen [facility]     Status: None (Preliminary result)   Collection Time: 12/06/23  9:20 PM  Result Value Ref Range   ABO/RH(D) B POS    Antibody Screen POS    Sample Expiration 12/09/2023,2359    Antibody Identification ANTI e    PT AG Type POSITIVE FOR e ANTIGEN NEGATIVE FOR C ANTIGEN    DAT, IgG NEG    Unit Number [id number]    Blood Component Type RED CELLS,LR    Unit division 00    Status of Unit ISSUED    Donor AG Type NEGATIVE FOR e ANTIGEN NEGATIVE FOR C ANTIGEN    Transfusion Status  OK TO TRANSFUSE    Crossmatch Result COMPATIBLE    Unit Number [id number]    Blood Component Type RED CELLS,LR    Unit  division 00    Status of Unit ALLOCATED    Donor AG Type NEGATIVE FOR e ANTIGEN NEGATIVE FOR C ANTIGEN    Transfusion Status OK TO TRANSFUSE    Crossmatch Result COMPATIBLE    Unit Number [id number]    Blood Component Type RED CELLS,LR    Unit division 00    Status of Unit REL FROM [facility]    Transfusion Status DO NOT ISSUE FOR TRANSFUSION    Crossmatch Result INCOMPATIBLE    Unit Number [id number]    Blood Component Type RED CELLS,LR    Unit division 00    Status of Unit REL FROM [facility]    Transfusion Status DO NOT ISSUE FOR TRANSFUSION    Crossmatch Result INCOMPATIBLE   ABO/Rh     Status: None   Collection Time: 12/06/23  9:28 PM  Result Value Ref Range   ABO/RH(D)      B POS Performed at [facility], [address], [place], [place] [address]   Prepare RBC (crossmatch)     Status: None   Collection Time: 12/06/23 10:30 PM  Result Value Ref Range   Order Confirmation      ORDER PROCESSED BY BLOOD BANK Performed at [facility], [address] [place], [place] [address]   Urinalysis, Routine w reflex microscopic -Urine, Clean Catch     Status: Abnormal   Collection Time: 12/07/23  6:39 AM  Result Value Ref Range   Color, Urine YELLOW YELLOW   APPearance HAZY (A) CLEAR   Specific Gravity, Urine 1.029 1.005 - 1.030   pH 5.0 5.0 - 8.0   Glucose, UA NEGATIVE NEGATIVE mg/dL   Hgb urine dipstick NEGATIVE NEGATIVE   Bilirubin Urine NEGATIVE NEGATIVE   Ketones, ur 20 (A) NEGATIVE mg/dL   Protein, ur 30 (A) NEGATIVE mg/dL   Nitrite NEGATIVE NEGATIVE   Leukocytes,Ua NEGATIVE NEGATIVE   RBC / HPF 0-5 0 - 5 RBC/hpf   WBC, UA 0-5 0 - 5 WBC/hpf   Bacteria, UA NONE SEEN NONE SEEN   Squamous Epithelial / HPF 0-5 0 - 5 /HPF   Mucus PRESENT   Hepatic function panel     Status: Abnormal   Collection Time: 12/07/23  6:39 AM  Result Value Ref Range   Total Protein 6.1 (L) 6.5 - 8.1 g/dL   Albumin 3.4 (L) 3.5 - 5.0 g/dL   AST 17 15 - 41 U/L   ALT 12 0 - 44 U/L    Alkaline Phosphatase 46 38 - 126 U/L   Total Bilirubin 0.6 0.0 - 1.2 mg/dL   Bilirubin, Direct 0.1 0.0 - 0.2 mg/dL   Indirect Bilirubin 0.5 0.3 - 0.9 mg/dL  Troponin I (High Sensitivity)     Status: None   Collection Time: 12/07/23  6:39 AM  Result Value Ref Range   Troponin I (High Sensitivity) 16 <18 ng/L  CBC     Status: Abnormal   Collection Time: 12/07/23  6:39 AM  Result Value Ref Range   WBC 10.7 (H) 4.0 - 10.5 K/uL   RBC 2.58 (L) 3.87 - 5.11 MIL/uL   Hemoglobin 7.9 (L) 12.0 - 15.0 g/dL   HCT 10.2 (L) 72.5 - 36.6 %   MCV 90.7 80.0 - 100.0 fL   MCH 30.6 26.0 - 34.0 pg   MCHC  33.8 30.0 - 36.0 g/dL   RDW 29.5 28.4 - 13.2 %   Platelets 529 (H) 150 - 400 K/uL   nRBC 0.0 0.0 - 0.2 %     No results found.  ROS:  As stated above in the HPI otherwise negative.  Blood pressure 101/69, pulse 85, temperature 98.5 F (36.9 C), temperature source Oral, resp. rate (!) 21, height 5\' 4"  (1.626 m), weight 63.5 kg, SpO2 100%.    PE: Gen: NAD, Alert and Oriented HEENT:  [place]/AT, EOMI Neck: Supple, no LAD Lungs: CTA Bilaterally CV: RRR without M/G/R ABD: Soft, epigastric, +BS Ext: No C/C/E  Assessment/Plan: 1) Melena. 2) Symptomatic anemia. 3) Epigastric abdominal pain. 4) MI.   The pretest probability for a marginal/anastomotic ulcer is high.  Currently she is stable and her last use of Brilinta was yesterday AM.  She was initiated on pantoprazole last evening.  It is unclear why a GI evaluation was not pursued when she was at [facility], even though there was a clear drop in her HGB and gross evidence of bleeding.  Plan: 1) EGD - ? Timing. 2) Maintain pantoprazole. 3) A cardiology consultation is required to determine if she can be off of Brilinta for this hospitalization versus staying on some form of anticoagulation with fresh stents. 4) Monitor HGB and transfuse as necessary.  Yahaira Bruski D 12/07/2023, 7:52 AM

## 2023-12-07 NOTE — Progress Notes (Signed)
 [facility] PROGRESS NOTE    Progress Note  Vanessa Wells  HQI:[id number] DOB: 10-30-1965 DOA: 12/06/2023 PCP: Renaye Rakers, MD     Brief Narrative:   Vanessa Wells is an 58 y.o. female past medical history significant for CAD with a recent STEMI 2 weeks ago underwent PC (at [facility] on 11/22/2023) I, 3 stents deployed on DAPT (aspirin and Brilinta) therapy with an MI about 2 weeks ago, also treated for pericarditis on colchicine, history of bariatric surgery in the fall 2023,essential hypertension prediabetes comes in for generalized weakness/fatigue, melena and near syncope, hemoglobin on admission 7.0 (14 2 weeks ago) platelet count of 608 twelve-lead EKG shows first-degree block diffuse ST segment abnormalities started on IV Protonix transfuse 1 unit of packed red blood cells in the ED GI was consulted recommended try to admit.  Assessment/Plan:   Acute GI bleeding/acute blood loss anemia: With history of NSTEMI 2 weeks ago on DAPT therapy. Hemoglobin 2 weeks ago was 14, on admission it was 7.0. She is hemodynamically stable. Try to keep hemoglobin greater than 8.  She has received 1 unit of packed red blood cells repeat a CBC post transfusional. GI has been consulted  History of her recent STEMI: She is on aspirin and Brilinta, will hold due to a significant drop in her hemoglobin and melanotic stools. She denies any chest pain. Twelve-lead EKG showed diffuse mild T wave inversions. I have consulted cardiology they will see this morning.  To advise on aspirin and Brilinta.  Thrombocytosis: Likely reactive due to GI bleed.  Essential hypertension: Will hold antihypertensive medications this morning.  Prediabetes mellitus: With a recent A1c of 5.0, currently NPO. Monitor with sliding scale insulin CBGs every 4.  Hyperlipidemia/anxiety: Will resume home meds when she is able to take orals.   DVT prophylaxis: SCD's Family  Communication:none Status is: Observation The patient will require care spanning > 2 midnights and should be moved to inpatient because: Acute GI bleed   Code Status:     Code Status Orders  (From admission, onward)           Start     Ordered   12/07/23 0625  Full code  Continuous       Question:  By:  Answer:  Consent: discussion documented in EHR   12/07/23 0625           Code Status History     This patient has a current code status but no historical code status.         IV Access:   Peripheral IV   Procedures and diagnostic studies:   No results found.   Medical Consultants:   None.   Subjective:    Vanessa Wells she denies any chest pain.  Objective:    Vitals:   12/07/23 0530 12/07/23 0545 12/07/23 0600 12/07/23 0615  BP: 100/69 103/64 101/89 101/69  Pulse: 87 84 86 85  Resp: 14 18 20  (!) 21  Temp:      TempSrc:      SpO2: 100% 100% 100% 100%  Weight:      Height:       SpO2: 100 %   Intake/Output Summary (Last 24 hours) at 12/07/2023 0628 Last data filed at 12/07/2023 0443 Gross per 24 hour  Intake 315 ml  Output --  Net 315 ml   Filed Weights   12/06/23 1748  Weight: 63.5 kg    Exam: General exam: In no acute distress.  Respiratory system: Good air movement and clear to auscultation. Cardiovascular system: S1 & S2 heard, RRR. No JVD. Gastrointestinal system: Abdomen is nondistended, soft and nontender.  Extremities: No pedal edema. Skin: No rashes, lesions or ulcers Psychiatry: Judgement and insight appear normal. Mood & affect appropriate.    Data Reviewed:    Labs: Basic Metabolic Panel: Recent Labs  Lab 12/06/23 1759 12/06/23 1851  NA 140 143  K 3.6 3.7  CL 106 106  CO2 25  --   GLUCOSE 111* 107*  BUN 11 12  CREATININE 0.55 0.50  CALCIUM 8.8*  --    GFR Estimated Creatinine Clearance: 68.6 mL/min (by C-G formula based on SCr of 0.5 mg/dL). Liver Function Tests: No results for input(s):  "AST", "ALT", "ALKPHOS", "BILITOT", "PROT", "ALBUMIN" in the last 168 hours. Recent Labs  Lab 12/06/23 1837  LIPASE 19   No results for input(s): "AMMONIA" in the last 168 hours. Coagulation profile Recent Labs  Lab 12/06/23 1837  INR 1.1   COVID-19 Labs  No results for input(s): "DDIMER", "FERRITIN", "LDH", "CRP" in the last 72 hours.  Lab Results  Component Value Date   SARSCOV2NAA Detected (A) 07/30/2019    CBC: Recent Labs  Lab 12/06/23 1759 12/06/23 1851  WBC 9.8  --   HGB 7.0* 7.5*  HCT 21.6* 22.0*  MCV 93.9  --   PLT 608*  --    Cardiac Enzymes: No results for input(s): "CKTOTAL", "CKMB", "CKMBINDEX", "TROPONINI" in the last 168 hours. BNP (last 3 results) No results for input(s): "PROBNP" in the last 8760 hours. CBG: Recent Labs  Lab 12/06/23 1756  GLUCAP 92   D-Dimer: No results for input(s): "DDIMER" in the last 72 hours. Hgb A1c: No results for input(s): "HGBA1C" in the last 72 hours. Lipid Profile: No results for input(s): "CHOL", "HDL", "LDLCALC", "TRIG", "CHOLHDL", "LDLDIRECT" in the last 72 hours. Thyroid function studies: No results for input(s): "TSH", "T4TOTAL", "T3FREE", "THYROIDAB" in the last 72 hours.  Invalid input(s): "FREET3" Anemia work up: No results for input(s): "VITAMINB12", "FOLATE", "FERRITIN", "TIBC", "IRON", "RETICCTPCT" in the last 72 hours. Sepsis Labs: Recent Labs  Lab 12/06/23 1759 12/06/23 1846  WBC 9.8  --   LATICACIDVEN  --  1.1   Microbiology No results found for this or any previous visit (from the past 240 hours).   Medications:    sodium chloride   Intravenous Once   pantoprazole (PROTONIX) IV  40 mg Intravenous Q12H   Continuous Infusions:    LOS: 0 days   Marinda Elk  [facility]  12/07/2023, 6:28 AM

## 2023-12-07 NOTE — ED Notes (Addendum)
 Blood bank contacte this nurse ot get more information on pts Hx of blood transfusion. Blood bank continuing to prepare blood.

## 2023-12-07 NOTE — Anesthesia Preprocedure Evaluation (Signed)
 Anesthesia Evaluation  Patient identified by MRN, date of birth, ID band Patient awake    Reviewed: Allergy & Precautions, NPO status , Patient's Chart, lab work & pertinent test results, reviewed documented beta blocker date and time   History of Anesthesia Complications Negative for: history of anesthetic complications  Airway Mallampati: II  TM Distance: >3 FB Neck ROM: Full    Dental  (+) Dental Advisory Given, Missing   Pulmonary sleep apnea    Pulmonary exam normal        Cardiovascular hypertension, Pt. on medications and Pt. on home beta blockers pulmonary hypertension+ CAD, + Past MI and + Cardiac Stents  Normal cardiovascular exam   STEMI 2 wks ago with new stents placed  '25 TTE - There is mild concentric left ventricular hypertrophy. Severe apical and mild mid anterior and anteroseptal hypokinesis. LV ejection fraction = 55-60%. There is mild to moderate tricuspid regurgitation. Mild to moderate pulmonary hypertension. There is small to moderate size pericardial effusion without clear echocardiographic evidence of  tamponade.     Neuro/Psych  PSYCHIATRIC DISORDERS Anxiety      Social anxiety Schizotypal personality d/o negative neurological ROS     GI/Hepatic Neg liver ROS,,, S/p gastric bypass    Endo/Other  diabetes    Renal/GU negative Renal ROS     Musculoskeletal negative musculoskeletal ROS (+)    Abdominal   Peds  Hematology  (+) Blood dyscrasia, anemia  On brilinta    Anesthesia Other Findings   Reproductive/Obstetrics                             Anesthesia Physical Anesthesia Plan  ASA: 4  Anesthesia Plan: MAC   Post-op Pain Management: Minimal or no pain anticipated   Induction:   PONV Risk Score and Plan: 2 and Propofol infusion and Treatment may vary due to age or medical condition  Airway Management Planned: Nasal Cannula and Natural  Airway  Additional Equipment: None  Intra-op Plan:   Post-operative Plan:   Informed Consent: I have reviewed the patients History and Physical, chart, labs and discussed the procedure including the risks, benefits and alternatives for the proposed anesthesia with the patient or authorized representative who has indicated his/her understanding and acceptance.       Plan Discussed with: CRNA and Anesthesiologist  Anesthesia Plan Comments:        Anesthesia Quick Evaluation

## 2023-12-07 NOTE — Plan of Care (Signed)
 Admitted from ED to 5W, ambulatory in room with minimal assistance.  Plan for EGD tomorrow. Consent transcribed and placed in the chart.  Family visiting at bedside.   Problem: Education: Goal: Knowledge of General Education information will improve Description: Including pain rating scale, medication(s)/side effects and non-pharmacologic comfort measures Outcome: Progressing   Problem: Health Behavior/Discharge Planning: Goal: Ability to manage health-related needs will improve Outcome: Progressing   Problem: Clinical Measurements: Goal: Ability to maintain clinical measurements within normal limits will improve Outcome: Progressing Goal: Will remain free from infection Outcome: Progressing

## 2023-12-07 NOTE — H&P (Signed)
 History and Physical    Vanessa Wells:[id number] DOB: Jan 13, 1966 DOA: 12/06/2023  PCP: Renaye Rakers, MD  Patient coming from: Home  Chief Complaint: Generalized weakness  HPI: Vanessa Wells is a 58 y.o. female with medical history significant of CAD with recent STEMI 2 weeks ago underwent PCI on DAPT and was also treated for pericarditis with colchicine, hypertension, hyperlipidemia, prediabetes, anxiety presented to the ED for evaluation of generalized weakness/fatigue, melena, and near syncope.  Vital signs stable.  Labs showing hemoglobin 7.0 (previously 14.4 on labs 2 weeks ago), platelet count 608k, lactate normal.  EKG showing sinus rhythm with first-degree AV block and diffuse mild T wave abnormality. Patient was given IV Protonix 80 mg in the ED and 1 unit PRBCs ordered.  GI consulted (Dr. Elnoria Howard) and [facility] called to admit.  Patient states she had a heart attack 2 weeks ago and had 3 stents placed.  She was placed on aspirin and Brilinta and since then has continued to have black stool every day.  She is reporting generalized weakness and dyspnea on exertion.  She feels lightheaded every time she gets up and feels like she will pass out.  Denies any chest pain.  She has not vomited any blood.  She has had some mild epigastric pain/cramping.  Never had EGD done in the past.  Denies alcohol or NSAID use.  Review of Systems:  Review of Systems  All other systems reviewed and are negative.   Past Medical History:  Diagnosis Date   Anxiety    Diabetes mellitus    borderline   Hypercholesterolemia    Hypertension    Obesity     Past Surgical History:  Procedure Laterality Date   ABDOMINAL HYSTERECTOMY     CHOLECYSTECTOMY     GASTRIC BYPASS       reports that she has never smoked. She has never used smokeless tobacco. She reports that she does not drink alcohol and does not use drugs.  Allergies  Allergen Reactions   Niaspan [Niacin] Hives and Swelling    Burning  sensation, passed out    Family History  Problem Relation Age of Onset   Cancer Other    Hypertension Other    Stroke Other    Diabetes Other     Prior to Admission medications   Medication Sig Start Date End Date Taking? Authorizing Provider  aspirin 81 MG chewable tablet Chew 81 mg by mouth daily.   Yes [provider]  atorvastatin (LIPITOR) 80 MG tablet Take 80 mg by mouth daily.   Yes [provider]  Cholecalciferol (TRUE VITAMIN D3) 1.25 MG (50000 UT) TABS Take 50,000 Units by mouth every Sunday.   Yes [provider]  metoprolol succinate (TOPROL-XL) 50 MG 24 hr tablet Take 1 tablet by mouth daily. 12/21/16  Yes [provider]  sertraline (ZOLOFT) 100 MG tablet Take 100 mg by mouth daily. 05/20/19  Yes [provider]  ticagrelor (BRILINTA) 90 MG TABS tablet Take 90 mg by mouth 2 (two) times daily. 11/25/23  Yes [provider]  metoCLOPramide (REGLAN) 10 MG tablet Take 1 tablet (10 mg total) by mouth every 6 (six) hours. Patient not taking: Reported on 12/07/2023 11/20/23   Loetta Rough, MD    Physical Exam: Vitals:   12/07/23 0443 12/07/23 0445 12/07/23 0500 12/07/23 0515  BP:  111/79 113/89 104/66  Pulse:  88 83 88  Resp:  17 (!) 23 18  Temp: 98.5 F (36.9  C)     TempSrc: Oral     SpO2:  100% 100% 100%  Weight:      Height:        Physical Exam Vitals reviewed.  Constitutional:      General: She is not in acute distress. HENT:     Head: Normocephalic and atraumatic.  Eyes:     Extraocular Movements: Extraocular movements intact.  Cardiovascular:     Rate and Rhythm: Normal rate and regular rhythm.     Pulses: Normal pulses.  Pulmonary:     Effort: Pulmonary effort is normal. No respiratory distress.     Breath sounds: Normal breath sounds. No wheezing or rales.  Abdominal:     General: Bowel sounds are normal.     Palpations: Abdomen is soft.     Tenderness: There is no abdominal tenderness. There is  no guarding or rebound.     Comments: Mild epigastric tenderness.  No guarding, rebound, or rigidity.  Musculoskeletal:     Cervical back: Normal range of motion.     Right lower leg: No edema.     Left lower leg: No edema.  Skin:    General: Skin is warm and dry.  Neurological:     General: No focal deficit present.     Mental Status: She is alert and oriented to person, place, and time.     Labs on Admission: I have personally reviewed following labs and imaging studies  CBC: Recent Labs  Lab 12/06/23 1759 12/06/23 1851  WBC 9.8  --   HGB 7.0* 7.5*  HCT 21.6* 22.0*  MCV 93.9  --   PLT 608*  --    Basic Metabolic Panel: Recent Labs  Lab 12/06/23 1759 12/06/23 1851  NA 140 143  K 3.6 3.7  CL 106 106  CO2 25  --   GLUCOSE 111* 107*  BUN 11 12  CREATININE 0.55 0.50  CALCIUM 8.8*  --    GFR: Estimated Creatinine Clearance: 68.6 mL/min (by C-G formula based on SCr of 0.5 mg/dL). Liver Function Tests: No results for input(s): "AST", "ALT", "ALKPHOS", "BILITOT", "PROT", "ALBUMIN" in the last 168 hours. Recent Labs  Lab 12/06/23 1837  LIPASE 19   No results for input(s): "AMMONIA" in the last 168 hours. Coagulation Profile: Recent Labs  Lab 12/06/23 1837  INR 1.1   Cardiac Enzymes: No results for input(s): "CKTOTAL", "CKMB", "CKMBINDEX", "TROPONINI" in the last 168 hours. BNP (last 3 results) No results for input(s): "PROBNP" in the last 8760 hours. HbA1C: No results for input(s): "HGBA1C" in the last 72 hours. CBG: Recent Labs  Lab 12/06/23 1756  GLUCAP 92   Lipid Profile: No results for input(s): "CHOL", "HDL", "LDLCALC", "TRIG", "CHOLHDL", "LDLDIRECT" in the last 72 hours. Thyroid Function Tests: No results for input(s): "TSH", "T4TOTAL", "FREET4", "T3FREE", "THYROIDAB" in the last 72 hours. Anemia Panel: No results for input(s): "VITAMINB12", "FOLATE", "FERRITIN", "TIBC", "IRON", "RETICCTPCT" in the last 72 hours. Urine analysis:    Component  Value Date/Time   COLORURINE YELLOW 11/20/2023 1524   APPEARANCEUR CLEAR 11/20/2023 1524   LABSPEC >=1.030 11/20/2023 1524   PHURINE 6.0 11/20/2023 1524   GLUCOSEU NEGATIVE 11/20/2023 1524   HGBUR NEGATIVE 11/20/2023 1524   BILIRUBINUR SMALL (A) 11/20/2023 1524   KETONESUR 40 (A) 11/20/2023 1524   PROTEINUR 100 (A) 11/20/2023 1524   UROBILINOGEN 1.0 04/07/2009 2004   NITRITE NEGATIVE 11/20/2023 1524   LEUKOCYTESUR NEGATIVE 11/20/2023 1524    Radiological Exams on Admission: No  results found.  Assessment and Plan  Symptomatic acute blood loss anemia secondary to suspected upper GI bleed Patient with history of recent STEMI/PCI 2 weeks ago started on dual antiplatelet therapy presenting with ongoing melena since then and significant drop in hemoglobin.  Hemoglobin currently 7.0 and was previously 14.4 on labs 2 weeks ago.  She has some mild epigastric tenderness but no peritoneal signs on exam.  Hemodynamically stable.  No previous EGD done and GI has been consulted.  She received 1 unit PRBCs in the ED and repeat CBC has been ordered.  Continue to transfuse if hemoglobin less than 8.  Continue IV PPI and keep n.p.o.  CAD with history of recent STEMI 2 weeks ago status post PCI Hold aspirin and Plavix at this time due to acute GI bleed and significant drop in hemoglobin.  Patient is not endorsing chest pain at this time.  EKG showing diffuse mild T wave abnormalities.  Check stat troponin.  Cardiology needs to be consulted in the morning for recommendations regarding antiplatelet therapy.  Thrombocytosis Continue to monitor CBC.  Hypertension Avoid antihypertensives at this time in the setting of acute GI bleed.  Prediabetes Recent A1c 5.0 on labs 2 weeks ago.  Hyperlipidemia Anxiety Resume home meds when no longer NPO.  DVT prophylaxis: SCDs Code Status: Full Code (discussed with the patient) Family Communication: No family available at this time. Level of care: Progressive  Care Unit Admission status: It is my clinical opinion that referral for OBSERVATION is reasonable and necessary in this patient based on the above information provided. The aforementioned taken together are felt to place the patient at high risk for further clinical deterioration. However, it is anticipated that the patient may be medically stable for discharge from the hospital within 24 to 48 hours.  John Giovanni MD [facility]  If 7PM-7AM, please contact night-coverage [email or web address]  12/07/2023, 5:32 AM

## 2023-12-07 NOTE — ED Notes (Signed)
 Spoke with patient's daughter Toni Amend and gave an update. Toni Amend stated "she would come up here to be with her mom shortly, but to call with any updates." We will continue to monitor.

## 2023-12-07 NOTE — Progress Notes (Signed)
 Patient for Endoscopy procedure tomorrow.  Per Endo RN, Vanessa Wells patient will need a 20g or greater for procedure.  Went to assess bilateral arms for new IV access. Patient reports difficulty for IV access in the ED.  Will place IV Team consult.     12/07/23 1655  Unsuccessful Nursing Procedure/Treatment  Type of Nursing Procedure/Treatment Peripheral IV insertion  Number of attempts 0  Location of attempt assessed bilateral arms, no potential sites observed

## 2023-12-08 ENCOUNTER — Inpatient Hospital Stay (HOSPITAL_COMMUNITY): Payer: Self-pay | Admitting: Anesthesiology

## 2023-12-08 ENCOUNTER — Encounter (HOSPITAL_COMMUNITY): Payer: Self-pay | Admitting: Internal Medicine

## 2023-12-08 ENCOUNTER — Encounter (HOSPITAL_COMMUNITY)
Admission: EM | Disposition: A | Discharge status: Home / Self Care | Payer: Self-pay | Source: Home / Self Care | Attending: Internal Medicine

## 2023-12-08 DIAGNOSIS — D62 Acute posthemorrhagic anemia: Secondary | ICD-10-CM | POA: Diagnosis not present

## 2023-12-08 DIAGNOSIS — Z9861 Coronary angioplasty status: Secondary | ICD-10-CM | POA: Diagnosis not present

## 2023-12-08 DIAGNOSIS — K922 Gastrointestinal hemorrhage, unspecified: Secondary | ICD-10-CM | POA: Diagnosis not present

## 2023-12-08 DIAGNOSIS — G4733 Obstructive sleep apnea (adult) (pediatric): Secondary | ICD-10-CM

## 2023-12-08 DIAGNOSIS — I2510 Atherosclerotic heart disease of native coronary artery without angina pectoris: Secondary | ICD-10-CM | POA: Diagnosis not present

## 2023-12-08 DIAGNOSIS — I10 Essential (primary) hypertension: Secondary | ICD-10-CM | POA: Diagnosis not present

## 2023-12-08 DIAGNOSIS — K284 Chronic or unspecified gastrojejunal ulcer with hemorrhage: Secondary | ICD-10-CM | POA: Diagnosis not present

## 2023-12-08 HISTORY — PX: HEMOSTASIS CLIP PLACEMENT: SHX6857

## 2023-12-08 HISTORY — PX: ESOPHAGOGASTRODUODENOSCOPY: SHX5428

## 2023-12-08 LAB — TYPE AND SCREEN
ABO/RH(D): B POS
Antibody Screen: POSITIVE
DAT, IgG: NEGATIVE
Donor AG Type: NEGATIVE
Donor AG Type: NEGATIVE
Unit division: 0
Unit division: 0
Unit division: 0
Unit division: 0

## 2023-12-08 LAB — CBC
HCT: 25.8 % — ABNORMAL LOW (ref 36.0–46.0)
HCT: 26.5 % — ABNORMAL LOW (ref 36.0–46.0)
Hemoglobin: 8.7 g/dL — ABNORMAL LOW (ref 12.0–15.0)
Hemoglobin: 9 g/dL — ABNORMAL LOW (ref 12.0–15.0)
MCH: 30.4 pg (ref 26.0–34.0)
MCH: 30.6 pg (ref 26.0–34.0)
MCHC: 33.7 g/dL (ref 30.0–36.0)
MCHC: 34 g/dL (ref 30.0–36.0)
MCV: 90.2 fL (ref 80.0–100.0)
MCV: 90.1 fL (ref 80.0–100.0)
Platelets: 442 10*3/uL — ABNORMAL HIGH (ref 150–400)
Platelets: 453 10*3/uL — ABNORMAL HIGH (ref 150–400)
RBC: 2.86 MIL/uL — ABNORMAL LOW (ref 3.87–5.11)
RBC: 2.94 MIL/uL — ABNORMAL LOW (ref 3.87–5.11)
RDW: 14.2 % (ref 11.5–15.5)
RDW: 14.2 % (ref 11.5–15.5)
WBC: 8.1 10*3/uL (ref 4.0–10.5)
WBC: 6.4 10*3/uL (ref 4.0–10.5)
nRBC: 0 % (ref 0.0–0.2)
nRBC: 0 % (ref 0.0–0.2)

## 2023-12-08 LAB — BPAM RBC
Blood Product Expiration Date: 202504102359
Blood Product Expiration Date: 202504132359
Blood Product Expiration Date: 202504192359
Blood Product Expiration Date: 202504192359
ISSUE DATE / TIME: 202503290222
ISSUE DATE / TIME: 202503292349
Unit Type and Rh: 5100
Unit Type and Rh: 5100
Unit Type and Rh: 7300
Unit Type and Rh: 7300

## 2023-12-08 SURGERY — EGD (ESOPHAGOGASTRODUODENOSCOPY)
Anesthesia: Monitor Anesthesia Care

## 2023-12-08 MED ORDER — IPRATROPIUM-ALBUTEROL 0.5-2.5 (3) MG/3ML IN SOLN: 3.0000 mL | Freq: Four times a day (QID) | RESPIRATORY_TRACT | Status: DC | PRN

## 2023-12-08 MED ORDER — ONDANSETRON HCL 4 MG/2ML IJ SOLN
4.0000 mg | Freq: Four times a day (QID) | INTRAMUSCULAR | Status: DC | PRN
  Administered 2023-12-16: 4 mg via INTRAVENOUS
  Filled 2023-12-08: qty 2

## 2023-12-08 MED ORDER — PROPOFOL 10 MG/ML IV BOLUS
INTRAVENOUS | Status: DC | PRN
  Administered 2023-12-08: 100 mg via INTRAVENOUS
  Administered 2023-12-08 (×2): 50 mg via INTRAVENOUS

## 2023-12-08 MED ORDER — SERTRALINE HCL 100 MG PO TABS
100.0000 mg | ORAL_TABLET | Freq: Every day | ORAL | Status: DC
  Administered 2023-12-09 – 2023-12-16 (×8): 100 mg via ORAL
  Filled 2023-12-08 (×8): qty 1

## 2023-12-08 MED ORDER — SODIUM CHLORIDE 0.9 % IV SOLN: INTRAVENOUS | Status: DC | PRN

## 2023-12-08 MED ORDER — ACETAMINOPHEN 325 MG PO TABS
650.0000 mg | ORAL_TABLET | Freq: Four times a day (QID) | ORAL | Status: DC | PRN
  Administered 2023-12-11 – 2023-12-14 (×2): 650 mg via ORAL
  Filled 2023-12-08 (×2): qty 2

## 2023-12-08 MED ORDER — ATORVASTATIN CALCIUM 80 MG PO TABS
80.0000 mg | ORAL_TABLET | Freq: Every day | ORAL | Status: DC
  Administered 2023-12-09 – 2023-12-16 (×8): 80 mg via ORAL
  Filled 2023-12-08 (×8): qty 1

## 2023-12-08 MED ORDER — PHENYLEPHRINE 80 MCG/ML (10ML) SYRINGE FOR IV PUSH (FOR BLOOD PRESSURE SUPPORT)
PREFILLED_SYRINGE | INTRAVENOUS | Status: DC | PRN
  Administered 2023-12-08: 80 ug via INTRAVENOUS

## 2023-12-08 NOTE — Anesthesia Postprocedure Evaluation (Signed)
 Anesthesia Post Note  Patient: Vanessa Wells  Procedure(s) Performed: EGD (ESOPHAGOGASTRODUODENOSCOPY) CONTROL OF HEMORRHAGE, GI TRACT, ENDOSCOPIC, BY CLIPPING OR OVERSEWING     Patient location during evaluation: PACU Anesthesia Type: MAC Level of consciousness: awake and alert Pain management: pain level controlled Vital Signs Assessment: post-procedure vital signs reviewed and stable Respiratory status: spontaneous breathing, nonlabored ventilation and respiratory function stable Cardiovascular status: stable and blood pressure returned to baseline Anesthetic complications: no   No notable events documented.  Last Vitals:  Vitals:   12/08/23 1105 12/08/23 1116  BP: 101/65 109/67  Pulse: 69 69  Resp: 14 12  Temp:  36.7 C  SpO2: 100% 100%    Last Pain:  Vitals:   12/08/23 1116  TempSrc: Oral  PainSc:                  Beryle Lathe

## 2023-12-08 NOTE — Op Note (Signed)
 [facility] Patient Name: Vanessa Wells Procedure Date : 12/08/2023 MRN: [id number] Attending MD: Jeani Hawking , MD, [id number] Date of Birth: Oct 11, 1965 CSN: [id number] Age: 58 Admit Type: Inpatient Procedure:                Upper GI endoscopy Indications:              Iron deficiency anemia, Melena Providers:                Jeani Hawking, MD, Eliberto Ivory, RN, Rhodia Albright,                            Technician Referring MD:              Medicines:                Propofol per Anesthesia Complications:            No immediate complications. Estimated Blood Loss:     Estimated blood loss: none. Procedure:                Pre-Anesthesia Assessment:                           - Prior to the procedure, a History and Physical                            was performed, and patient medications and                            allergies were reviewed. The patient's tolerance of                            previous anesthesia was also reviewed. The risks                            and benefits of the procedure and the sedation                            options and risks were discussed with the patient.                            All questions were answered, and informed consent                            was obtained. Prior Anticoagulants: The patient has                            taken Brilinta (ticagrelor), last dose was day of                            procedure. ASA Grade Assessment: III - A patient                            with severe systemic disease. After reviewing the  risks and benefits, the patient was deemed in                            satisfactory condition to undergo the procedure.                           - Sedation was administered by an anesthesia                            professional. Deep sedation was attained.                           After obtaining informed consent, the endoscope was                            passed under  direct vision. Throughout the                            procedure, the patient's blood pressure, pulse, and                            oxygen saturations were monitored continuously. The                            [id number]-H190 ([id number] Olympus endoscope was introduced                            through the mouth, and advanced to the proximal                            jejunum. The upper GI endoscopy was accomplished                            without difficulty. The patient tolerated the                            procedure well. Scope In: Scope Out: Findings:      The esophagus was normal.      Evidence of a Roux-en-Y gastrojejunostomy was found. The gastrojejunal       anastomosis was characterized by friable mucosa and inflammation. This       was traversed. The pouch-to-jejunum limb was characterized by erythema.      Two oozing cratered ulcers with a nonbleeding visible vessel (Forrest       Class IIa) were found at the anastomosis. The largest lesion was 20 mm       in largest dimension. Area was successfully injected with 6 mL PuraStat       for hemostasis.      Two large anastomotic ulcerations were identified. The more proximal       ulcer was a Forrest class III and the larger distal ulcer was 2 cm with       a possible visible vessel. Contact friability was noted in the ulcer bed       and along its edges. Because of her need for Brilinta after a recent       stent  placement, PuraStat was used to cover both ulcer beds. A total of       6 ml of Purastat was used, which completely filled both ulcers. Impression:               - Normal esophagus.                           - Roux-en-Y gastrojejunostomy with gastrojejunal                            anastomosis characterized by friable mucosa and                            inflammation.                           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 Injected.                            - No specimens collected. Recommendation:           - Return patient to hospital ward for ongoing care.                           - NPO.                           - Continue present medications.                           - Start sucralfate tomorrow.                           - Follow HGB and transfuse as necessary.                           - PPI BID x 1 month and then QD indefinitely.                           -  GI will resume care in the AM. 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 All rights reserved. The codes documented in this report are preliminary and upon coder review may  be revised to meet current compliance requirements. Jeani Hawking, MD Jeani Hawking, MD 12/08/2023 10:56:39 AM This report has been signed electronically. Number of Addenda: 0

## 2023-12-08 NOTE — Progress Notes (Addendum)
  CBC at 6:15 PM showing hemoglobin dropped to 7.5.   Transfusing 1 unit of blood overnight.  Patient does not have any active GI bleed overnight. No evidence of recurrent melena overnight. Gastroenterology has been evaluated patient plan for EGD ? Patient has recent episodes of STMI and CAD currently on aspirin and Brilinta. Continue to monitor CBC and transfuse as needed goal to keep hemoglobin level above 8 in the setting of recent CAD. Continue Protonix 40 mg IV twice daily.  Tereasa Coop, MD [facility] 12/08/2023, 3:25 AM

## 2023-12-08 NOTE — Progress Notes (Signed)
   Patient Name: KAYTEE TALIERCIO Date of Encounter: 12/08/2023 [facility] Cardiologist: None   Interval Summary  .    Transfused 1 unit PRBC overnight. Otherwise no events. Patient reports feeling okay this morning. Abdominal discomfort minimal. No chest pain. No new complaints.  Vital Signs .    Vitals:   12/07/23 2356 12/08/23 0011 12/08/23 0305 12/08/23 0800  BP: 106/76 103/68 97/61 (!) 109/59  Pulse: 86 80 66 73  Resp: 16 18 18 20   Temp: 98.8 F (37.1 C) 99 F (37.2 C) 98.5 F (36.9 C) 98.6 F (37 C)  TempSrc: Oral Oral Oral Oral  SpO2: 100% 100% 100% 100%  Weight:      Height:        Intake/Output Summary (Last 24 hours) at 12/08/2023 0939 Last data filed at 12/08/2023 0835 Gross per 24 hour  Intake 933 ml  Output 350 ml  Net 583 ml      12/06/2023    5:48 PM 11/20/2023    3:20 PM 03/26/2018    9:00 AM  Last 3 Weights  Weight (lbs) 140 lb 159 lb 338 lb  Weight (kg) 63.504 kg 72.122 kg 153.316 kg      Telemetry/ECG    Sinus rhythm - Personally Reviewed  Physical Exam .   GEN: No acute distress.   Neck: No JVD Cardiac: Normal rate, regular rhythm Respiratory: Clear to auscultation bilaterally. MS: No edema  Assessment & Plan .     Unfortunately, patient has developed a GI bleed after starting DAPT. It sounds like she had symptoms prior to PCI and GI pathology was already present, now just made worse by DAPT. Given fresh nature of anterior STEMI and recent PCI with multiple overlapping stents, she would be high risk for instent thrombosis of anti-platelet therapy and with this being the LAD, there is large area of myocardium at risk.    #. CAD s/p anterior STEMI: Underwent extensive PCI on 3/14 with 3 overlapping Xience stents. #. Acute GI bleed w/ melena:    Plan:  -Recommend patient remaining on dual antiplatelet therapy with aspirin and ticagrelor.  Patient has been hemodynamically stable in setting of her bleed.  For now would continue with  supportive care with IV fluids, Protonix and blood transfusions as needed.  Appreciate GI assistance with pursuing definitive treatment via EGD, hopefully today.  If patient were to become hemodynamically unstable with life-threatening bleed then we could give platelets for reversal and use cangrelor as a bridging option for more invasive surgery or procedures.  -I discussed the complexity of her current situation extensively with her regarding competing risks of stopping anti-platelet therapy and GI bleeding. Patient voiced understanding and agrees with current plan.  For questions or updates, please contact [facility] Please consult [email or web address] for contact info under        Signed, Nobie Putnam, MD

## 2023-12-08 NOTE — Transfer of Care (Signed)
 Immediate Anesthesia Transfer of Care Note  Patient: Vanessa Wells  Procedure(s) Performed: EGD (ESOPHAGOGASTRODUODENOSCOPY) CONTROL OF HEMORRHAGE, GI TRACT, ENDOSCOPIC, BY CLIPPING OR OVERSEWING  Patient Location: PACU  Anesthesia Type:MAC  Level of Consciousness: awake  Airway & Oxygen Therapy: Patient Spontanous Breathing  Post-op Assessment: Report given to RN  Post vital signs: Reviewed  Last Vitals:  Vitals Value Taken Time  BP  99\72  Temp    Pulse  70  Resp  12  SpO2  99    Last Pain:  Vitals:   12/08/23 1015  TempSrc: Tympanic  PainSc: 0-No pain         Complications: No notable events documented.

## 2023-12-08 NOTE — Plan of Care (Signed)

## 2023-12-08 NOTE — Progress Notes (Signed)
 Transported off unit to Endoscopy with Endoscopy Transporter.

## 2023-12-08 NOTE — Progress Notes (Signed)
 Arrived back to unit from Endoscopy.  Report received from [facility] in Endo. Patient is to be strict NPO for remainder of the day.  Educated patient on plan of care to remain NPO for remainder of the day after Endoscopy.

## 2023-12-08 NOTE — Plan of Care (Signed)

## 2023-12-08 NOTE — Progress Notes (Signed)
 PROGRESS NOTE        PATIENT DETAILS Name: Vanessa Wells Age: 58 y.o. Sex: female Date of Birth: 12-30-1965 Admit Date: 12/06/2023 Admitting Physician Marinda Elk, MD ZOX:[id number], Adrian Saran, MD  Brief Summary: Patient is a 58 y.o.  female with history of STEMI requiring PCI to LAD on 3/14-on DAPT with aspirin/Brilinta-presenting with upper GI bleed and acute blood loss anemia.  Significant events: 3/28>> admit to [facility]  Significant studies: None  Significant microbiology data: None  Procedures: None  Consults: GI Cardiology  Subjective: Lying comfortably in bed-denies any chest pain or shortness of breath.  Abdominal pain is better.  No hematochezia/melena for the past several days.  Objective: Vitals: Blood pressure (!) 109/59, pulse 73, temperature 98.6 F (37 C), temperature source Oral, resp. rate 20, height 5\' 4"  (1.626 m), weight 63.5 kg, SpO2 100%.   Exam: Gen Exam:Alert awake-not in any distress HEENT:atraumatic, normocephalic Chest: B/L clear to auscultation anteriorly CVS:S1S2 regular Abdomen:soft non tender, non distended Extremities:no edema Neurology: Non focal Skin: no rash  Pertinent Labs/Radiology:    Latest Ref Rng & Units 12/08/2023    5:58 AM 12/07/2023    6:15 PM 12/07/2023    6:39 AM  CBC  WBC 4.0 - 10.5 K/uL 8.1  9.3  10.7   Hemoglobin 12.0 - 15.0 g/dL 8.7  7.5  7.9   Hematocrit 36.0 - 46.0 % 25.8  22.3  23.4   Platelets 150 - 400 K/uL 442  483  529     Lab Results  Component Value Date   NA 143 12/06/2023   K 3.7 12/06/2023   CL 106 12/06/2023   CO2 25 12/06/2023      Assessment/Plan: Upper GI bleed with acute blood loss anemia No BM/melena for the past several days Hb stable-has required a total of 2 units of PRBC-last transfused 3/29 Remains on PPI-epigastric pain has essentially resolved GI planning EGD later today  Recent STEMI with PCI to LAD on 3/14 (3 overlapping stents) Difficult  situation-Per cardiology-given that she is hemodynamically stable-bleeding appears to be slow-continue DAPT with aspirin/ticagrelor.  If bleeding would become brisk/hemodynamically unstable-then could potentially consider platelet transfusion for reversal and use cangrelor as a bridging option.  HTN BP soft Hold metoprolol  HLD Resume statin  Mood disorder Resume sertraline.  BMI: Estimated body mass index is 24.03 kg/m as calculated from the following:   Height as of this encounter: 5\' 4"  (1.626 m).   Weight as of this encounter: 63.5 kg.   Code status:   Code Status: Full Code   DVT Prophylaxis: SCDs Start: 12/07/23 0625   Family Communication: Daughter at bedside   Disposition Plan: Status is: Inpatient Remains inpatient appropriate because: Severity of illness   Planned Discharge Destination:Home   Diet: Diet Order             Diet NPO time specified Except for: Sips with Meds  Diet effective midnight                     Antimicrobial agents: Anti-infectives (From admission, onward)    None        MEDICATIONS: Scheduled Meds:  sodium chloride   Intravenous Once   aspirin  81 mg Oral Daily   pantoprazole (PROTONIX) IV  40 mg Intravenous Q12H   ticagrelor  90 mg Oral BID  Continuous Infusions: PRN Meds:.acetaminophen, ipratropium-albuterol, ondansetron (ZOFRAN) IV   I have personally reviewed following labs and imaging studies  LABORATORY DATA: CBC: Recent Labs  Lab 12/06/23 1759 12/06/23 1851 12/07/23 0639 12/07/23 1815 12/08/23 0558  WBC 9.8  --  10.7* 9.3 8.1  HGB 7.0* 7.5* 7.9* 7.5* 8.7*  HCT 21.6* 22.0* 23.4* 22.3* 25.8*  MCV 93.9  --  90.7 90.7 90.2  PLT 608*  --  529* 483* 442*    Basic Metabolic Panel: Recent Labs  Lab 12/06/23 1759 12/06/23 1851  NA 140 143  K 3.6 3.7  CL 106 106  CO2 25  --   GLUCOSE 111* 107*  BUN 11 12  CREATININE 0.55 0.50  CALCIUM 8.8*  --     GFR: Estimated Creatinine Clearance:  68.6 mL/min (by C-G formula based on SCr of 0.5 mg/dL).  Liver Function Tests: Recent Labs  Lab 12/07/23 0639  AST 17  ALT 12  ALKPHOS 46  BILITOT 0.6  PROT 6.1*  ALBUMIN 3.4*   Recent Labs  Lab 12/06/23 1837  LIPASE 19   No results for input(s): "AMMONIA" in the last 168 hours.  Coagulation Profile: Recent Labs  Lab 12/06/23 1837  INR 1.1    Cardiac Enzymes: No results for input(s): "CKTOTAL", "CKMB", "CKMBINDEX", "TROPONINI" in the last 168 hours.  BNP (last 3 results) No results for input(s): "PROBNP" in the last 8760 hours.  Lipid Profile: No results for input(s): "CHOL", "HDL", "LDLCALC", "TRIG", "CHOLHDL", "LDLDIRECT" in the last 72 hours.  Thyroid Function Tests: No results for input(s): "TSH", "T4TOTAL", "FREET4", "T3FREE", "THYROIDAB" in the last 72 hours.  Anemia Panel: No results for input(s): "VITAMINB12", "FOLATE", "FERRITIN", "TIBC", "IRON", "RETICCTPCT" in the last 72 hours.  Urine analysis:    Component Value Date/Time   COLORURINE YELLOW 12/07/2023 0639   APPEARANCEUR HAZY (A) 12/07/2023 0639   LABSPEC 1.029 12/07/2023 0639   PHURINE 5.0 12/07/2023 0639   GLUCOSEU NEGATIVE 12/07/2023 0639   HGBUR NEGATIVE 12/07/2023 0639   BILIRUBINUR NEGATIVE 12/07/2023 0639   KETONESUR 20 (A) 12/07/2023 0639   PROTEINUR 30 (A) 12/07/2023 0639   UROBILINOGEN 1.0 04/07/2009 2004   NITRITE NEGATIVE 12/07/2023 0639   LEUKOCYTESUR NEGATIVE 12/07/2023 0639    Sepsis Labs: Lactic Acid, Venous    Component Value Date/Time   LATICACIDVEN 1.1 12/06/2023 1846    MICROBIOLOGY: No results found for this or any previous visit (from the past 240 hours).  RADIOLOGY STUDIES/RESULTS: No results found.   LOS: 1 day   Jeoffrey Massed, MD  [facility]    To contact the attending provider between 7A-7P or the covering provider during after hours 7P-7A, please log into the web site [email or web address] and access using universal [place] password for that  web site. If you do not have the password, please call the hospital operator.  12/08/2023, 9:50 AM

## 2023-12-08 NOTE — Interval H&P Note (Signed)
 History and Physical Interval Note:  12/08/2023 10:21 AM  Vanessa Wells  has presented today for surgery, with the diagnosis of Melena and epigastric pain.  The various methods of treatment have been discussed with the patient and family. After consideration of risks, benefits and other options for treatment, the patient has consented to  Procedure(s): EGD (ESOPHAGOGASTRODUODENOSCOPY) (N/A) as a surgical intervention.  The patient's history has been reviewed, patient examined, no change in status, stable for surgery.  I have reviewed the patient's chart and labs.  Questions were answered to the patient's satisfaction.     Rommie Dunn D

## 2023-12-08 NOTE — Plan of Care (Signed)
 Alert and oriented. EGD procedure completed today. Patient came back to bedside and remains strict NPO. Ambulatory to bathroom with standby assist. No BM noted. Passing flatus.  Family remains at bedside.   Problem: Education: Goal: Knowledge of General Education information will improve Description: Including pain rating scale, medication(s)/side effects and non-pharmacologic comfort measures Outcome: Progressing   Problem: Health Behavior/Discharge Planning: Goal: Ability to manage health-related needs will improve Outcome: Progressing   Problem: Clinical Measurements: Goal: Ability to maintain clinical measurements within normal limits will improve Outcome: Progressing Goal: Will remain free from infection Outcome: Progressing Goal: Diagnostic test results will improve Outcome: Progressing

## 2023-12-09 ENCOUNTER — Other Ambulatory Visit (HOSPITAL_COMMUNITY): Payer: Self-pay

## 2023-12-09 DIAGNOSIS — I2510 Atherosclerotic heart disease of native coronary artery without angina pectoris: Secondary | ICD-10-CM | POA: Diagnosis not present

## 2023-12-09 DIAGNOSIS — K922 Gastrointestinal hemorrhage, unspecified: Secondary | ICD-10-CM | POA: Diagnosis not present

## 2023-12-09 DIAGNOSIS — I10 Essential (primary) hypertension: Secondary | ICD-10-CM | POA: Diagnosis not present

## 2023-12-09 DIAGNOSIS — D62 Acute posthemorrhagic anemia: Secondary | ICD-10-CM | POA: Diagnosis not present

## 2023-12-09 DIAGNOSIS — K284 Chronic or unspecified gastrojejunal ulcer with hemorrhage: Secondary | ICD-10-CM | POA: Diagnosis not present

## 2023-12-09 DIAGNOSIS — R1010 Upper abdominal pain, unspecified: Secondary | ICD-10-CM

## 2023-12-09 DIAGNOSIS — K289 Gastrojejunal ulcer, unspecified as acute or chronic, without hemorrhage or perforation: Secondary | ICD-10-CM

## 2023-12-09 DIAGNOSIS — Z7902 Long term (current) use of antithrombotics/antiplatelets: Secondary | ICD-10-CM

## 2023-12-09 DIAGNOSIS — K921 Melena: Secondary | ICD-10-CM | POA: Diagnosis not present

## 2023-12-09 LAB — CBC
HCT: 27.5 % — ABNORMAL LOW (ref 36.0–46.0)
Hemoglobin: 9.2 g/dL — ABNORMAL LOW (ref 12.0–15.0)
MCH: 30.9 pg (ref 26.0–34.0)
MCHC: 33.5 g/dL (ref 30.0–36.0)
MCV: 92.3 fL (ref 80.0–100.0)
Platelets: 470 10*3/uL — ABNORMAL HIGH (ref 150–400)
RBC: 2.98 MIL/uL — ABNORMAL LOW (ref 3.87–5.11)
RDW: 14.4 % (ref 11.5–15.5)
WBC: 6.9 10*3/uL (ref 4.0–10.5)
nRBC: 0 % (ref 0.0–0.2)

## 2023-12-09 MED ORDER — SUCRALFATE 1 GM/10ML PO SUSP
1.0000 g | Freq: Three times a day (TID) | ORAL | Status: DC
  Administered 2023-12-09 – 2023-12-10 (×4): 1 g via ORAL
  Filled 2023-12-09 (×4): qty 10

## 2023-12-09 MED ORDER — BOOST / RESOURCE BREEZE PO LIQD CUSTOM
1.0000 | Freq: Three times a day (TID) | ORAL | Status: DC
  Administered 2023-12-09 – 2023-12-15 (×4): 1 via ORAL

## 2023-12-09 NOTE — Progress Notes (Signed)
 Daily Progress Note  DOA: 12/06/2023 Hospital Day: 4   Chief Complaint: GI bleed  ASSESSMENT    58 y.o. year old female with a history of Roux -en-y in 2003. Hospitalized with symptomatic anemia  / upper abdominal pain and melena on Brillinta and ASA.   GI bleed   2/2 to oozing jejunal anastomotic ulcers, one with a nonbleeding visible vessel,  s/p injection and Purastat on 3/30 No BMs or GI bleeding overnight or this am.   ABLA S/p 2 u RBCs  Hgb improved from 7 to 9.2  CAD / recent NSTEMI s/p stenting 11/22/23 on Brillinta and ASA  HTN  PLAN   --Will start Carafate suspension 1 gram ac TID x 2 weeks ( normal renal function) --BID Pantoprazole for total of 4 weeks then once indefinitely --Start clear liquids  Interim History / Subjective   Feels okay. Wants to eat. Told could eat 24 hours after Purastat. No BMs / bleeding today. No abdominal pain     Prior Endoscopies  EGD 12/08/23 ( this admission) -- Normal esophagus.  Roux-en-Y gastrojejunostomy with gastrojejunal anastomosis characterized by friable mucosa and inflammation.  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  Injected.  No specimens collected  A colonoscopy was performed at [facility] in 02/2022 with a 6 minute withdrawal time and it was reported to be a normal examination. A 10 year follow up was recommended.    Objective    Recent Labs    12/08/23 0558 12/08/23 1631 12/09/23 0419  WBC 8.1 6.4 6.9  HGB 8.7* 9.0* 9.2*  HCT 25.8* 26.5* 27.5*  MCV 90.2 90.1 92.3  PLT 442* 453* 470*   No results for input(s): "FOLATE", "VITAMINB12", "FERRITIN", "TIBC", "IRONPCTSAT" in the last 72 hours. Recent Labs    12/06/23 1759 12/06/23 1851  NA 140 143  K 3.6 3.7  CL 106 106  CO2 25  --   GLUCOSE 111* 107*  BUN 11 12  CREATININE 0.55 0.50  CALCIUM 8.8*  --    Recent Labs    12/07/23 0639  PROT 6.1*  ALBUMIN 3.4*  AST 17  ALT 12  ALKPHOS 46  BILITOT 0.6  BILIDIR  0.1  IBILI 0.5   Recent Labs    12/06/23 1837  INR 1.1   No results for input(s): "AFPTUMOR" in the last 72 hours.   No results for input(s): "ANA" in the last 72 hours.  Imaging:  CT ABDOMEN PELVIS W CONTRAST CLINICAL DATA:  Bowel obstruction suspected upper abdominal pain constipation  EXAM: CT ABDOMEN AND PELVIS WITH CONTRAST  TECHNIQUE: Multidetector CT imaging of the abdomen and pelvis was performed using the standard protocol following bolus administration of intravenous contrast.  RADIATION DOSE REDUCTION: This exam was performed according to the departmental dose-optimization program which includes automated exposure control, adjustment of the mA and/or kV according to patient size and/or use of iterative reconstruction technique.  CONTRAST:  OMNIPAQUE IOHEXOL 300 MG/ML  SOLN  COMPARISON:  None Available.  FINDINGS: Lower chest: Lung bases demonstrate no acute airspace disease.  Hepatobiliary: 2.8 cm hypodense mass in the left hepatic lobe with patchy peripheral enhancement, progressive on delayed views and suggestive of a hemangioma. Surgical clips in the right upper quadrant. Mild intra hepatic and extrahepatic biliary dilatation. Common bile duct dilated up to 10 mm.  Pancreas: Unremarkable. No pancreatic ductal dilatation or surrounding inflammatory changes.  Spleen: Normal in size without focal abnormality.  Adrenals/Urinary Tract: Adrenal glands  are unremarkable. Kidneys are normal, without renal calculi, focal lesion, or hydronephrosis. Bladder is unremarkable.  Stomach/Bowel: Status post gastric bypass. No dilated small bowel. No acute bowel wall thickening. Negative appendix. Moderate stool burden  Vascular/Lymphatic: Aortic atherosclerosis. No enlarged abdominal or pelvic lymph nodes.  Reproductive: Status post hysterectomy. No adnexal masses.  Other: Negative for pelvic effusion  Musculoskeletal: No acute or suspicious osseous  abnormality  IMPRESSION: 1. No CT evidence for acute intra-abdominal or pelvic abnormality. No evidence for bowel obstruction 2. Status post gastric bypass. Moderate stool burden. 3. Mild intra and extrahepatic biliary dilatation, post cholecystectomy and probably related to this history but suggest correlation with LFTs. 4. 2.8 cm hypodense mass in the left hepatic lobe with imaging characteristics suggestive of a hemangioma. 5. Aortic atherosclerosis.  Electronically Signed   By: Jasmine Pang M.D.   On: 11/20/2023 22:38     Scheduled inpatient medications:   sodium chloride   Intravenous Once   aspirin  81 mg Oral Daily   atorvastatin  80 mg Oral Daily   pantoprazole (PROTONIX) IV  40 mg Intravenous Q12H   sertraline  100 mg Oral Daily   ticagrelor  90 mg Oral BID   Continuous inpatient infusions:  PRN inpatient medications: acetaminophen, ipratropium-albuterol, ondansetron (ZOFRAN) IV  Vital signs in last 24 hours: Temp:  [97.3 F (36.3 C)-98.6 F (37 C)] 98.2 F (36.8 C) (03/31 0322) Pulse Rate:  [64-79] 76 (03/31 0322) Resp:  [12-19] 18 (03/31 0322) BP: (93-120)/(56-83) 101/60 (03/31 0322) SpO2:  [98 %-100 %] 100 % (03/31 0322) Last BM Date : 12/06/23  Intake/Output Summary (Last 24 hours) at 12/09/2023 0826 Last data filed at 12/08/2023 1041 Gross per 24 hour  Intake 200 ml  Output 350 ml  Net -150 ml    Intake/Output from previous day: 03/30 0701 - 03/31 0700 In: 200 [I.V.:200] Out: 350 [Urine:350] Intake/Output this shift: No intake/output data recorded.   Physical Exam:  General: Alert female in NAD Heart:  Regular rate and rhythm.  Pulmonary: Normal respiratory effort Abdomen: Soft, nondistended, nontender. Normal bowel sounds. Extremities: No lower extremity edema  Neurologic: Alert and oriented Psych: Pleasant. Cooperative     LOS: 2 days   Willette Cluster ,NP 12/09/2023, 8:26 AM

## 2023-12-09 NOTE — Progress Notes (Addendum)
 PROGRESS NOTE        PATIENT DETAILS Name: Vanessa Wells Age: 58 y.o. Sex: female Date of Birth: Feb 03, 1966 Admit Date: 12/06/2023 Admitting Physician Marinda Elk, MD UJW:[id number], Adrian Saran, MD  Brief Summary: Patient is a 58 y.o.  female with history of STEMI requiring PCI to LAD on 3/14-on DAPT with aspirin/Brilinta-presenting with upper GI bleed and acute blood loss anemia.  Significant events: 3/28>> admit to [facility]  Significant studies: None  Significant microbiology data: None  Procedures: 3/30>> EGD: Oozing jejunal ulcers with known bleeding visible vessel  Consults: GI Cardiology  Subjective: Epigastric pain much better-has not had a BM overnight-in fact has not had a BM for the past 4-5 days.  Objective: Vitals: Blood pressure (!) 97/57, pulse 71, temperature 98.6 F (37 C), temperature source Oral, resp. rate 14, height 5\' 4"  (1.626 m), weight 63.5 kg, SpO2 100%.   Exam: Gen Exam:Alert awake-not in any distress HEENT:atraumatic, normocephalic Chest: B/L clear to auscultation anteriorly CVS:S1S2 regular Abdomen:soft non tender, non distended Extremities:no edema Neurology: Non focal Skin: no rash  Pertinent Labs/Radiology:    Latest Ref Rng & Units 12/09/2023    4:19 AM 12/08/2023    4:31 PM 12/08/2023    5:58 AM  CBC  WBC 4.0 - 10.5 K/uL 6.9  6.4  8.1   Hemoglobin 12.0 - 15.0 g/dL 9.2  9.0  8.7   Hematocrit 36.0 - 46.0 % 27.5  26.5  25.8   Platelets 150 - 400 K/uL 470  453  442     Lab Results  Component Value Date   NA 143 12/06/2023   K 3.7 12/06/2023   CL 106 12/06/2023   CO2 25 12/06/2023      Assessment/Plan: Upper GI bleed with acute blood loss anemia Secondary to bleeding jejunal ulcers No melanotic stools overnight Hb stable-last transfused 3/29 (2 units so far) PPI twice daily x 4 weeks then daily indefinitely Carafate x 14 days Clear liquids to be started today GI following Follow CBC.  Recent  STEMI with PCI to LAD on 3/14 (3 overlapping stents) Difficult situation-Per cardiology-given that she is hemodynamically stable-bleeding appears to be slow-continue DAPT with aspirin/ticagrelor.  If bleeding would become brisk/hemodynamically unstable-then could potentially consider platelet transfusion for reversal and use cangrelor as a bridging option.  HTN BP soft Hold metoprolol  HLD Continue statin  Mood disorder Continue sertraline.  BMI: Estimated body mass index is 24.03 kg/m as calculated from the following:   Height as of this encounter: 5\' 4"  (1.626 m).   Weight as of this encounter: 63.5 kg.   Code status:   Code Status: Full Code   DVT Prophylaxis: SCDs Start: 12/07/23 7829   Family Communication: Daughter at bedside   Disposition Plan: Status is: Inpatient Remains inpatient appropriate because: Severity of illness   Planned Discharge Destination:Home   Diet: Diet Order             Diet clear liquid Fluid consistency: Thin  Diet effective now                     Antimicrobial agents: Anti-infectives (From admission, onward)    None        MEDICATIONS: Scheduled Meds:  sodium chloride   Intravenous Once   aspirin  81 mg Oral Daily   atorvastatin  80 mg Oral  Daily   feeding supplement  1 Container Oral TID BM   pantoprazole (PROTONIX) IV  40 mg Intravenous Q12H   sertraline  100 mg Oral Daily   sucralfate  1 g Oral TID AC   ticagrelor  90 mg Oral BID   Continuous Infusions: PRN Meds:.acetaminophen, ipratropium-albuterol, ondansetron (ZOFRAN) IV   I have personally reviewed following labs and imaging studies  LABORATORY DATA: CBC: Recent Labs  Lab 12/07/23 0639 12/07/23 1815 12/08/23 0558 12/08/23 1631 12/09/23 0419  WBC 10.7* 9.3 8.1 6.4 6.9  HGB 7.9* 7.5* 8.7* 9.0* 9.2*  HCT 23.4* 22.3* 25.8* 26.5* 27.5*  MCV 90.7 90.7 90.2 90.1 92.3  PLT 529* 483* 442* 453* 470*    Basic Metabolic Panel: Recent Labs  Lab  12/06/23 1759 12/06/23 1851  NA 140 143  K 3.6 3.7  CL 106 106  CO2 25  --   GLUCOSE 111* 107*  BUN 11 12  CREATININE 0.55 0.50  CALCIUM 8.8*  --     GFR: Estimated Creatinine Clearance: 68.6 mL/min (by C-G formula based on SCr of 0.5 mg/dL).  Liver Function Tests: Recent Labs  Lab 12/07/23 0639  AST 17  ALT 12  ALKPHOS 46  BILITOT 0.6  PROT 6.1*  ALBUMIN 3.4*   Recent Labs  Lab 12/06/23 1837  LIPASE 19   No results for input(s): "AMMONIA" in the last 168 hours.  Coagulation Profile: Recent Labs  Lab 12/06/23 1837  INR 1.1    Cardiac Enzymes: No results for input(s): "CKTOTAL", "CKMB", "CKMBINDEX", "TROPONINI" in the last 168 hours.  BNP (last 3 results) No results for input(s): "PROBNP" in the last 8760 hours.  Lipid Profile: No results for input(s): "CHOL", "HDL", "LDLCALC", "TRIG", "CHOLHDL", "LDLDIRECT" in the last 72 hours.  Thyroid Function Tests: No results for input(s): "TSH", "T4TOTAL", "FREET4", "T3FREE", "THYROIDAB" in the last 72 hours.  Anemia Panel: No results for input(s): "VITAMINB12", "FOLATE", "FERRITIN", "TIBC", "IRON", "RETICCTPCT" in the last 72 hours.  Urine analysis:    Component Value Date/Time   COLORURINE YELLOW 12/07/2023 0639   APPEARANCEUR HAZY (A) 12/07/2023 0639   LABSPEC 1.029 12/07/2023 0639   PHURINE 5.0 12/07/2023 0639   GLUCOSEU NEGATIVE 12/07/2023 0639   HGBUR NEGATIVE 12/07/2023 0639   BILIRUBINUR NEGATIVE 12/07/2023 0639   KETONESUR 20 (A) 12/07/2023 0639   PROTEINUR 30 (A) 12/07/2023 0639   UROBILINOGEN 1.0 04/07/2009 2004   NITRITE NEGATIVE 12/07/2023 0639   LEUKOCYTESUR NEGATIVE 12/07/2023 0639    Sepsis Labs: Lactic Acid, Venous    Component Value Date/Time   LATICACIDVEN 1.1 12/06/2023 1846    MICROBIOLOGY: No results found for this or any previous visit (from the past 240 hours).  RADIOLOGY STUDIES/RESULTS: No results found.   LOS: 2 days   Jeoffrey Massed, MD  [facility]    To contact the attending provider between 7A-7P or the covering provider during after hours 7P-7A, please log into the web site [email or web address] and access using universal [place] password for that web site. If you do not have the password, please call the hospital operator.  12/09/2023, 10:25 AM

## 2023-12-09 NOTE — Progress Notes (Signed)
   Patient Name: Vanessa Wells Date of Encounter: 12/09/2023 [facility] Cardiologist: None   Interval Summary  .    Feeling much better today, no chest pain. Reports no further episodes of blood in stool but no BM since admission.   Vital Signs .    Vitals:   12/09/23 0322 12/09/23 0800 12/09/23 0840 12/09/23 0841  BP: 101/60 (!) 98/59  100/62  Pulse: 76  (!) 103 (!) 102  Resp: 18   14  Temp: 98.2 F (36.8 C)   98.6 F (37 C)  TempSrc: Oral   Oral  SpO2: 100%     Weight:      Height:        Intake/Output Summary (Last 24 hours) at 12/09/2023 0856 Last data filed at 12/08/2023 1041 Gross per 24 hour  Intake 200 ml  Output --  Net 200 ml      12/06/2023    5:48 PM 11/20/2023    3:20 PM 03/26/2018    9:00 AM  Last 3 Weights  Weight (lbs) 140 lb 159 lb 338 lb  Weight (kg) 63.504 kg 72.122 kg 153.316 kg      Telemetry/ECG    Sinus Rhythm - Personally Reviewed  Physical Exam .    GEN: No acute distress.   Neck: No JVD Cardiac: RRR, no murmurs, rubs, or gallops.  Respiratory: Clear to auscultation bilaterally. GI: Soft, nontender, non-distended  MS: No edema  Assessment & Plan .     58 y.o. female with a hx of anterior STEMI, hypertension, hyperlipidemia, pre DM,Roux-en- y who is being seen 12/07/2023 for assistance with management of DAPT in the setting of GI bleeds at the request of Dr. David Stall.   CAD s/p recent anterior STEMI -- [facility] and received DES x3 (total of 74mm stenting) to LAD. On ASA/Brilinta. No reports of chest pain.  -- presented with GI bleeding, see below. Hgb now improved therefore, will continue with ASA/Brilinta at this time. If further issues with bleeding, then would consider transition to plavix   -- continue statin   Acute GI Bleeding Dizziness -- Presented with hemoglobin of 7.0, transfused 2 units of PRBC -- Seen by GI, underwent upper endoscopy 3/30 with oozing jejunal ulcers  with nonbleeding visible vessel which were injected.  Recommendations for PPI twice daily dosing x 1 month then daily indefinitely along with sucralfate -- Hgb improved to 9.2  Hypertension -- Bps mostly soft, hold meds held at this time  HLD -- on statin  Of note, she does plan to follow up with cardiology through [facility]   For questions or updates, please contact [facility] Please consult [email or web address] for contact info under        Signed, Laverda Page, NP

## 2023-12-10 ENCOUNTER — Encounter (HOSPITAL_COMMUNITY)
Admission: EM | Disposition: A | Discharge status: Home / Self Care | Payer: Self-pay | Source: Home / Self Care | Attending: Internal Medicine

## 2023-12-10 ENCOUNTER — Inpatient Hospital Stay (HOSPITAL_COMMUNITY)

## 2023-12-10 ENCOUNTER — Encounter (HOSPITAL_COMMUNITY): Payer: Self-pay | Admitting: Gastroenterology

## 2023-12-10 DIAGNOSIS — G4733 Obstructive sleep apnea (adult) (pediatric): Secondary | ICD-10-CM | POA: Diagnosis not present

## 2023-12-10 DIAGNOSIS — K921 Melena: Secondary | ICD-10-CM

## 2023-12-10 DIAGNOSIS — Z9884 Bariatric surgery status: Secondary | ICD-10-CM

## 2023-12-10 DIAGNOSIS — Z980 Intestinal bypass and anastomosis status: Secondary | ICD-10-CM

## 2023-12-10 DIAGNOSIS — Z7902 Long term (current) use of antithrombotics/antiplatelets: Secondary | ICD-10-CM | POA: Diagnosis not present

## 2023-12-10 DIAGNOSIS — I10 Essential (primary) hypertension: Secondary | ICD-10-CM

## 2023-12-10 DIAGNOSIS — K922 Gastrointestinal hemorrhage, unspecified: Secondary | ICD-10-CM | POA: Diagnosis not present

## 2023-12-10 DIAGNOSIS — D62 Acute posthemorrhagic anemia: Secondary | ICD-10-CM | POA: Diagnosis not present

## 2023-12-10 DIAGNOSIS — K284 Chronic or unspecified gastrojejunal ulcer with hemorrhage: Principal | ICD-10-CM

## 2023-12-10 DIAGNOSIS — I2510 Atherosclerotic heart disease of native coronary artery without angina pectoris: Secondary | ICD-10-CM | POA: Diagnosis not present

## 2023-12-10 DIAGNOSIS — K289 Gastrojejunal ulcer, unspecified as acute or chronic, without hemorrhage or perforation: Secondary | ICD-10-CM

## 2023-12-10 HISTORY — PX: ESOPHAGOGASTRODUODENOSCOPY: SHX5428

## 2023-12-10 HISTORY — PX: HEMOSTASIS CLIP PLACEMENT: SHX6857

## 2023-12-10 LAB — CBC
HCT: 28.1 % — ABNORMAL LOW (ref 36.0–46.0)
HCT: 24.6 % — ABNORMAL LOW (ref 36.0–46.0)
Hemoglobin: 9.5 g/dL — ABNORMAL LOW (ref 12.0–15.0)
Hemoglobin: 8.1 g/dL — ABNORMAL LOW (ref 12.0–15.0)
MCH: 31.4 pg (ref 26.0–34.0)
MCH: 30.9 pg (ref 26.0–34.0)
MCHC: 33.8 g/dL (ref 30.0–36.0)
MCHC: 32.9 g/dL (ref 30.0–36.0)
MCV: 92.7 fL (ref 80.0–100.0)
MCV: 93.9 fL (ref 80.0–100.0)
Platelets: 482 10*3/uL — ABNORMAL HIGH (ref 150–400)
Platelets: 461 10*3/uL — ABNORMAL HIGH (ref 150–400)
RBC: 3.03 MIL/uL — ABNORMAL LOW (ref 3.87–5.11)
RBC: 2.62 MIL/uL — ABNORMAL LOW (ref 3.87–5.11)
RDW: 14.6 % (ref 11.5–15.5)
RDW: 14.5 % (ref 11.5–15.5)
WBC: 6.6 10*3/uL (ref 4.0–10.5)
WBC: 7.6 10*3/uL (ref 4.0–10.5)
nRBC: 0 % (ref 0.0–0.2)
nRBC: 0 % (ref 0.0–0.2)

## 2023-12-10 LAB — PREPARE RBC (CROSSMATCH)

## 2023-12-10 LAB — GLUCOSE, CAPILLARY: Glucose-Capillary: 92 mg/dL (ref 70–99)

## 2023-12-10 SURGERY — EGD (ESOPHAGOGASTRODUODENOSCOPY)
Anesthesia: General

## 2023-12-10 MED ORDER — SODIUM CHLORIDE 0.9% IV SOLUTION: Freq: Once | INTRAVENOUS | Status: AC

## 2023-12-10 MED ORDER — PROPOFOL 10 MG/ML IV BOLUS
INTRAVENOUS | Status: DC | PRN
  Administered 2023-12-10: 110 mg via INTRAVENOUS

## 2023-12-10 MED ORDER — PHENYLEPHRINE HCL-NACL 20-0.9 MG/250ML-% IV SOLN
INTRAVENOUS | Status: DC | PRN
  Administered 2023-12-10: 80 ug via INTRAVENOUS
  Administered 2023-12-10: 10 ug/min via INTRAVENOUS
  Administered 2023-12-10: 80 ug via INTRAVENOUS

## 2023-12-10 MED ORDER — SODIUM CHLORIDE 0.9 % IV SOLN
INTRAVENOUS | Status: DC
  Administered 2023-12-11: 1000 mL via INTRAVENOUS

## 2023-12-10 MED ORDER — LIDOCAINE 2% (20 MG/ML) 5 ML SYRINGE
INTRAMUSCULAR | Status: DC | PRN
  Administered 2023-12-10: 60 mg via INTRAVENOUS

## 2023-12-10 MED ORDER — PHENYLEPHRINE 80 MCG/ML (10ML) SYRINGE FOR IV PUSH (FOR BLOOD PRESSURE SUPPORT)
PREFILLED_SYRINGE | INTRAVENOUS | Status: DC | PRN
  Administered 2023-12-10: 80 ug via INTRAVENOUS

## 2023-12-10 MED ORDER — DEXAMETHASONE SODIUM PHOSPHATE 10 MG/ML IJ SOLN
INTRAMUSCULAR | Status: DC | PRN
Start: 2023-12-10 — End: 2023-12-10
  Administered 2023-12-10: 5 mg via INTRAVENOUS

## 2023-12-10 MED ORDER — SUCCINYLCHOLINE CHLORIDE 200 MG/10ML IV SOSY
PREFILLED_SYRINGE | INTRAVENOUS | Status: DC | PRN
  Administered 2023-12-10: 100 mg via INTRAVENOUS

## 2023-12-10 MED ORDER — EPINEPHRINE 1 MG/10ML IJ SOSY
PREFILLED_SYRINGE | INTRAMUSCULAR | Status: AC
  Filled 2023-12-10: qty 10

## 2023-12-10 MED ORDER — SODIUM CHLORIDE 0.9 % IV BOLUS
1000.0000 mL | Freq: Once | INTRAVENOUS | Status: AC
  Administered 2023-12-10: 1000 mL via INTRAVENOUS

## 2023-12-10 MED ORDER — ONDANSETRON HCL 4 MG/2ML IJ SOLN
INTRAMUSCULAR | Status: DC | PRN
  Administered 2023-12-10: 4 mg via INTRAVENOUS

## 2023-12-10 NOTE — Anesthesia Preprocedure Evaluation (Addendum)
 Anesthesia Evaluation  Patient identified by MRN, date of birth, ID band Patient awake    Reviewed: Allergy & Precautions, NPO status , Patient's Chart, lab work & pertinent test results  History of Anesthesia Complications Negative for: history of anesthetic complications  Airway Mallampati: II  TM Distance: >3 FB Neck ROM: Limited    Dental  (+) Dental Advisory Given, Missing   Pulmonary sleep apnea    Pulmonary exam normal        Cardiovascular hypertension, Pt. on medications and Pt. on home beta blockers pulmonary hypertension+ CAD, + Past MI and + Cardiac Stents  Normal cardiovascular exam   STEMI 2 wks ago with new stents placed  '25 TTE - There is mild concentric left ventricular hypertrophy. Severe apical and mild mid anterior and anteroseptal hypokinesis. LV ejection fraction = 55-60%. There is mild to moderate tricuspid regurgitation. Mild to moderate pulmonary hypertension. There is small to moderate size pericardial effusion without clear echocardiographic evidence of  tamponade.     Neuro/Psych  PSYCHIATRIC DISORDERS Anxiety      Social anxiety Schizotypal personality d/o negative neurological ROS     GI/Hepatic Neg liver ROS,,, S/p gastric bypass    Endo/Other  diabetes    Renal/GU negative Renal ROS     Musculoskeletal negative musculoskeletal ROS (+)    Abdominal   Peds  Hematology  (+) Blood dyscrasia, anemia  On brilinta    Anesthesia Other Findings   Reproductive/Obstetrics                             Anesthesia Physical Anesthesia Plan  ASA: 4 and emergent  Anesthesia Plan: General   Post-op Pain Management: Minimal or no pain anticipated   Induction: Rapid sequence, Cricoid pressure planned and Intravenous  PONV Risk Score and Plan: 2 and Treatment may vary due to age or medical condition, Ondansetron and Dexamethasone  Airway Management Planned:  Oral ETT  Additional Equipment: None  Intra-op Plan:   Post-operative Plan: Extubation in OR  Informed Consent: I have reviewed the patients History and Physical, chart, labs and discussed the procedure including the risks, benefits and alternatives for the proposed anesthesia with the patient or authorized representative who has indicated his/her understanding and acceptance.     Dental advisory given  Plan Discussed with: CRNA  Anesthesia Plan Comments:        Anesthesia Quick Evaluation

## 2023-12-10 NOTE — Progress Notes (Signed)
 Brief note  Notified by RN that patient had a syncopal event while in the bathroom and had associated large volume hematochezia with clots. BP initially BP 83/70. Type screen/stat cbc-IVF bolus and 1 unit of PRBC ordered.   BP now slowly improving upto 90's systolic-she is more awake and alert.   Discussed with GI MD Dr Stan Head will try to see if a EGD is possible.  Will monitor closely-if BP doesn't improve appropriately or if hematochezia recurs again-then will need PCCM eval.

## 2023-12-10 NOTE — Progress Notes (Signed)
 PROGRESS NOTE        PATIENT DETAILS Name: Vanessa Wells Age: 58 y.o. Sex: female Date of Birth: 06/08/1966 Admit Date: 12/06/2023 Admitting Physician Marinda Elk, MD [id number], Adrian Saran, MD  Brief Summary: Patient is a 58 y.o.  female with history of STEMI requiring PCI to LAD on 3/14-on DAPT with aspirin/Brilinta-presenting with upper GI bleed and acute blood loss anemia.  Significant events: 3/28>> admit to [facility]  Significant studies: None  Significant microbiology data: None  Procedures: 3/30>> EGD: Oozing jejunal ulcers with known bleeding visible vessel  Consults: GI Cardiology  Subjective: No complaints-no BM for the past several days.  Epigastric pain has essentially resolved.  Objective: Vitals: Blood pressure 109/72, pulse 70, temperature 98.1 F (36.7 C), temperature source Oral, resp. rate 15, height 5\' 4"  (1.626 m), weight 63.5 kg, SpO2 100%.   Exam: Gen Exam:Alert awake-not in any distress HEENT:atraumatic, normocephalic Chest: B/L clear to auscultation anteriorly CVS:S1S2 regular Abdomen:soft non tender, non distended Extremities:no edema Neurology: Non focal Skin: no rash  Pertinent Labs/Radiology:    Latest Ref Rng & Units 12/10/2023    4:32 AM 12/09/2023    4:19 AM 12/08/2023    4:31 PM  CBC  WBC 4.0 - 10.5 K/uL 6.6  6.9  6.4   Hemoglobin 12.0 - 15.0 g/dL 9.5  9.2  9.0   Hematocrit 36.0 - 46.0 % 28.1  27.5  26.5   Platelets 150 - 400 K/uL 482  470  453     Lab Results  Component Value Date   NA 143 12/06/2023   K 3.7 12/06/2023   CL 106 12/06/2023   CO2 25 12/06/2023      Assessment/Plan: Upper GI bleed with acute blood loss anemia Secondary to bleeding jejunal ulcers No melanotic stools overnight-in fact has not had a BM in the past several days. Hb stable-last transfused 3/29 (2 units so far) PPI twice daily x 4 weeks then daily indefinitely Carafate x 14 days Advance to full liquids-if  tolerates-plan is to advance to soft diet/tube and discharge home. Follow [facility].  Recent STEMI with PCI to LAD on 3/14 (3 overlapping stents) Reevaluated by Dr. Jacinto Halim on 3/31-no longer on aspirin-continue Brilinta.  Thankfully no further recurrence of GI bleeding at this point.   HTN BP soft-but only creeping up. Hold metoprolol-reassess 4/2.  HLD Continue statin  Mood disorder Continue sertraline.  BMI: Estimated body mass index is 24.03 kg/m as calculated from the following:   Height as of this encounter: 5\' 4"  (1.626 m).   Weight as of this encounter: 63.5 kg.   Code status:   Code Status: Full Code   DVT Prophylaxis: SCDs Start: 12/07/23 0625   Family Communication: None at bedside   Disposition Plan: Status is: Inpatient Remains inpatient appropriate because: Severity of illness   Planned Discharge Destination:Home   Diet: Diet Order             Diet full liquid Fluid consistency: Thin  Diet effective now                     Antimicrobial agents: Anti-infectives (From admission, onward)    None        MEDICATIONS: Scheduled Meds:  sodium chloride   Intravenous Once   atorvastatin  80 mg Oral Daily   feeding supplement  1 Container  Oral TID BM   pantoprazole (PROTONIX) IV  40 mg Intravenous Q12H   sertraline  100 mg Oral Daily   sucralfate  1 g Oral TID AC   ticagrelor  90 mg Oral BID   Continuous Infusions: PRN Meds:.acetaminophen, ipratropium-albuterol, ondansetron (ZOFRAN) IV   I have personally reviewed following labs and imaging studies  LABORATORY DATA: CBC: Recent Labs  Lab 12/07/23 1815 12/08/23 0558 12/08/23 1631 12/09/23 0419 12/10/23 0432  WBC 9.3 8.1 6.4 6.9 6.6  HGB 7.5* 8.7* 9.0* 9.2* 9.5*  HCT 22.3* 25.8* 26.5* 27.5* 28.1*  MCV 90.7 90.2 90.1 92.3 92.7  PLT 483* 442* 453* 470* 482*    Basic Metabolic Panel: Recent Labs  Lab 12/06/23 1759 12/06/23 1851  NA 140 143  K 3.6 3.7  CL 106 106  CO2 25  --    GLUCOSE 111* 107*  BUN 11 12  CREATININE 0.55 0.50  CALCIUM 8.8*  --     GFR: Estimated Creatinine Clearance: 68.6 mL/min (by C-G formula based on SCr of 0.5 mg/dL).  Liver Function Tests: Recent Labs  Lab 12/07/23 0639  AST 17  ALT 12  ALKPHOS 46  BILITOT 0.6  PROT 6.1*  ALBUMIN 3.4*   Recent Labs  Lab 12/06/23 1837  LIPASE 19   No results for input(s): "AMMONIA" in the last 168 hours.  Coagulation Profile: Recent Labs  Lab 12/06/23 1837  INR 1.1    Cardiac Enzymes: No results for input(s): "CKTOTAL", "CKMB", "CKMBINDEX", "TROPONINI" in the last 168 hours.  BNP (last 3 results) No results for input(s): "PROBNP" in the last 8760 hours.  Lipid Profile: No results for input(s): "CHOL", "HDL", "LDLCALC", "TRIG", "CHOLHDL", "LDLDIRECT" in the last 72 hours.  Thyroid Function Tests: No results for input(s): "TSH", "T4TOTAL", "FREET4", "T3FREE", "THYROIDAB" in the last 72 hours.  Anemia Panel: No results for input(s): "VITAMINB12", "FOLATE", "FERRITIN", "TIBC", "IRON", "RETICCTPCT" in the last 72 hours.  Urine analysis:    Component Value Date/Time   COLORURINE YELLOW 12/07/2023 0639   APPEARANCEUR HAZY (A) 12/07/2023 0639   LABSPEC 1.029 12/07/2023 0639   PHURINE 5.0 12/07/2023 0639   GLUCOSEU NEGATIVE 12/07/2023 0639   HGBUR NEGATIVE 12/07/2023 0639   BILIRUBINUR NEGATIVE 12/07/2023 0639   KETONESUR 20 (A) 12/07/2023 0639   PROTEINUR 30 (A) 12/07/2023 0639   UROBILINOGEN 1.0 04/07/2009 2004   NITRITE NEGATIVE 12/07/2023 0639   LEUKOCYTESUR NEGATIVE 12/07/2023 0639    Sepsis Labs: Lactic Acid, Venous    Component Value Date/Time   LATICACIDVEN 1.1 12/06/2023 1846    MICROBIOLOGY: No results found for this or any previous visit (from the past 240 hours).  RADIOLOGY STUDIES/RESULTS: No results found.   LOS: 3 days   Jeoffrey Massed, MD  [facility]    To contact the attending provider between 7A-7P or the covering provider during  after hours 7P-7A, please log into the web site [email or web address] and access using universal [place] password for that web site. If you do not have the password, please call the hospital operator.  12/10/2023, 11:54 AM

## 2023-12-10 NOTE — Plan of Care (Signed)

## 2023-12-10 NOTE — TOC CM/SW Note (Signed)
 Transition of Care [facility]) - Inpatient Brief Assessment   Patient Details  Name: Vanessa Wells MRN: [id number] Date of Birth: 24-Dec-1965  [facility] CM/SW Contact:    Mearl Latin, LCSW Phone Number: 12/10/2023, 10:02 AM   Clinical Narrative: Patient admitted from home undergoing workup for GI bleed. No current TOC needs identified at this time but please place consult if needed.    Transition of Care Asessment: Insurance and Status: Insurance coverage has been reviewed Patient has primary care physician: Yes Home environment has been reviewed: From home Prior level of function:: Independent Prior/Current Home Services: No current home services Social Drivers of Health Review: SDOH reviewed no interventions necessary Readmission risk has been reviewed: Yes Transition of care needs: no transition of care needs at this time

## 2023-12-10 NOTE — Anesthesia Procedure Notes (Signed)
 Procedure Name: Intubation Date/Time: 12/10/2023 5:45 PM  Performed by: Cy Blamer, CRNAPre-anesthesia Checklist: Patient identified, Emergency Drugs available, Suction available and Patient being monitored Patient Re-evaluated:Patient Re-evaluated prior to induction Oxygen Delivery Method: Circle system utilized Preoxygenation: Pre-oxygenation with 100% oxygen Induction Type: IV induction and Rapid sequence Laryngoscope Size: Glidescope and 3 Grade View: Grade I Tube type: Oral Tube size: 7.0 mm Number of attempts: 1 Airway Equipment and Method: Video-laryngoscopy, Rigid stylet and Bite block Placement Confirmation: ETT inserted through vocal cords under direct vision, positive ETCO2 and breath sounds checked- equal and bilateral Secured at: 21 cm Tube secured with: Tape Dental Injury: Teeth and Oropharynx as per pre-operative assessment  Comments: RSI for hematemesis

## 2023-12-10 NOTE — Progress Notes (Signed)
 Patient underwent EGD the evening 6:30 PM.  EGD report and gastroenterology recommendation following: -Impression:               - Z-line regular.                           - Normal esophagus otherwise                           - Roux-en-Y gastrojejunostomy with gastrojejunal                            anastomosis characterized by 3 areas of ulceration,                            2 clean based, the largest with large adherent clot                            which was partially removed as outlined and treated                            with Purastat gel.                           - Normal examined jejunal limb Recommendation:           - Return patient to hospital ward for ongoing care.                           - NPO.                           - Continue present medications.                           - 1 unit PRBC transfusion (has already been ordered                            but pending given antibodies?) given cardiac                            history and to give some reserve if she rebleeds                           - Continue IV Protonix BID                           - If patient rebleeds, consider IR consultation or                            general surgery evaluation     Plan - Plan to continue Brilinta given patient has a ST MI with LAD stent placement 2 weeks ago.  Aspirin has been discontinued by cardiology and recommended continue Brilinta. -Continue IV Protonix 40 mg twice daily. -1 unit of blood transfusion has been ordered by GI. -If patient rebleeds need to inform IR or general surgery. -Patient n.p.o. with  sips with meds and ice chips.

## 2023-12-10 NOTE — Progress Notes (Signed)
 GI UPDATE:  I was called by Dr. Jerral Ralph to update me on this patient.  She had been doing well when I saw her this morning, we started her on full liquid diet, she ate grits and orange juice around 1030.  Unfortunately late this afternoon she passed bright red blood per rectum with clots and had a syncopal episode.  She became hypotensive and resuscitated with a liter fluid bolus with improvement in hemodynamics.  Her mental status is appropriate.  She has not had any nausea or vomiting.  I discussed the situation with the patient.  Unfortunately she must remain on Brilinta given her recent coronary stenting and at risk for recurrent bleeding.  She had severe marginal ulcers noted on recent EGD with Dr. Elnoria Howard.  Given her clinical change and significant bleeding this afternoon I am recommending an upper endoscopy this evening to reassess the site and apply endoscopic therapy if amenable.  I discussed risks and benefits of the procedure and anesthesia with her.  I have asked anesthesia to help [place] with this case and intubate her to protect her airway in light of potentially significant blood clot burden in her stomach.  Hopefully we can proceed with this as soon as possible.  We have ordered 1 unit of blood and that is pending (she has antibody formation, hopefully can start blood within the hour).  Risks of this include hemodynamic instability, inability to clear her stomach or control bleeding, in addition to risks of perforation etc.  Hopefully we are successful at controlling this bleeding, if not we will need to consult IR and or surgery.  Very difficult situation in the setting of continued antiplatelet use.  Patient understands and agrees with the plan.  Further recommendations pending the results.  Harlin Rain, MD [facility]

## 2023-12-10 NOTE — Progress Notes (Signed)
      Progress Note   Subjective  Doing well today. No further bleeding. Tolerating clears.    Objective   Vital signs in last 24 hours: Temp:  [98.1 F (36.7 C)-99.1 F (37.3 C)] 98.1 F (36.7 C) (04/01 0400) Pulse Rate:  [65-77] 70 (04/01 0800) Resp:  [15-20] 15 (04/01 0000) BP: (104-127)/(70-80) 109/72 (04/01 0800) SpO2:  [99 %-100 %] 100 % (04/01 0800) Last BM Date : 12/06/23 General:    AA female in NAD Neurologic:  Alert and oriented,  grossly normal neurologically. Psych:  Cooperative. Normal mood and affect.  Intake/Output from previous day: 03/31 0701 - 04/01 0700 In: 0  Out: 1200 [Urine:1200] Intake/Output this shift: No intake/output data recorded.  Lab Results: Recent Labs    12/08/23 1631 12/09/23 0419 12/10/23 0432  WBC 6.4 6.9 6.6  HGB 9.0* 9.2* 9.5*  HCT 26.5* 27.5* 28.1*  PLT 453* 470* 482*   BMET No results for input(s): "NA", "K", "CL", "CO2", "GLUCOSE", "BUN", "CREATININE", "CALCIUM" in the last 72 hours. LFT No results for input(s): "PROT", "ALBUMIN", "AST", "ALT", "ALKPHOS", "BILITOT", "BILIDIR", "IBILI" in the last 72 hours. PT/INR No results for input(s): "LABPROT", "INR" in the last 72 hours.  Studies/Results: No results found.     Assessment / Plan:    58 y/o female here with the following:  Marginal ulcer causing GI bleeding Post hemorrhagic anemia Antiplatelet use Gastric bypass  Marginal ulcer treated endoscopically per Dr. Elnoria Howard with Purastat on 3/30. Occurred in the setting of Brilinta and aspirin. The aspirin has been stopped. No further bleeding, tolerating clears.  Brilinta has been continued throughout given her recent stenting, will continue this as long as no further overt bleeding. Hgb stable. Would keep her in house for 72 hours of IV PPI post procedure, so would be okay to discharge tomorrow. Can advance to full liquid diet today and then soft tomorrow if she continues to do well.   Avoidance of all NSAIDs  moving forward. Upon discharge will need protonix 40mg  BID for 6 weeks and then once daily thereafter. I will see her in follow up in the next 1-2 months and coordinate EGD to assess for mucosal healing as outpatient in a few months.  PLAN: - full liquid diet today - advance to soft diet tomorrow and then stay on that for several days - continue IV protonix 40mg  BID while inpatient - continue carafate - discharge likely tomorrow PM if she does well overnight - transition to oral protonix 40mg  PO BID for 6 weeks as outpatient and then once daily thereafter - avoid all NSAIDs - use tylenol PRN for aches / pains - our office will coordinate outpatient follow up after discharge - repeat EGD in a few months to assess for mucosal healing  Call with questions. We will sign off for now.  Harlin Rain, MD [facility]

## 2023-12-10 NOTE — Op Note (Signed)
 [facility] Patient Name: Vanessa Wells Procedure Date : 12/10/2023 MRN: [id number] Attending MD: Willaim Rayas. Adela Lank , MD, [id number] Date of Birth: [birth year] CSN: [id number] Age: 58 Admit Type: Inpatient Procedure:                Upper GI endoscopy Indications:              Suspected upper gastrointestinal bleeding - recent                            marginal ulcers treated with Purastat on 3/30. Have                            not been able to stop Brinlinta given recent                            coronary stenting, but off aspirin. Had been                            recovering okay, rebled this evening with syncope                            and dropped Hgb by a point. Urgent EGD to                            re-evaluate. Of note, blood ordered but none                            available in our hospital due to antibody                            formation, blood coming from [place] and will                            not be here for a few hours. Providers:                Willaim Rayas. Adela Lank, MD, Lorenza Evangelist, RN,                            Kandice Robinsons, Technician Referring MD:              Medicines:                Monitored Anesthesia Care Complications:            No immediate complications. Estimated blood loss:                            Minimal. Estimated Blood Loss:     Estimated blood loss was minimal. Procedure:                Pre-Anesthesia Assessment:                           - Prior to the procedure, a History and Physical  was performed, and patient medications and                            allergies were reviewed. The patient's tolerance of                            previous anesthesia was also reviewed. The risks                            and benefits of the procedure and the sedation                            options and risks were discussed with the patient.                            All questions were  answered, and informed consent                            was obtained. Prior Anticoagulants: The patient has                            taken Brilinta (ticagrelor), last dose was day of                            procedure. ASA Grade Assessment: IV - A patient                            with severe systemic disease that is a constant                            threat to life. After reviewing the risks and                            benefits, the patient was deemed in satisfactory                            condition to undergo the procedure.                           After obtaining informed consent, the endoscope was                            passed under direct vision. Throughout the                            procedure, the patient's blood pressure, pulse, and                            oxygen saturations were monitored continuously. The                            [id number] ([id number]) Therapeutic endoscope was  introduced through the mouth, and advanced to the                            proximal jejunum. The upper GI endoscopy was                            accomplished without difficulty. The patient                            tolerated the procedure well. Scope In: Scope Out: Findings:      The Z-line was regular.      The exam of the esophagus was otherwise normal.      Evidence of a Roux-en-Y gastrojejunostomy was found. The gastrojejunal       anastomosis was characterized by 3 areas of ulceration. 2 clean based       areas (one in 12 oclock position about 1cm in size) and another more       linear ulcer at the 3 'clock position - both of these areas were clean       based. A larger roughly 2cm ulcer with large adherent clot was noted in       the 7 oclock position on the jejunal side of the anastomosis. Fresh       blood was noted in the area but no active bleeding. I was able to       suction off most of the clot (using therapeutic endoscope) but a  flat       adherent clot was not able to be washed off the base which took up most       of the base of the ulcer. I could not identify a focal area of bleeding       or stigmata for bleeding within the base of the ulcer after several       minutes of lavaging and suctioning. I did not try to remove the base of       the clot on the ulcer bed given she stopped bleeding on her own, and       doing this could precipitate massive bleeding and blood transfusion is       not readily available due to antibodies. Given the location of the ulcer       and size of it, I did not think amenable to hemostasis clips. I did not       use epinephrine given the patient's recent coronary stenting. I elected       to treat the area with Purastat which filled the ulcer bed with a good       application. There was no bleeding at the end of the procedure.      There was residual blood in the gastric pouch which was lavaged. The       exam of the gastric pouch was otherwise normal.      The examined jejunal limb was normal. Impression:               - Z-line regular.                           - Normal esophagus otherwise                           -  Roux-en-Y gastrojejunostomy with gastrojejunal                            anastomosis characterized by 3 areas of ulceration,                            2 clean based, the largest with large adherent clot                            which was partially removed as outlined and treated                            with Purastat gel.                           - Normal examined jejunal limb Recommendation:           - Return patient to hospital ward for ongoing care.                           - NPO.                           - Continue present medications.                           - 1 unit PRBC transfusion (has already been ordered                            but pending given antibodies?) given cardiac                            history and to give some reserve if she  rebleeds                           - Continue IV Protonix BID                           - If patient rebleeds, consider IR consultation or                            general surgery evaluation                           - GI service will continue to follow Procedure Code(s):        --- Professional ---                           [phone], Esophagogastroduodenoscopy, flexible,                            transoral; with control of bleeding, any method Diagnosis Code(s):        --- Professional ---                           Z98.0, Intestinal bypass and anastomosis status  CPT copyright 2022 [employer]. All rights reserved. The codes documented in this report are preliminary and upon coder review may  be revised to meet current compliance requirements. Vanessa Spare P. Lilyann Gravelle, MD 12/10/2023 6:20:56 PM This report has been signed electronically. Number of Addenda: 0

## 2023-12-10 NOTE — Transfer of Care (Signed)
 Immediate Anesthesia Transfer of Care Note  Patient: Vanessa Wells  Procedure(s) Performed: EGD (ESOPHAGOGASTRODUODENOSCOPY) CONTROL OF HEMORRHAGE, GI TRACT, ENDOSCOPIC, BY CLIPPING OR OVERSEWING  Patient Location: PACU  Anesthesia Type:General  Level of Consciousness: awake, alert , and oriented  Airway & Oxygen Therapy: Patient Spontanous Breathing  Post-op Assessment: Report given to RN, Post -op Vital signs reviewed and stable, Patient moving all extremities X 4, and Patient able to stick tongue midline  Post vital signs: Reviewed and stable  Last Vitals:  Vitals Value Taken Time  BP 105/55   Temp 98.6   Pulse 75   Resp 15   SpO2 100     Last Pain:  Vitals:   12/10/23 1725  TempSrc: Temporal  PainSc: 0-No pain         Complications: No notable events documented.

## 2023-12-11 DIAGNOSIS — Z7902 Long term (current) use of antithrombotics/antiplatelets: Secondary | ICD-10-CM | POA: Diagnosis not present

## 2023-12-11 DIAGNOSIS — I2510 Atherosclerotic heart disease of native coronary artery without angina pectoris: Secondary | ICD-10-CM | POA: Diagnosis not present

## 2023-12-11 DIAGNOSIS — D62 Acute posthemorrhagic anemia: Secondary | ICD-10-CM | POA: Diagnosis not present

## 2023-12-11 DIAGNOSIS — K922 Gastrointestinal hemorrhage, unspecified: Secondary | ICD-10-CM | POA: Diagnosis not present

## 2023-12-11 DIAGNOSIS — K284 Chronic or unspecified gastrojejunal ulcer with hemorrhage: Secondary | ICD-10-CM | POA: Diagnosis not present

## 2023-12-11 DIAGNOSIS — Z9884 Bariatric surgery status: Secondary | ICD-10-CM | POA: Diagnosis not present

## 2023-12-11 DIAGNOSIS — I10 Essential (primary) hypertension: Secondary | ICD-10-CM | POA: Diagnosis not present

## 2023-12-11 LAB — CBC
HCT: 24.9 % — ABNORMAL LOW (ref 36.0–46.0)
HCT: 23.1 % — ABNORMAL LOW (ref 36.0–46.0)
HCT: 22.3 % — ABNORMAL LOW (ref 36.0–46.0)
HCT: 20 % — ABNORMAL LOW (ref 36.0–46.0)
Hemoglobin: 8.5 g/dL — ABNORMAL LOW (ref 12.0–15.0)
Hemoglobin: 7.8 g/dL — ABNORMAL LOW (ref 12.0–15.0)
Hemoglobin: 7.5 g/dL — ABNORMAL LOW (ref 12.0–15.0)
Hemoglobin: 6.8 g/dL — CL (ref 12.0–15.0)
MCH: 31.1 pg (ref 26.0–34.0)
MCH: 31 pg (ref 26.0–34.0)
MCH: 30.9 pg (ref 26.0–34.0)
MCH: 31.5 pg (ref 26.0–34.0)
MCHC: 34.1 g/dL (ref 30.0–36.0)
MCHC: 33.8 g/dL (ref 30.0–36.0)
MCHC: 33.6 g/dL (ref 30.0–36.0)
MCHC: 34 g/dL (ref 30.0–36.0)
MCV: 91.2 fL (ref 80.0–100.0)
MCV: 91.7 fL (ref 80.0–100.0)
MCV: 91.8 fL (ref 80.0–100.0)
MCV: 92.6 fL (ref 80.0–100.0)
Platelets: 392 10*3/uL (ref 150–400)
Platelets: 352 10*3/uL (ref 150–400)
Platelets: 347 10*3/uL (ref 150–400)
Platelets: 333 10*3/uL (ref 150–400)
RBC: 2.73 MIL/uL — ABNORMAL LOW (ref 3.87–5.11)
RBC: 2.52 MIL/uL — ABNORMAL LOW (ref 3.87–5.11)
RBC: 2.43 MIL/uL — ABNORMAL LOW (ref 3.87–5.11)
RBC: 2.16 MIL/uL — ABNORMAL LOW (ref 3.87–5.11)
RDW: 14.7 % (ref 11.5–15.5)
RDW: 14.9 % (ref 11.5–15.5)
RDW: 15.2 % (ref 11.5–15.5)
RDW: 15.2 % (ref 11.5–15.5)
WBC: 7.2 10*3/uL (ref 4.0–10.5)
WBC: 6.9 10*3/uL (ref 4.0–10.5)
WBC: 9.6 10*3/uL (ref 4.0–10.5)
WBC: 8.9 10*3/uL (ref 4.0–10.5)
nRBC: 0 % (ref 0.0–0.2)
nRBC: 0 % (ref 0.0–0.2)
nRBC: 0 % (ref 0.0–0.2)
nRBC: 0 % (ref 0.0–0.2)

## 2023-12-11 LAB — BASIC METABOLIC PANEL WITH GFR
Anion gap: 9 (ref 5–15)
BUN: 10 mg/dL (ref 6–20)
CO2: 20 mmol/L — ABNORMAL LOW (ref 22–32)
Calcium: 7.8 mg/dL — ABNORMAL LOW (ref 8.9–10.3)
Chloride: 111 mmol/L (ref 98–111)
Creatinine, Ser: 0.51 mg/dL (ref 0.44–1.00)
GFR, Estimated: >60 mL/min (ref >60)
Glucose, Bld: 109 mg/dL — ABNORMAL HIGH (ref 70–99)
Potassium: 4 mmol/L (ref 3.5–5.1)
Sodium: 140 mmol/L (ref 135–145)

## 2023-12-11 LAB — PREPARE RBC (CROSSMATCH)

## 2023-12-11 MED ORDER — SUCRALFATE 1 G PO TABS
1.0000 g | ORAL_TABLET | Freq: Four times a day (QID) | ORAL | Status: DC
  Administered 2023-12-11 – 2023-12-16 (×21): 1 g via ORAL
  Filled 2023-12-11 (×21): qty 1

## 2023-12-11 MED ORDER — CLOPIDOGREL BISULFATE 75 MG PO TABS
75.0000 mg | ORAL_TABLET | Freq: Every day | ORAL | Status: DC
  Administered 2023-12-11 – 2023-12-16 (×6): 75 mg via ORAL
  Filled 2023-12-11 (×6): qty 1

## 2023-12-11 MED ORDER — SODIUM CHLORIDE 0.9% IV SOLUTION: Freq: Once | INTRAVENOUS | Status: AC

## 2023-12-11 NOTE — Progress Notes (Signed)
 Received critical hgb of 6.8.  Dr Antionette Char notified.  Will continue to monitor.

## 2023-12-11 NOTE — Progress Notes (Signed)
      Progress Note   Subjective  Significant rebleeding yesterday PM. Repeat EGD done last night. She did well since then without recurrent bleeding. Had 1 unit PRBC transfusion.    Objective   Vital signs in last 24 hours: Temp:  [97.9 F (36.6 C)-98.9 F (37.2 C)] 98.7 F (37.1 C) (04/02 0400) Pulse Rate:  [66-108] 68 (04/02 0400) Resp:  [12-19] 17 (04/02 0400) BP: (83-112)/(53-81) 100/65 (04/02 0400) SpO2:  [100 %] 100 % (04/02 0400) Weight:  [63.5 kg] 63.5 kg (04/01 1725) Last BM Date : 12/10/23 General:    AA female in NAD Neurologic:  Alert and oriented,  grossly normal neurologically. Psych:  Cooperative. Normal mood and affect.  Intake/Output from previous day: 04/01 0701 - 04/02 0700 In: 1805.3 [I.V.:1332.4; Blood:472.9] Out: -  Intake/Output this shift: No intake/output data recorded.  Lab Results: Recent Labs    12/10/23 0432 12/10/23 1617 12/11/23 0435  WBC 6.6 7.6 7.2  HGB 9.5* 8.1* 8.5*  HCT 28.1* 24.6* 24.9*  PLT 482* 461* 392   BMET No results for input(s): "NA", "K", "CL", "CO2", "GLUCOSE", "BUN", "CREATININE", "CALCIUM" in the last 72 hours. LFT No results for input(s): "PROT", "ALBUMIN", "AST", "ALT", "ALKPHOS", "BILITOT", "BILIDIR", "IBILI" in the last 72 hours. PT/INR No results for input(s): "LABPROT", "INR" in the last 72 hours.  Studies/Results: No results found.     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 Elnoria Howard with Purastat on 3/30. Occurred in the setting of Brilinta and aspirin. The aspirin has been stopped. She unfortunately had significant rebleeding yesterday. Emergent EGD done last night 4/1 - large marginal ulcers x 3. 2 were clean based, one with large adherent clot, removed to base of  the ulcer bed where there was a flat adherent clot that could not be flushed off. We did not have access to a blood transfusion during time of the procedure (due to antibodies RBC transfusion came from [place], 2 hour delay). Given she had stopped bleeding at the time of the exam, I did not remove the clot. This area is just distal to surgical anastomosis on the jejunal limb side, not easily amenable to clipping, I thought would cause trauma to the area. Treated again with Purastat.  No further bleeding overnight, Hgb remains in 8s post transfusion. Have not held Brilinta given recent stenting and concern for stent thrombosis. Aspirin has been held. Remains at risk for rebleeding, hopefully with more time on IV PPI / carafate this will heal. May consider relook endoscopy in a few days, she will be here at least another 3 days post repeat EGD given her risks for rebleeding.      PLAN: - clear liquid diet today - continue IV protonix 40mg  BID while inpatient - continue carafate q 6 hours - avoid all NSAIDs - trend Hgb, monitor for rebleeding.    Call with questions. Will follow   Harlin Rain, MD [facility]

## 2023-12-11 NOTE — Plan of Care (Signed)
  Problem: Clinical Measurements: Goal: Complications related to the disease process, condition or treatment will be avoided or minimized Outcome: Progressing   Problem: Fluid Volume: Goal: Will show no signs and symptoms of excessive bleeding Outcome: Progressing   Problem: Bowel/Gastric: Goal: Will show no signs and symptoms of gastrointestinal bleeding Outcome: Progressing   Problem: Education: Goal: Ability to identify signs and symptoms of gastrointestinal bleeding will improve Outcome: Progressing   Problem: Clinical Measurements: Goal: Ability to maintain clinical measurements within normal limits will improve Outcome: Progressing Goal: Diagnostic test results will improve Outcome: Progressing Goal: Respiratory complications will improve Outcome: Progressing Goal: Cardiovascular complication will be avoided Outcome: Progressing

## 2023-12-11 NOTE — Progress Notes (Addendum)
 PROGRESS NOTE        PATIENT DETAILS Name: Vanessa Wells Age: 58 y.o. Sex: female Date of Birth: Jul 26, 1966 Admit Date: 12/06/2023 Admitting Physician Marinda Elk, MD [id number], Adrian Saran, MD  Brief Summary: Patient is a 58 y.o.  female with history of STEMI requiring PCI to LAD on 3/14-on DAPT with aspirin/Brilinta-presenting with upper GI bleed and acute blood loss anemia.  Significant events: 3/28>> admit to [facility] 3/30>> EGD with oozing jejunal ulcer 4/1>> diet advanced to full liquids-unfortunately had frank hematochezia with hypotension and syncope.  Repeat EGD ulceration ingestional with adherent clot-s/p treatment with purastat  Significant studies: None  Significant microbiology data: None  Procedures: 3/30>> EGD: Oozing jejunal ulcers with known bleeding visible vessel-treated with purastat 4/01>> EGD: 3 areas of ulceration-2 clean-based-largest with adherent clot treated with purastat  Consults: GI Cardiology  Subjective: No major issues overnight-no melena or hematochezia feels better.  Objective: Vitals: Blood pressure 95/61, pulse 76, temperature 98.4 F (36.9 C), resp. rate 18, height 5\' 4"  (1.626 m), weight 63.5 kg, SpO2 100%.   Exam: Gen Exam:Alert awake-not in any distress HEENT:atraumatic, normocephalic Chest: B/L clear to auscultation anteriorly CVS:S1S2 regular Abdomen:soft non tender, non distended Extremities:no edema Neurology: Non focal Skin: no rash  Pertinent Labs/Radiology:    Latest Ref Rng & Units 12/11/2023    7:32 AM 12/11/2023    4:35 AM 12/10/2023    4:17 PM  CBC  WBC 4.0 - 10.5 K/uL 6.9  7.2  7.6   Hemoglobin 12.0 - 15.0 g/dL 7.8  8.5  8.1   Hematocrit 36.0 - 46.0 % 23.1  24.9  24.6   Platelets 150 - 400 K/uL 352  392  461     Lab Results  Component Value Date   NA 140 12/11/2023   K 4.0 12/11/2023   CL 111 12/11/2023   CO2 20 (L) 12/11/2023      Assessment/Plan: Upper GI bleed with  acute blood loss anemia Secondary to bleeding jejunal ulcers in the setting of DAPT use for recent STEMI Was doing well-unfortunately had a rebleed on 4/1-repeat EGD showed adherent clot to one of the jejunal ulcers-this was treated with purastat Hb stable-has required a total of 3 units so far-last transfused on 4/1 Clear liquids restarted today Remains on PPI/Carafate GI following Remains at risk of rebleeding is still on Brilinta-watch closely. Follow CBC  Recent STEMI with PCI to LAD on 3/14 (3 overlapping stents) Reevaluated by Dr. Jacinto Halim on 3/31-no longer on aspirin-continue Brilinta.   Discussed with Dr. Jacinto Halim on 4/2-given recurrent bleeding-and high risk for ongoing rebleed-advises that we stop Brilinta and start Plavix.    HTN BP soft-but only creeping up. Hold metoprolol-reassess 4/2.  HLD Continue statin  Mood disorder Continue sertraline.  BMI: Estimated body mass index is 24.03 kg/m as calculated from the following:   Height as of this encounter: 5\' 4"  (1.626 m).   Weight as of this encounter: 63.5 kg.   Code status:   Code Status: Full Code   DVT Prophylaxis: SCDs Start: 12/07/23 0625   Family Communication: None at bedside   Disposition Plan: Status is: Inpatient Remains inpatient appropriate because: Severity of illness   Planned Discharge Destination:Home   Diet: Diet Order             Diet clear liquid Room service appropriate? Yes; Fluid consistency:  Thin  Diet effective now                     Antimicrobial agents: Anti-infectives (From admission, onward)    None        MEDICATIONS: Scheduled Meds:  sodium chloride   Intravenous Once   atorvastatin  80 mg Oral Daily   feeding supplement  1 Container Oral TID BM   pantoprazole (PROTONIX) IV  40 mg Intravenous Q12H   sertraline  100 mg Oral Daily   sucralfate  1 g Oral Q6H   ticagrelor  90 mg Oral BID   Continuous Infusions:  sodium chloride 75 mL/hr at 12/11/23 0307    PRN Meds:.acetaminophen, ipratropium-albuterol, ondansetron (ZOFRAN) IV   I have personally reviewed following labs and imaging studies  LABORATORY DATA: CBC: Recent Labs  Lab 12/09/23 0419 12/10/23 0432 12/10/23 1617 12/11/23 0435 12/11/23 0732  WBC 6.9 6.6 7.6 7.2 6.9  HGB 9.2* 9.5* 8.1* 8.5* 7.8*  HCT 27.5* 28.1* 24.6* 24.9* 23.1*  MCV 92.3 92.7 93.9 91.2 91.7  PLT 470* 482* 461* 392 352    Basic Metabolic Panel: Recent Labs  Lab 12/06/23 1759 12/06/23 1851 12/11/23 0732  NA 140 143 140  K 3.6 3.7 4.0  CL 106 106 111  CO2 25  --  20*  GLUCOSE 111* 107* 109*  BUN 11 12 10   CREATININE 0.55 0.50 0.51  CALCIUM 8.8*  --  7.8*    GFR: Estimated Creatinine Clearance: 68.6 mL/min (by C-G formula based on SCr of 0.51 mg/dL).  Liver Function Tests: Recent Labs  Lab 12/07/23 0639  AST 17  ALT 12  ALKPHOS 46  BILITOT 0.6  PROT 6.1*  ALBUMIN 3.4*   Recent Labs  Lab 12/06/23 1837  LIPASE 19   No results for input(s): "AMMONIA" in the last 168 hours.  Coagulation Profile: Recent Labs  Lab 12/06/23 1837  INR 1.1    Cardiac Enzymes: No results for input(s): "CKTOTAL", "CKMB", "CKMBINDEX", "TROPONINI" in the last 168 hours.  BNP (last 3 results) No results for input(s): "PROBNP" in the last 8760 hours.  Lipid Profile: No results for input(s): "CHOL", "HDL", "LDLCALC", "TRIG", "CHOLHDL", "LDLDIRECT" in the last 72 hours.  Thyroid Function Tests: No results for input(s): "TSH", "T4TOTAL", "FREET4", "T3FREE", "THYROIDAB" in the last 72 hours.  Anemia Panel: No results for input(s): "VITAMINB12", "FOLATE", "FERRITIN", "TIBC", "IRON", "RETICCTPCT" in the last 72 hours.  Urine analysis:    Component Value Date/Time   COLORURINE YELLOW 12/07/2023 0639   APPEARANCEUR HAZY (A) 12/07/2023 0639   LABSPEC 1.029 12/07/2023 0639   PHURINE 5.0 12/07/2023 0639   GLUCOSEU NEGATIVE 12/07/2023 0639   HGBUR NEGATIVE 12/07/2023 0639   BILIRUBINUR NEGATIVE  12/07/2023 0639   KETONESUR 20 (A) 12/07/2023 0639   PROTEINUR 30 (A) 12/07/2023 0639   UROBILINOGEN 1.0 04/07/2009 2004   NITRITE NEGATIVE 12/07/2023 0639   LEUKOCYTESUR NEGATIVE 12/07/2023 0639    Sepsis Labs: Lactic Acid, Venous    Component Value Date/Time   LATICACIDVEN 1.1 12/06/2023 1846    MICROBIOLOGY: No results found for this or any previous visit (from the past 240 hours).  RADIOLOGY STUDIES/RESULTS: No results found.   LOS: 4 days   Jeoffrey Massed, MD  [facility]    To contact the attending provider between 7A-7P or the covering provider during after hours 7P-7A, please log into the web site [email or web address] and access using universal  password for that web site. If you do not have  the password, please call the hospital operator.  12/11/2023, 11:36 AM

## 2023-12-11 NOTE — Anesthesia Postprocedure Evaluation (Signed)
 Anesthesia Post Note  Patient: Vanessa Wells  Procedure(s) Performed: EGD (ESOPHAGOGASTRODUODENOSCOPY) CONTROL OF HEMORRHAGE, GI TRACT, ENDOSCOPIC, BY CLIPPING OR OVERSEWING     Patient location during evaluation: PACU Anesthesia Type: General Level of consciousness: sedated and patient cooperative Pain management: pain level controlled Vital Signs Assessment: post-procedure vital signs reviewed and stable Respiratory status: spontaneous breathing Cardiovascular status: stable Anesthetic complications: no   No notable events documented.  Last Vitals:  Vitals:   12/11/23 0400 12/11/23 0800  BP: 100/65 95/61  Pulse: 68 76  Resp: 17 18  Temp: 37.1 C 36.9 C  SpO2: 100% 100%    Last Pain:  Vitals:   12/11/23 0400  TempSrc: Oral  PainSc:                  Lewie Loron

## 2023-12-11 NOTE — Progress Notes (Signed)
Hgb is 6.8. Plan to transfuse 1 unit RBC.

## 2023-12-11 NOTE — Progress Notes (Signed)
 Patient Name: Vanessa Wells Date of Encounter: 12/11/2023 [facility] Cardiologist: None Vanessa Decamp, MD, [facility] 12/06/2023 .admit Length of stay: 4  Interval Summary  .    Vanessa Wells is a 58 y.o. African-American female patient with acute anterior STEMI on 11/22/2023 SP proximal LAD and mid LAD 2 overlapping stents performed under ultrasound guidance, presenting to the hospital with ongoing abdominal discomfort and found to have peptic ulcer disease.  Prior history significant for gastric bypass surgery with 200 pound weight loss, hypertension, hypercholesterolemia, resolved diabetes.  I had seen her 2 days ago and signed off on her after changing her dual antiplatelet therapy from aspirin and Brilinta to plain Brilinta in view of massive GI bleed.  Unfortunately patient had Recurrent GI bleed on 12/10/2023 needing EGD and found to have very large ulcer, with high risk of bleeding.  Needed blood transfusion as well.  I was asked to comment on dual antiplatelet therapy or anticoagulant use.  Patient fortunately has been doing well and does not have any chest pain.  She is presently asymptomatic without dyspnea, chest pain, PND or orthopnea or palpitations.  Physical Exam    Vitals:   12/11/23 0000 12/11/23 0045 12/11/23 0400 12/11/23 0800  BP: 109/70 105/81 100/65 95/61  Pulse: 66 68 68 76  Resp: 16 17 17 18   Temp: 98.4 F (36.9 C) 98.9 F (37.2 C) 98.7 F (37.1 C) 98.4 F (36.9 C)  TempSrc: Oral Oral Oral   SpO2: 100% 100% 100% 100%  Weight:      Height:       Physical Exam Neck:     Vascular: No carotid bruit or JVD.  Cardiovascular:     Rate and Rhythm: Normal rate and regular rhythm.     Pulses: Intact distal pulses.     Heart sounds: Normal heart sounds. No murmur heard.    No gallop.  Pulmonary:     Effort: Pulmonary effort is normal.     Breath sounds: Normal breath sounds.  Abdominal:     General: Bowel sounds are normal.     Palpations: Abdomen is  soft.     Tenderness: There is abdominal tenderness in the epigastric area.  Musculoskeletal:     Right lower leg: No edema.     Left lower leg: No edema.        12/10/2023    5:25 PM 12/06/2023    5:48 PM 11/20/2023    3:20 PM  Last 3 Weights  Weight (lbs) 139 lb 15.9 oz 140 lb 159 lb  Weight (kg) 63.5 kg 63.504 kg 72.122 kg      Labs   Lab Results  Component Value Date   NA 140 12/11/2023   K 4.0 12/11/2023   CO2 20 (L) 12/11/2023   GLUCOSE 109 (H) 12/11/2023   BUN 10 12/11/2023   CREATININE 0.51 12/11/2023   CALCIUM 7.8 (L) 12/11/2023   GFRNONAA >60 12/11/2023       Latest Ref Rng & Units 12/11/2023    7:32 AM 12/06/2023    6:51 PM 12/06/2023    5:59 PM  BMP  Glucose 70 - 99 mg/dL 324  401  027   BUN 6 - 20 mg/dL 10  12  11    Creatinine 0.44 - 1.00 mg/dL 2.53  6.64  4.03   Sodium 135 - 145 mmol/L 140  143  140   Potassium 3.5 - 5.1 mmol/L 4.0  3.7  3.6   Chloride 98 -  111 mmol/L 111  106  106   CO2 22 - 32 mmol/L 20   25   Calcium 8.9 - 10.3 mg/dL 7.8   8.8        Latest Ref Rng & Units 12/11/2023    7:32 AM 12/11/2023    4:35 AM 12/10/2023    4:17 PM  CBC  WBC 4.0 - 10.5 K/uL 6.9  7.2  7.6   Hemoglobin 12.0 - 15.0 g/dL 7.8  8.5  8.1   Hematocrit 36.0 - 46.0 % 23.1  24.9  24.6   Platelets 150 - 400 K/uL 352  392  461    Intake/Output Summary (Last 24 hours) at 12/11/2023 1416 Last data filed at 12/11/2023 0600 Gross per 24 hour  Intake 1805.29 ml  Output --  Net 1805.29 ml    Net IO Since Admission: 1,703.29 mL [12/11/23 1416]   Tele/EKG/Cardiac studies    Telemetry 12/11/23 NSR.   Personally Reviewed Coronary angiogram for acute anterior STEMI at [facility] on 11/22/2023:   Ost LAD lesion is 70% stenosed, reduced to 0%.  SP 4.0 x 18 mm Xience DES.   Mid LAD lesion is 100% stenosed, reduced to 0%.  SP 2.5 x 33 and a 3.0 x 23 mm overlapping stents. Done under US guidance.    Echocardiogram 11/23/2023: There is mild concentric left ventricular  hypertrophy.  The left ventricular cavity is small.  Global LV systolic function is preserved  Severe apical and mild mid anterior and anteroseptal hypokinesis.  LV ejection fraction = 55-60%. Left ventricular filling pattern is indeterminate.  The right ventricle is normal in size and function.  There is mild to moderate tricuspid regurgitation.  Mild to moderate pulmonary hypertension.     Radiology    Current Meds:     Current Facility-Administered Medications:    0.9 %  sodium chloride infusion (Manually program via Guardrails IV Fluids), , Intravenous, Once, Jeani Hawking, MD, Held at 12/07/23 0104   0.9 %  sodium chloride infusion, , Intravenous, Continuous, Ghimire, Werner Lean, MD, Last Rate: 75 mL/hr at 12/11/23 0307, New Bag at 12/11/23 0307   acetaminophen (TYLENOL) tablet 650 mg, 650 mg, Oral, Q6H PRN, Jeani Hawking, MD   atorvastatin (LIPITOR) tablet 80 mg, 80 mg, Oral, Daily, Jeani Hawking, MD, 80 mg at 12/11/23 0845   clopidogrel (PLAVIX) tablet 75 mg, 75 mg, Oral, Daily, Ghimire, Werner Lean, MD   feeding supplement (BOOST / RESOURCE BREEZE) liquid 1 Container, 1 Container, Oral, TID BM, Ghimire, Werner Lean, MD, 1 Container at 12/09/23 2127   ipratropium-albuterol (DUONEB) 0.5-2.5 (3) MG/3ML nebulizer solution 3 mL, 3 mL, Nebulization, Q6H PRN, Jeani Hawking, MD   ondansetron [facility]) injection 4 mg, 4 mg, Intravenous, Q6H PRN, Jeani Hawking, MD   pantoprazole (PROTONIX) injection 40 mg, 40 mg, Intravenous, Q12H, Jeani Hawking, MD, 40 mg at 12/11/23 0846   sertraline (ZOLOFT) tablet 100 mg, 100 mg, Oral, Daily, Jeani Hawking, MD, 100 mg at 12/11/23 0845   sucralfate (CARAFATE) tablet 1 g, 1 g, Oral, Q6H, Armbruster, Willaim Rayas, MD, 1 g at 12/11/23 0845  Assessment & Plan .     1.  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  History of anterior wall myocardial infarction on 11/22/2023 SP proximal LAD and mid LAD stents under IVUS guidance. 3.  Hypertension 4.   Hypercholesterolemia  Recommendation: Patient at extreme risk of GI bleed and has a very large ulcer with clot and blood are not, discussed with  Dr. Adela Lank and also with hospitalist team, patient received blood transfusions.  At this point her risk of bleeding complications is much higher, would recommend discontinuing Brilinta and will switch to Plavix 75 mg daily empirically.  I had an extensive discussion with the patient and her family at the bedside regarding which that is to be done and risk of subacute stent thrombosis as stent is <30 days.  Aspirin has been discontinued 2 days ago.  Patient is aware of the risk and is willing.  Her antihypertensive medications have been on hold due to hypotension from hemorrhagic shock fortunately she remains hemodynamically stable.  Telemetry strips reviewed, normal sinus rhythm without ST-T changes.  With regard to hypercholesterolemia, presently tolerating high intensity statins, continue the same.  Will continue to follow her on sidelines, please do not hesitate to contact me at any point.  Patient prefers to switch her cardiac care from [facility] to our practice, I will make arrangements for her to follow-up with me in the outpatient basis.   For questions or updates, please contact [facility] Please consult [email or web address] for contact info under        Signed, Vanessa Decamp, MD, [facility] 12/11/2023, 2:16 PM [facility] [address] [place], [place] [address] Phone: [phone]. Fax:  [phone]  Cell: [phone]

## 2023-12-12 DIAGNOSIS — Z9884 Bariatric surgery status: Secondary | ICD-10-CM | POA: Diagnosis not present

## 2023-12-12 DIAGNOSIS — K922 Gastrointestinal hemorrhage, unspecified: Secondary | ICD-10-CM | POA: Diagnosis not present

## 2023-12-12 DIAGNOSIS — Z7902 Long term (current) use of antithrombotics/antiplatelets: Secondary | ICD-10-CM | POA: Diagnosis not present

## 2023-12-12 DIAGNOSIS — I10 Essential (primary) hypertension: Secondary | ICD-10-CM | POA: Diagnosis not present

## 2023-12-12 DIAGNOSIS — K284 Chronic or unspecified gastrojejunal ulcer with hemorrhage: Secondary | ICD-10-CM | POA: Diagnosis not present

## 2023-12-12 DIAGNOSIS — I2510 Atherosclerotic heart disease of native coronary artery without angina pectoris: Secondary | ICD-10-CM | POA: Diagnosis not present

## 2023-12-12 DIAGNOSIS — D62 Acute posthemorrhagic anemia: Secondary | ICD-10-CM | POA: Diagnosis not present

## 2023-12-12 LAB — CBC
HCT: 25 % — ABNORMAL LOW (ref 36.0–46.0)
Hemoglobin: 8.6 g/dL — ABNORMAL LOW (ref 12.0–15.0)
MCH: 30.4 pg (ref 26.0–34.0)
MCHC: 34.4 g/dL (ref 30.0–36.0)
MCV: 88.3 fL (ref 80.0–100.0)
Platelets: 325 10*3/uL (ref 150–400)
RBC: 2.83 MIL/uL — ABNORMAL LOW (ref 3.87–5.11)
RDW: 17.6 % — ABNORMAL HIGH (ref 11.5–15.5)
WBC: 7.1 10*3/uL (ref 4.0–10.5)
nRBC: 0 % (ref 0.0–0.2)

## 2023-12-12 LAB — BASIC METABOLIC PANEL WITH GFR
Anion gap: 12 (ref 5–15)
BUN: 7 mg/dL (ref 6–20)
CO2: 19 mmol/L — ABNORMAL LOW (ref 22–32)
Calcium: 8.2 mg/dL — ABNORMAL LOW (ref 8.9–10.3)
Chloride: 112 mmol/L — ABNORMAL HIGH (ref 98–111)
Creatinine, Ser: 0.52 mg/dL (ref 0.44–1.00)
GFR, Estimated: >60 mL/min (ref >60)
Glucose, Bld: 82 mg/dL (ref 70–99)
Potassium: 3.4 mmol/L — ABNORMAL LOW (ref 3.5–5.1)
Sodium: 143 mmol/L (ref 135–145)

## 2023-12-12 NOTE — Plan of Care (Signed)

## 2023-12-12 NOTE — Progress Notes (Signed)
 PROGRESS NOTE        PATIENT DETAILS Name: Vanessa Wells Age: 58 y.o. Sex: female Date of Birth: 1966-04-21 Admit Date: 12/06/2023 Admitting Physician Marinda Elk, MD [id number], Adrian Saran, MD  Brief Summary: Patient is a 58 y.o.  female with history of STEMI requiring PCI to LAD on 3/14-on DAPT with aspirin/Brilinta-presenting with upper GI bleed and acute blood loss anemia.  Significant events: 3/28>> admit to [facility] 3/30>> EGD with oozing jejunal ulcer 4/1>> diet advanced to full liquids-unfortunately had frank hematochezia with hypotension and syncope.  Repeat EGD ulceration ingestional with adherent clot-s/p treatment with purastat 4/2>> Hb dropped to 6.8-given 1 unit of PRBC-no overt bleeding.  Significant studies: None  Significant microbiology data: None  Procedures: 3/30>> EGD: Oozing jejunal ulcers with known bleeding visible vessel-treated with purastat 4/01>> EGD: 3 areas of ulceration-2 clean-based-largest with adherent clot treated with purastat  Consults: GI Cardiology  Subjective: Some drop in hemoglobin overnight requiring PRBC transfusion-but no hematochezia or melena or hematemesis.  Objective: Vitals: Blood pressure 108/65, pulse 73, temperature 98.8 F (37.1 C), temperature source Oral, resp. rate 13, height 5\' 4"  (1.626 m), weight 63.5 kg, SpO2 100%.   Exam: Gen Exam:Alert awake-not in any distress HEENT:atraumatic, normocephalic Chest: B/L clear to auscultation anteriorly CVS:S1S2 regular Abdomen:soft non tender, non distended Extremities:no edema Neurology: Non focal Skin: no rash  Pertinent Labs/Radiology:    Latest Ref Rng & Units 12/12/2023    8:33 AM 12/11/2023    9:22 PM 12/11/2023    2:34 PM  CBC  WBC 4.0 - 10.5 K/uL 7.1  8.9  9.6   Hemoglobin 12.0 - 15.0 g/dL 8.6  6.8  7.5   Hematocrit 36.0 - 46.0 % 25.0  20.0  22.3   Platelets 150 - 400 K/uL 325  333  347     Lab Results  Component Value Date   NA  143 12/12/2023   K 3.4 (L) 12/12/2023   CL 112 (H) 12/12/2023   CO2 19 (L) 12/12/2023      Assessment/Plan: Upper GI bleed with acute blood loss anemia Secondary to bleeding jejunal ulcers in the setting of DAPT use for recent STEMI Rebled on 4/1 requiring repeat EGD Has required a total of 4 units of PRBC transfusion so far-last transfused 1/2 Some drop in hemoglobin overnight but no overt GI bleeding-discussed with GI MD Dr. Samuel Bouche supportive care-okay to resume clear liquids. Continue to monitor closely-remains at risk of rebleeding.    Recent STEMI with PCI to LAD on 3/14 (3 overlapping stents) Evaluated by Dr. Jacinto Halim on 3/31-aspirin discontinued-Brilinta continued-had recurrent GI bleeding on 4/1-subsequently reevaluated by Dr. Jacinto Halim on 4/2-switched from [place] to Plavix.   Thankfully no anginal symptoms.   HTN BP soft Metoprolol being held.  HLD Continue statin  Mood disorder Continue sertraline.  BMI: Estimated body mass index is 24.03 kg/m as calculated from the following:   Height as of this encounter: 5\' 4"  (1.626 m).   Weight as of this encounter: 63.5 kg.   Code status:   Code Status: Full Code   DVT Prophylaxis: SCDs Start: 12/07/23 3086   Family Communication: Daughter at bedside.   Disposition Plan: Status is: Inpatient Remains inpatient appropriate because: Severity of illness   Planned Discharge Destination:Home   Diet: Diet Order  Diet clear liquid Fluid consistency: Thin  Diet effective now                     Antimicrobial agents: Anti-infectives (From admission, onward)    None        MEDICATIONS: Scheduled Meds:  sodium chloride   Intravenous Once   atorvastatin  80 mg Oral Daily   clopidogrel  75 mg Oral Daily   feeding supplement  1 Container Oral TID BM   pantoprazole (PROTONIX) IV  40 mg Intravenous Q12H   sertraline  100 mg Oral Daily   sucralfate  1 g Oral Q6H   Continuous  Infusions:   PRN Meds:.acetaminophen, ipratropium-albuterol, ondansetron (ZOFRAN) IV   I have personally reviewed following labs and imaging studies  LABORATORY DATA: CBC: Recent Labs  Lab 12/11/23 0435 12/11/23 0732 12/11/23 1434 12/11/23 2122 12/12/23 0833  WBC 7.2 6.9 9.6 8.9 7.1  HGB 8.5* 7.8* 7.5* 6.8* 8.6*  HCT 24.9* 23.1* 22.3* 20.0* 25.0*  MCV 91.2 91.7 91.8 92.6 88.3  PLT 392 352 347 333 325    Basic Metabolic Panel: Recent Labs  Lab 12/06/23 1759 12/06/23 1851 12/11/23 0732 12/12/23 0833  NA 140 143 140 143  K 3.6 3.7 4.0 3.4*  CL 106 106 111 112*  CO2 25  --  20* 19*  GLUCOSE 111* 107* 109* 82  BUN 11 12 10 7   CREATININE 0.55 0.50 0.51 0.52  CALCIUM 8.8*  --  7.8* 8.2*    GFR: Estimated Creatinine Clearance: 68.6 mL/min (by C-G formula based on SCr of 0.52 mg/dL).  Liver Function Tests: Recent Labs  Lab 12/07/23 0639  AST 17  ALT 12  ALKPHOS 46  BILITOT 0.6  PROT 6.1*  ALBUMIN 3.4*   Recent Labs  Lab 12/06/23 1837  LIPASE 19   No results for input(s): "AMMONIA" in the last 168 hours.  Coagulation Profile: Recent Labs  Lab 12/06/23 1837  INR 1.1    Cardiac Enzymes: No results for input(s): "CKTOTAL", "CKMB", "CKMBINDEX", "TROPONINI" in the last 168 hours.  BNP (last 3 results) No results for input(s): "PROBNP" in the last 8760 hours.  Lipid Profile: No results for input(s): "CHOL", "HDL", "LDLCALC", "TRIG", "CHOLHDL", "LDLDIRECT" in the last 72 hours.  Thyroid Function Tests: No results for input(s): "TSH", "T4TOTAL", "FREET4", "T3FREE", "THYROIDAB" in the last 72 hours.  Anemia Panel: No results for input(s): "VITAMINB12", "FOLATE", "FERRITIN", "TIBC", "IRON", "RETICCTPCT" in the last 72 hours.  Urine analysis:    Component Value Date/Time   COLORURINE YELLOW 12/07/2023 0639   APPEARANCEUR HAZY (A) 12/07/2023 0639   LABSPEC 1.029 12/07/2023 0639   PHURINE 5.0 12/07/2023 0639   GLUCOSEU NEGATIVE 12/07/2023 0639    HGBUR NEGATIVE 12/07/2023 0639   BILIRUBINUR NEGATIVE 12/07/2023 0639   KETONESUR 20 (A) 12/07/2023 0639   PROTEINUR 30 (A) 12/07/2023 0639   UROBILINOGEN 1.0 04/07/2009 2004   NITRITE NEGATIVE 12/07/2023 0639   LEUKOCYTESUR NEGATIVE 12/07/2023 0639    Sepsis Labs: Lactic Acid, Venous    Component Value Date/Time   LATICACIDVEN 1.1 12/06/2023 1846    MICROBIOLOGY: No results found for this or any previous visit (from the past 240 hours).  RADIOLOGY STUDIES/RESULTS: No results found.   LOS: 5 days   Jeoffrey Massed, MD  [facility]    To contact the attending provider between 7A-7P or the covering provider during after hours 7P-7A, please log into the web site [email or web address] and access using universal [place] password for that  web site. If you do not have the password, please call the hospital operator.  12/12/2023, 1:22 PM

## 2023-12-12 NOTE — Plan of Care (Signed)

## 2023-12-12 NOTE — Progress Notes (Signed)
 Progress Note   Subjective  Patient had a drift of Hgb to 6s overnight and then got transfused 1 unit, Hgb up to 8s today. She denies any overt blood loss. No pain.     Objective   Vital signs in last 24 hours: Temp:  [98.3 F (36.8 C)-99.4 F (37.4 C)] 98.8 F (37.1 C) (04/03 0800) Pulse Rate:  [65-92] 79 (04/03 0800) Resp:  [14-20] 19 (04/03 0800) BP: (90-104)/(52-68) 102/68 (04/03 0800) SpO2:  [98 %-100 %] 100 % (04/03 0800) Last BM Date : 12/11/23 General:    AA female in NAD Neurologic:  Alert and oriented,  grossly normal neurologically. Psych:  Cooperative. Normal mood and affect.  Intake/Output from previous day: 04/02 0701 - 04/03 0700 In: 2026.6 [I.V.:1711.6; Blood:315] Out: 850 [Urine:850] Intake/Output this shift: No intake/output data recorded.  Lab Results: Recent Labs    12/11/23 1434 12/11/23 2122 12/12/23 0833  WBC 9.6 8.9 7.1  HGB 7.5* 6.8* 8.6*  HCT 22.3* 20.0* 25.0*  PLT 347 333 325   BMET Recent Labs    12/11/23 0732 12/12/23 0833  NA 140 143  K 4.0 3.4*  CL 111 112*  CO2 20* 19*  GLUCOSE 109* 82  BUN 10 7  CREATININE 0.51 0.52  CALCIUM 7.8* 8.2*   LFT No results for input(s): "PROT", "ALBUMIN", "AST", "ALT", "ALKPHOS", "BILITOT", "BILIDIR", "IBILI" in the last 72 hours. PT/INR No results for input(s): "LABPROT", "INR" in the last 72 hours.  Studies/Results: No results found.     Assessment / Plan:    58 y/o female here with the following:   Marginal ulcer causing recurrent GI bleeding Post hemorrhagic anemia Antiplatelet use (Brilinta) for CAD - recent stenting to LAD  History of Roux-en-Y Gastric bypass   Marginal ulcer treated endoscopically per Dr. Elnoria Howard with Purastat on 3/30. Occurred in the setting of Brilinta and aspirin. The aspirin was stopped by cardiology. She unfortunately had significant rebleeding on 4/1. Emergent EGD evening of 4/1 - large marginal ulcers x 3. 2 were clean based, one with large  adherent clot, removed to base of the ulcer bed where there was a flat adherent clot that could not be flushed off. We did not have access to a blood transfusion during time of the procedure (due to antibodies RBC transfusion came from [place], 2 hour delay). Given she had stopped bleeding at the time of the exam, I did not remove the clot. This area is just distal to surgical anastomosis on the jejunal limb side, not easily amenable to clipping, I thought would cause trauma to the area. Treated again with Purastat.   She is feeling well and no overt bleeding. BUN normal. Hgb drifted overnight, given 1 unit PRBC but responded better than would be expected. Suspect re-equilibration. When she bleeds she has had significant output per rectum quickly due to her gastric bypass.   Discussed with Dr. Jacinto Halim of cardiology - they think perhaps Plavix would have a lower bleeding risk than Brilinta and so have switched Brilinta to Plavix. Cannot stop antiplatelet therapy due to recent stenting of CAD.   For now, continue IV protonix BID and carafate and monitor, minimize blood draws. Repeat Hgb in the AM unless she has bleeding symptoms. Would not advance diet today.      PLAN: - clear liquid diet today - continue IV protonix 40mg  BID while inpatient - continue carafate q 6 hours - avoid all NSAIDs - monitor for rebleeding.  Call with questions. Will follow.  Harlin Rain, MD [facility]

## 2023-12-13 DIAGNOSIS — K289 Gastrojejunal ulcer, unspecified as acute or chronic, without hemorrhage or perforation: Secondary | ICD-10-CM | POA: Diagnosis not present

## 2023-12-13 DIAGNOSIS — D62 Acute posthemorrhagic anemia: Secondary | ICD-10-CM | POA: Diagnosis not present

## 2023-12-13 DIAGNOSIS — K284 Chronic or unspecified gastrojejunal ulcer with hemorrhage: Secondary | ICD-10-CM | POA: Diagnosis not present

## 2023-12-13 DIAGNOSIS — Z7902 Long term (current) use of antithrombotics/antiplatelets: Secondary | ICD-10-CM | POA: Diagnosis not present

## 2023-12-13 DIAGNOSIS — Z9884 Bariatric surgery status: Secondary | ICD-10-CM | POA: Diagnosis not present

## 2023-12-13 LAB — CBC
HCT: 26 % — ABNORMAL LOW (ref 36.0–46.0)
Hemoglobin: 8.8 g/dL — ABNORMAL LOW (ref 12.0–15.0)
MCH: 30.6 pg (ref 26.0–34.0)
MCHC: 33.8 g/dL (ref 30.0–36.0)
MCV: 90.3 fL (ref 80.0–100.0)
Platelets: 339 10*3/uL (ref 150–400)
RBC: 2.88 MIL/uL — ABNORMAL LOW (ref 3.87–5.11)
RDW: 17.4 % — ABNORMAL HIGH (ref 11.5–15.5)
WBC: 8.4 10*3/uL (ref 4.0–10.5)
nRBC: 0 % (ref 0.0–0.2)

## 2023-12-13 LAB — BASIC METABOLIC PANEL WITH GFR
Anion gap: 8 (ref 5–15)
BUN: 6 mg/dL (ref 6–20)
CO2: 26 mmol/L (ref 22–32)
Calcium: 8.2 mg/dL — ABNORMAL LOW (ref 8.9–10.3)
Chloride: 110 mmol/L (ref 98–111)
Creatinine, Ser: 0.56 mg/dL (ref 0.44–1.00)
GFR, Estimated: >60 mL/min (ref >60)
Glucose, Bld: 88 mg/dL (ref 70–99)
Potassium: 3.7 mmol/L (ref 3.5–5.1)
Sodium: 144 mmol/L (ref 135–145)

## 2023-12-13 NOTE — Plan of Care (Signed)

## 2023-12-13 NOTE — Progress Notes (Signed)
 PROGRESS NOTE        PATIENT DETAILS Name: Vanessa Wells Age: 58 y.o. Sex: female Date of Birth: [birth year] Admit Date: 12/06/2023 Admitting Physician Marinda Elk, MD QMV:[id number], Adrian Saran, MD  Brief Summary: Patient is a 58 y.o.  female with history of STEMI requiring PCI to LAD on 3/14-on DAPT with aspirin/Brilinta-presenting with upper GI bleed and acute blood loss anemia.  Significant events: 3/28>> admit to [facility] 3/30>> EGD with oozing jejunal ulcer 4/1>> diet advanced to full liquids-unfortunately had frank hematochezia with hypotension and syncope.  Repeat EGD ulceration ingestional with adherent clot-s/p treatment with purastat 4/2>> Hb dropped to 6.8-given 1 unit of PRBC-no overt bleeding.  Significant studies: None  Significant microbiology data: None  Procedures: 3/30>> EGD: Oozing jejunal ulcers with known bleeding visible vessel-treated with purastat 4/01>> EGD: 3 areas of ulceration-2 clean-based-largest with adherent clot treated with purastat  Consults: GI Cardiology  Subjective: BM overnight-lying comfortably in bed.  Objective: Vitals: Blood pressure 105/65, pulse 69, temperature 98.1 F (36.7 C), temperature source Oral, resp. rate 18, height 5\' 4"  (1.626 m), weight 63.5 kg, SpO2 99%.   Exam: Gen Exam:Alert awake-not in any distress HEENT:atraumatic, normocephalic Chest: B/L clear to auscultation anteriorly CVS:S1S2 regular Abdomen:soft non tender, non distended Extremities:no edema Neurology: Non focal Skin: no rash  Pertinent Labs/Radiology:    Latest Ref Rng & Units 12/13/2023    5:31 AM 12/12/2023    8:33 AM 12/11/2023    9:22 PM  CBC  WBC 4.0 - 10.5 K/uL 8.4  7.1  8.9   Hemoglobin 12.0 - 15.0 g/dL 8.8  8.6  6.8   Hematocrit 36.0 - 46.0 % 26.0  25.0  20.0   Platelets 150 - 400 K/uL 339  325  333     Lab Results  Component Value Date   NA 144 12/13/2023   K 3.7 12/13/2023   CL 110 12/13/2023   CO2 26  12/13/2023      Assessment/Plan: Upper GI bleed with acute blood loss anemia Secondary to bleeding jejunal ulcers in the setting of DAPT use for recent STEMI Rebled on 4/1 requiring repeat EGD Has required a total of 4 units of PRBC transfusion so far-last transfused 1/2 Thankfully stable overnight-Hb stable-no medic easier/melena Remains on PPI Remains on clear liquid diet GI following-await further recommendations.    Recent STEMI with PCI to LAD on 3/14 (3 overlapping stents) Evaluated by Dr. Jacinto Halim on 3/31-aspirin discontinued-Brilinta continued-had recurrent GI bleeding on 4/1-subsequently reevaluated by Dr. Jacinto Halim on 4/2-switched from [place] to Plavix.   Thankfully no anginal symptoms.  HTN BP soft Metoprolol being held.  HLD Continue statin  Mood disorder Continue sertraline.  BMI: Estimated body mass index is 24.03 kg/m as calculated from the following:   Height as of this encounter: 5\' 4"  (1.626 m).   Weight as of this encounter: 63.5 kg.   Code status:   Code Status: Full Code   DVT Prophylaxis: SCDs Start: 12/07/23 6295   Family Communication: Sister on the phone when I was rounding this morning.   Disposition Plan: Status is: Inpatient Remains inpatient appropriate because: Severity of illness   Planned Discharge Destination:Home   Diet: Diet Order             Diet clear liquid Fluid consistency: Thin  Diet effective now  Antimicrobial agents: Anti-infectives (From admission, onward)    None        MEDICATIONS: Scheduled Meds:  sodium chloride   Intravenous Once   atorvastatin  80 mg Oral Daily   clopidogrel  75 mg Oral Daily   feeding supplement  1 Container Oral TID BM   pantoprazole (PROTONIX) IV  40 mg Intravenous Q12H   sertraline  100 mg Oral Daily   sucralfate  1 g Oral Q6H   Continuous Infusions:   PRN Meds:.acetaminophen, ipratropium-albuterol, ondansetron (ZOFRAN) IV   I have personally  reviewed following labs and imaging studies  LABORATORY DATA: CBC: Recent Labs  Lab 12/11/23 0732 12/11/23 1434 12/11/23 2122 12/12/23 0833 12/13/23 0531  WBC 6.9 9.6 8.9 7.1 8.4  HGB 7.8* 7.5* 6.8* 8.6* 8.8*  HCT 23.1* 22.3* 20.0* 25.0* 26.0*  MCV 91.7 91.8 92.6 88.3 90.3  PLT 352 347 333 325 339    Basic Metabolic Panel: Recent Labs  Lab 12/06/23 1759 12/06/23 1851 12/11/23 0732 12/12/23 0833 12/13/23 0531  NA 140 143 140 143 144  K 3.6 3.7 4.0 3.4* 3.7  CL 106 106 111 112* 110  CO2 25  --  20* 19* 26  GLUCOSE 111* 107* 109* 82 88  BUN 11 12 10 7 6   CREATININE 0.55 0.50 0.51 0.52 0.56  CALCIUM 8.8*  --  7.8* 8.2* 8.2*    GFR: Estimated Creatinine Clearance: 68.6 mL/min (by C-G formula based on SCr of 0.56 mg/dL).  Liver Function Tests: Recent Labs  Lab 12/07/23 0639  AST 17  ALT 12  ALKPHOS 46  BILITOT 0.6  PROT 6.1*  ALBUMIN 3.4*   Recent Labs  Lab 12/06/23 1837  LIPASE 19   No results for input(s): "AMMONIA" in the last 168 hours.  Coagulation Profile: Recent Labs  Lab 12/06/23 1837  INR 1.1    Cardiac Enzymes: No results for input(s): "CKTOTAL", "CKMB", "CKMBINDEX", "TROPONINI" in the last 168 hours.  BNP (last 3 results) No results for input(s): "PROBNP" in the last 8760 hours.  Lipid Profile: No results for input(s): "CHOL", "HDL", "LDLCALC", "TRIG", "CHOLHDL", "LDLDIRECT" in the last 72 hours.  Thyroid Function Tests: No results for input(s): "TSH", "T4TOTAL", "FREET4", "T3FREE", "THYROIDAB" in the last 72 hours.  Anemia Panel: No results for input(s): "VITAMINB12", "FOLATE", "FERRITIN", "TIBC", "IRON", "RETICCTPCT" in the last 72 hours.  Urine analysis:    Component Value Date/Time   COLORURINE YELLOW 12/07/2023 0639   APPEARANCEUR HAZY (A) 12/07/2023 0639   LABSPEC 1.029 12/07/2023 0639   PHURINE 5.0 12/07/2023 0639   GLUCOSEU NEGATIVE 12/07/2023 0639   HGBUR NEGATIVE 12/07/2023 0639   BILIRUBINUR NEGATIVE 12/07/2023  0639   KETONESUR 20 (A) 12/07/2023 0639   PROTEINUR 30 (A) 12/07/2023 0639   UROBILINOGEN 1.0 04/07/2009 2004   NITRITE NEGATIVE 12/07/2023 0639   LEUKOCYTESUR NEGATIVE 12/07/2023 0639    Sepsis Labs: Lactic Acid, Venous    Component Value Date/Time   LATICACIDVEN 1.1 12/06/2023 1846    MICROBIOLOGY: No results found for this or any previous visit (from the past 240 hours).  RADIOLOGY STUDIES/RESULTS: No results found.   LOS: 6 days   Jeoffrey Massed, MD  [facility]    To contact the attending provider between 7A-7P or the covering provider during after hours 7P-7A, please log into the web site [email or web address] and access using universal [place] password for that web site. If you do not have the password, please call the hospital operator.  12/13/2023, 10:54 AM

## 2023-12-13 NOTE — TOC Progression Note (Signed)
 Transition of Care [facility]) - Progression Note    Patient Details  Name: Vanessa Wells MRN: [id number] Date of Birth: 1966-01-28  [facility] CM/SW Contact  Gordy Clement, RN Phone Number: 12/13/2023, 3:54 PM  Clinical Narrative:     TOC following for any needs  No orders for PT/OT eval. Do not anticipate any TOC needs       Barriers to Discharge: Continued Medical Work up  Expected Discharge Plan and Services                                               Social Determinants of Health (SDOH) Interventions SDOH Screenings   Food Insecurity: No Food Insecurity (12/07/2023)  Housing: Low Risk  (12/07/2023)  Transportation Needs: No Transportation Needs (12/07/2023)  Utilities: Not At Risk (12/07/2023)  Tobacco Use: Low Risk  (12/10/2023)    Readmission Risk Interventions     No data to display

## 2023-12-13 NOTE — Plan of Care (Signed)

## 2023-12-13 NOTE — Progress Notes (Signed)
 Progress Note   Subjective  No bleeding, feels well. Tolerating clear liquids.   Objective   Vital signs in last 24 hours: Temp:  [98.1 F (36.7 C)-99 F (37.2 C)] 98.5 F (36.9 C) (04/04 1232) Pulse Rate:  [59-72] 65 (04/04 1232) Resp:  [13-23] 17 (04/04 1232) BP: (98-118)/(48-74) 110/60 (04/04 1232) SpO2:  [96 %-100 %] 99 % (04/04 1232) Last BM Date : 12/11/23 General:    AA female in NAD Neurologic:  Alert and oriented,  grossly normal neurologically. Psych:  Cooperative. Normal mood and affect.  Intake/Output from previous day: 04/03 0701 - 04/04 0700 In: -  Out: 300 [Urine:300] Intake/Output this shift: No intake/output data recorded.  Lab Results: Recent Labs    12/11/23 2122 12/12/23 0833 12/13/23 0531  WBC 8.9 7.1 8.4  HGB 6.8* 8.6* 8.8*  HCT 20.0* 25.0* 26.0*  PLT 333 325 339   BMET Recent Labs    12/11/23 0732 12/12/23 0833 12/13/23 0531  NA 140 143 144  K 4.0 3.4* 3.7  CL 111 112* 110  CO2 20* 19* 26  GLUCOSE 109* 82 88  BUN 10 7 6   CREATININE 0.51 0.52 0.56  CALCIUM 7.8* 8.2* 8.2*   LFT No results for input(s): "PROT", "ALBUMIN", "AST", "ALT", "ALKPHOS", "BILITOT", "BILIDIR", "IBILI" in the last 72 hours. PT/INR No results for input(s): "LABPROT", "INR" in the last 72 hours.  Studies/Results: No results found.     Assessment / Plan:    58 y/o female here with the following:   Marginal ulcer causing recurrent GI bleeding Post hemorrhagic anemia Antiplatelet use (Brilinta) for CAD - recent stenting to LAD  History of Roux-en-Y Gastric bypass   Marginal ulcer treated endoscopically per Dr. Elnoria Howard with Purastat on 3/30. Occurred in the setting of Brilinta and aspirin. The aspirin was stopped by cardiology. She unfortunately had significant rebleeding on 4/1. Emergent EGD evening of 4/1 - large marginal ulcers x 3. 2 were clean based, one with large adherent clot, removed to base of the ulcer bed where there was a flat adherent  clot that could not be flushed off. We did not have access to a blood transfusion during time of the procedure (due to antibodies RBC transfusion came from [place], 2 hour delay). Given she had stopped bleeding at the time of the exam, I did not remove the clot. This area is just distal to surgical anastomosis on the jejunal limb side, not easily amenable to clipping, I thought would cause trauma to the area. Treated again with Purastat.   She had a drop in Hgb 4/2 and transfused, Hgb 8.6 yesterday and 8.8 today, doing better. Cardiology has seen and switched Brilinta to Plavix in hopes of reducing her rebleeding risk. She is tolerating clear liquid diet. I think okay to advance to full liquid today and monitor again today.   Tonight will be 72 hours post endoscopic therapy, continue IV Protonix for now while inpatient. Will see how she does today, repeat Hgb in the AM. If okay tomorrow may consider discharge later tomorrow or Sunday depending on how comfortable she feels with this given she has a significant rebleed earlier this week. She remains at risk for rebleeding given size of ulcer and ongoing antiplatelet therapy but hopefully with more time with medical therapy ulcer will heal.    PLAN: - full liquid diet today - continue IV protonix 40mg  BID while inpatient - continue carafate q 6 hours - avoid all NSAIDs - monitor  for rebleeding, repeat Hgb in the AM  - discharge possible later tomorrow or Sunday pending her comfort level. Will need high dose protonix as outpatient.   Call with questions. Will check in her in the AM  Harlin Rain, MD [facility]

## 2023-12-14 DIAGNOSIS — Z7902 Long term (current) use of antithrombotics/antiplatelets: Secondary | ICD-10-CM | POA: Diagnosis not present

## 2023-12-14 DIAGNOSIS — K289 Gastrojejunal ulcer, unspecified as acute or chronic, without hemorrhage or perforation: Secondary | ICD-10-CM | POA: Diagnosis not present

## 2023-12-14 DIAGNOSIS — K284 Chronic or unspecified gastrojejunal ulcer with hemorrhage: Secondary | ICD-10-CM | POA: Diagnosis not present

## 2023-12-14 DIAGNOSIS — D62 Acute posthemorrhagic anemia: Secondary | ICD-10-CM | POA: Diagnosis not present

## 2023-12-14 DIAGNOSIS — Z9884 Bariatric surgery status: Secondary | ICD-10-CM | POA: Diagnosis not present

## 2023-12-14 LAB — TYPE AND SCREEN
ABO/RH(D): B POS
Antibody Screen: POSITIVE
Donor AG Type: NEGATIVE
Donor AG Type: NEGATIVE
Donor AG Type: NEGATIVE
Unit division: 0
Unit division: 0
Unit division: 0
Unit division: 0
Unit division: 0

## 2023-12-14 LAB — BPAM RBC
Blood Product Expiration Date: 202504142359
Blood Product Expiration Date: 202504172359
Blood Product Expiration Date: 202504172359
Blood Product Expiration Date: 202504182359
Blood Product Expiration Date: 202505022359
ISSUE DATE / TIME: 202504012046
ISSUE DATE / TIME: 202504030004
Unit Type and Rh: 5100
Unit Type and Rh: 5100
Unit Type and Rh: 5100
Unit Type and Rh: 7300
Unit Type and Rh: 7300

## 2023-12-14 LAB — CBC
HCT: 25.8 % — ABNORMAL LOW (ref 36.0–46.0)
Hemoglobin: 8.6 g/dL — ABNORMAL LOW (ref 12.0–15.0)
MCH: 30.6 pg (ref 26.0–34.0)
MCHC: 33.3 g/dL (ref 30.0–36.0)
MCV: 91.8 fL (ref 80.0–100.0)
Platelets: 350 10*3/uL (ref 150–400)
RBC: 2.81 MIL/uL — ABNORMAL LOW (ref 3.87–5.11)
RDW: 17.4 % — ABNORMAL HIGH (ref 11.5–15.5)
WBC: 7.1 10*3/uL (ref 4.0–10.5)
nRBC: 0 % (ref 0.0–0.2)

## 2023-12-14 LAB — BASIC METABOLIC PANEL WITH GFR
Anion gap: 11 (ref 5–15)
BUN: 6 mg/dL (ref 6–20)
CO2: 23 mmol/L (ref 22–32)
Calcium: 8.3 mg/dL — ABNORMAL LOW (ref 8.9–10.3)
Chloride: 110 mmol/L (ref 98–111)
Creatinine, Ser: 0.67 mg/dL (ref 0.44–1.00)
GFR, Estimated: >60 mL/min (ref >60)
Glucose, Bld: 85 mg/dL (ref 70–99)
Potassium: 3.2 mmol/L — ABNORMAL LOW (ref 3.5–5.1)
Sodium: 144 mmol/L (ref 135–145)

## 2023-12-14 MED ORDER — POTASSIUM CHLORIDE CRYS ER 20 MEQ PO TBCR
40.0000 meq | EXTENDED_RELEASE_TABLET | Freq: Once | ORAL | Status: AC
  Administered 2023-12-14: 40 meq via ORAL
  Filled 2023-12-14: qty 2

## 2023-12-14 NOTE — Progress Notes (Signed)
      Progress Note   Subjective  Doing well, no bleeding symptoms. She is anxious to go home.    Objective   Vital signs in last 24 hours: Temp:  [97.6 F (36.4 C)-98.6 F (37 C)] 98.5 F (36.9 C) (04/05 1255) Pulse Rate:  [64-84] 84 (04/05 1255) Resp:  [10-25] 18 (04/05 1255) BP: (105-137)/(61-111) 105/63 (04/05 1255) SpO2:  [97 %-100 %] 100 % (04/05 0827) Last BM Date : 12/10/23 General:    AA female in NAD Neurologic:  Alert and oriented,  grossly normal neurologically. Psych:  Cooperative. Normal mood and affect.  Intake/Output from previous day: 04/04 0701 - 04/05 0700 In: 480 [P.O.:480] Out: -  Intake/Output this shift: Total I/O In: 240 [P.O.:240] Out: -   Lab Results: Recent Labs    12/12/23 0833 12/13/23 0531 12/14/23 0542  WBC 7.1 8.4 7.1  HGB 8.6* 8.8* 8.6*  HCT 25.0* 26.0* 25.8*  PLT 325 339 350   BMET Recent Labs    12/12/23 0833 12/13/23 0531 12/14/23 0542  NA 143 144 144  K 3.4* 3.7 3.2*  CL 112* 110 110  CO2 19* 26 23  GLUCOSE 82 88 85  BUN 7 6 6   CREATININE 0.52 0.56 0.67  CALCIUM 8.2* 8.2* 8.3*   LFT No results for input(s): "PROT", "ALBUMIN", "AST", "ALT", "ALKPHOS", "BILITOT", "BILIDIR", "IBILI" in the last 72 hours. PT/INR No results for input(s): "LABPROT", "INR" in the last 72 hours.  Studies/Results: No results found.     Assessment / Plan:    58 y/o female here with the following:  Marginal ulcer causing recurrent GI bleeding Post hemorrhagic anemia Antiplatelet use (Brilinta) for CAD - recent stenting to LAD  History of Roux-en-Y Gastric bypass   See details of history in prior notes. Recurrent GI bleeding from large marginal ulcer in the setting of antiplatelet therapy which cannot be stopped due to recent coronary stenting. Now 4 days out from EGD. No recurrent bleeding. Brilinta switched to Plavix.  She is stable but she is not comfortable going home yet, she has not had solids yet.  Will advance to soft  diet, continue PPI and carafate. If stable tomorrow AM can be discharged then. I will follow her up in the office for repeat EGD in a few months, our staff can coordinate. In the interim continue BID protonix, carafate.    PLAN: - soft diet today - continue IV protonix 40mg  BID while inpatient then transition to oral protonix (in light of plavix use) 40mg  PO BID - she should crush tablets when taking this to help with absorption given gastric bypass history - continue carafate q 8 hours for another week - avoid all NSAIDs - discharge tomorrow if stable - our office will coordinate follow up as outpatient  Harlin Rain, MD [facility]astroenter[facility]

## 2023-12-14 NOTE — Progress Notes (Addendum)
 PROGRESS NOTE        PATIENT DETAILS Name: Vanessa Wells Age: 58 y.o. Sex: female Date of Birth: 1966-07-28 Admit Date: 12/06/2023 Admitting Physician Marinda Elk, MD WUJ:[id number], Adrian Saran, MD  Brief Summary: Patient is a 58 y.o.  female with history of STEMI requiring PCI to LAD on 3/14-on DAPT with aspirin/Brilinta-presenting with upper GI bleed and acute blood loss anemia.  Significant events: 3/28>> admit to [facility] 3/30>> EGD with oozing jejunal ulcer 4/1>> diet advanced to full liquids-unfortunately had frank hematochezia with hypotension and syncope.  Repeat EGD ulceration ingestional with adherent clot-s/p treatment with purastat 4/2>> Hb dropped to 6.8-given 1 unit of PRBC-no overt bleeding.  Significant studies: None  Significant microbiology data: None  Procedures: 3/30>> EGD: Oozing jejunal ulcers with known bleeding visible vessel-treated with purastat 4/01>> EGD: 3 areas of ulceration-2 clean-based-largest with adherent clot treated with purastat  Consults: GI Cardiology  Subjective: No hematochezia/melena overnight-no major issues.  Lying comfortably in bed.  Tolerating full liquids.  Objective: Vitals: Blood pressure (!) 137/111, pulse 69, temperature 98 F (36.7 C), temperature source Oral, resp. rate 11, height 5\' 4"  (1.626 m), weight 63.5 kg, SpO2 100%.   Exam: Awake/alert Nontender/nondistended abdomen No leg edema Nonfocal exam  Pertinent Labs/Radiology:    Latest Ref Rng & Units 12/14/2023    5:42 AM 12/13/2023    5:31 AM 12/12/2023    8:33 AM  CBC  WBC 4.0 - 10.5 K/uL 7.1  8.4  7.1   Hemoglobin 12.0 - 15.0 g/dL 8.6  8.8  8.6   Hematocrit 36.0 - 46.0 % 25.8  26.0  25.0   Platelets 150 - 400 K/uL 350  339  325     Lab Results  Component Value Date   NA 144 12/14/2023   K 3.2 (L) 12/14/2023   CL 110 12/14/2023   CO2 23 12/14/2023      Assessment/Plan: Upper GI bleed with acute blood loss anemia Secondary  to bleeding jejunal ulcers in the setting of DAPT use for recent STEMI Rebled on 4/1 requiring repeat EGD Has required a total of 4 units of PRBC transfusion so far-last transfused 1/2 Thankfully Hb stable for the past several days-tolerating full liquids-no melena or hematochezia overnight. Continue PPI/Carafate Await GI recommendations regarding potential discharge later today or tomorrow.    Recent STEMI with PCI to LAD on 3/14 (3 overlapping stents) Evaluated by Dr. Jacinto Halim on 3/31-aspirin discontinued-Brilinta initially continued-had recurrent GI bleeding on 4/1-subsequently reevaluated by Dr. Jacinto Halim on 4/2-switched from [place] to Plavix.   Thankfully no anginal symptoms.  HTN BP soft Metoprolol being held.  HLD Continue statin  Mood disorder Continue sertraline.  BMI: Estimated body mass index is 24.03 kg/m as calculated from the following:   Height as of this encounter: 5\' 4"  (1.626 m).   Weight as of this encounter: 63.5 kg.   Code status:   Code Status: Full Code   DVT Prophylaxis: SCDs Start: 12/07/23 0625   Family Communication: None at bedside   Disposition Plan: Status is: Inpatient Remains inpatient appropriate because: Severity of illness   Planned Discharge Destination:Home   Diet: Diet Order             Diet full liquid Room service appropriate? Yes; Fluid consistency: Thin  Diet effective now  Antimicrobial agents: Anti-infectives (From admission, onward)    None        MEDICATIONS: Scheduled Meds:  sodium chloride   Intravenous Once   atorvastatin  80 mg Oral Daily   clopidogrel  75 mg Oral Daily   feeding supplement  1 Container Oral TID BM   pantoprazole (PROTONIX) IV  40 mg Intravenous Q12H   sertraline  100 mg Oral Daily   sucralfate  1 g Oral Q6H   Continuous Infusions:   PRN Meds:.acetaminophen, ipratropium-albuterol, ondansetron (ZOFRAN) IV   I have personally reviewed following labs and  imaging studies  LABORATORY DATA: CBC: Recent Labs  Lab 12/11/23 1434 12/11/23 2122 12/12/23 0833 12/13/23 0531 12/14/23 0542  WBC 9.6 8.9 7.1 8.4 7.1  HGB 7.5* 6.8* 8.6* 8.8* 8.6*  HCT 22.3* 20.0* 25.0* 26.0* 25.8*  MCV 91.8 92.6 88.3 90.3 91.8  PLT 347 333 325 339 350    Basic Metabolic Panel: Recent Labs  Lab 12/11/23 0732 12/12/23 0833 12/13/23 0531 12/14/23 0542  NA 140 143 144 144  K 4.0 3.4* 3.7 3.2*  CL 111 112* 110 110  CO2 20* 19* 26 23  GLUCOSE 109* 82 88 85  BUN 10 7 6 6   CREATININE 0.51 0.52 0.56 0.67  CALCIUM 7.8* 8.2* 8.2* 8.3*    GFR: Estimated Creatinine Clearance: 68.6 mL/min (by C-G formula based on SCr of 0.67 mg/dL).  Liver Function Tests: No results for input(s): "AST", "ALT", "ALKPHOS", "BILITOT", "PROT", "ALBUMIN" in the last 168 hours.  No results for input(s): "LIPASE", "AMYLASE" in the last 168 hours.  No results for input(s): "AMMONIA" in the last 168 hours.  Coagulation Profile: No results for input(s): "INR", "PROTIME" in the last 168 hours.   Cardiac Enzymes: No results for input(s): "CKTOTAL", "CKMB", "CKMBINDEX", "TROPONINI" in the last 168 hours.  BNP (last 3 results) No results for input(s): "PROBNP" in the last 8760 hours.  Lipid Profile: No results for input(s): "CHOL", "HDL", "LDLCALC", "TRIG", "CHOLHDL", "LDLDIRECT" in the last 72 hours.  Thyroid Function Tests: No results for input(s): "TSH", "T4TOTAL", "FREET4", "T3FREE", "THYROIDAB" in the last 72 hours.  Anemia Panel: No results for input(s): "VITAMINB12", "FOLATE", "FERRITIN", "TIBC", "IRON", "RETICCTPCT" in the last 72 hours.  Urine analysis:    Component Value Date/Time   COLORURINE YELLOW 12/07/2023 0639   APPEARANCEUR HAZY (A) 12/07/2023 0639   LABSPEC 1.029 12/07/2023 0639   PHURINE 5.0 12/07/2023 0639   GLUCOSEU NEGATIVE 12/07/2023 0639   HGBUR NEGATIVE 12/07/2023 0639   BILIRUBINUR NEGATIVE 12/07/2023 0639   KETONESUR 20 (A) 12/07/2023 0639    PROTEINUR 30 (A) 12/07/2023 0639   UROBILINOGEN 1.0 04/07/2009 2004   NITRITE NEGATIVE 12/07/2023 0639   LEUKOCYTESUR NEGATIVE 12/07/2023 0639    Sepsis Labs: Lactic Acid, Venous    Component Value Date/Time   LATICACIDVEN 1.1 12/06/2023 1846    MICROBIOLOGY: No results found for this or any previous visit (from the past 240 hours).  RADIOLOGY STUDIES/RESULTS: No results found.   LOS: 7 days   Jeoffrey Massed, MD  [facility]    To contact the attending provider between 7A-7P or the covering provider during after hours 7P-7A, please log into the web site [email or web address] and access using universal [place] password for that web site. If you do not have the password, please call the hospital operator.  12/14/2023, 11:29 AM

## 2023-12-14 NOTE — Progress Notes (Signed)
 Notified that patient is passing blood and clots from rectum. Will make her NPO again, repeat CBC in am (sooner if needed based on clinical course).

## 2023-12-14 NOTE — Plan of Care (Signed)

## 2023-12-14 NOTE — Plan of Care (Signed)
  Problem: Health Behavior/Discharge Planning: Goal: Ability to manage health-related needs will improve Outcome: Progressing   Problem: Clinical Measurements: Goal: Will remain free from infection Outcome: Progressing   Problem: Activity: Goal: Risk for activity intolerance will decrease Outcome: Progressing   Problem: Coping: Goal: Level of anxiety will decrease Outcome: Progressing   

## 2023-12-15 DIAGNOSIS — K922 Gastrointestinal hemorrhage, unspecified: Secondary | ICD-10-CM | POA: Diagnosis not present

## 2023-12-15 LAB — CBC WITH DIFFERENTIAL/PLATELET
Abs Immature Granulocytes: 0.02 10*3/uL (ref 0.00–0.07)
Basophils Absolute: 0 10*3/uL (ref 0.0–0.1)
Basophils Relative: 0 %
Eosinophils Absolute: 0.4 10*3/uL (ref 0.0–0.5)
Eosinophils Relative: 5 %
HCT: 26.9 % — ABNORMAL LOW (ref 36.0–46.0)
Hemoglobin: 8.9 g/dL — ABNORMAL LOW (ref 12.0–15.0)
Immature Granulocytes: 0 %
Lymphocytes Relative: 25 %
Lymphs Abs: 1.7 10*3/uL (ref 0.7–4.0)
MCH: 30.9 pg (ref 26.0–34.0)
MCHC: 33.1 g/dL (ref 30.0–36.0)
MCV: 93.4 fL (ref 80.0–100.0)
Monocytes Absolute: 0.5 10*3/uL (ref 0.1–1.0)
Monocytes Relative: 8 %
Neutro Abs: 4 10*3/uL (ref 1.7–7.7)
Neutrophils Relative %: 62 %
Platelets: 374 10*3/uL (ref 150–400)
RBC: 2.88 MIL/uL — ABNORMAL LOW (ref 3.87–5.11)
RDW: 17.6 % — ABNORMAL HIGH (ref 11.5–15.5)
WBC: 6.6 10*3/uL (ref 4.0–10.5)
nRBC: 0 % (ref 0.0–0.2)

## 2023-12-15 LAB — BASIC METABOLIC PANEL WITH GFR
Anion gap: 6 (ref 5–15)
BUN: 7 mg/dL (ref 6–20)
CO2: 28 mmol/L (ref 22–32)
Calcium: 8.3 mg/dL — ABNORMAL LOW (ref 8.9–10.3)
Chloride: 109 mmol/L (ref 98–111)
Creatinine, Ser: 0.79 mg/dL (ref 0.44–1.00)
GFR, Estimated: >60 mL/min (ref >60)
Glucose, Bld: 84 mg/dL (ref 70–99)
Potassium: 3.5 mmol/L (ref 3.5–5.1)
Sodium: 143 mmol/L (ref 135–145)

## 2023-12-15 LAB — PHOSPHORUS: Phosphorus: 3.7 mg/dL (ref 2.5–4.6)

## 2023-12-15 LAB — MAGNESIUM: Magnesium: 1.9 mg/dL (ref 1.7–2.4)

## 2023-12-15 MED ORDER — POTASSIUM CHLORIDE CRYS ER 20 MEQ PO TBCR: 40.0000 meq | EXTENDED_RELEASE_TABLET | Freq: Two times a day (BID) | ORAL | Status: DC

## 2023-12-15 MED ORDER — POTASSIUM CHLORIDE CRYS ER 20 MEQ PO TBCR
40.0000 meq | EXTENDED_RELEASE_TABLET | Freq: Once | ORAL | Status: AC
Start: 2023-12-15 — End: 2023-12-15
  Administered 2023-12-15: 40 meq via ORAL
  Filled 2023-12-15: qty 2

## 2023-12-15 MED ORDER — METOPROLOL TARTRATE 25 MG PO TABS
25.0000 mg | ORAL_TABLET | Freq: Two times a day (BID) | ORAL | Status: DC
  Administered 2023-12-15 – 2023-12-16 (×2): 25 mg via ORAL
  Filled 2023-12-15 (×3): qty 1

## 2023-12-15 NOTE — Plan of Care (Signed)
  Problem: Clinical Measurements: Goal: Ability to maintain clinical measurements within normal limits will improve Outcome: Progressing Goal: Will remain free from infection Outcome: Progressing   Problem: Activity: Goal: Risk for activity intolerance will decrease Outcome: Progressing   Problem: Coping: Goal: Level of anxiety will decrease Outcome: Progressing   

## 2023-12-15 NOTE — Progress Notes (Signed)
 PROGRESS NOTE        PATIENT DETAILS Name: Vanessa Wells Age: 58 y.o. Sex: female Date of Birth: 04-Apr-1966 Admit Date: 12/06/2023 Admitting Physician Marinda Elk, MD FAO:[id number], Adrian Saran, MD  Brief Summary: Patient is a 58 y.o.  female with history of STEMI requiring PCI to LAD on 3/14-on DAPT with aspirin/Brilinta-presenting with upper GI bleed and acute blood loss anemia.  Significant events: 3/28>> admit to [facility] 3/30>> EGD with oozing jejunal ulcer 4/1>> diet advanced to full liquids-unfortunately had frank hematochezia with hypotension and syncope.  Repeat EGD ulceration ingestional with adherent clot-s/p treatment with purastat 4/2>> Hb dropped to 6.8-given 1 unit of PRBC-no overt bleeding.  Significant studies: None  Significant microbiology data: None  Procedures: 3/30>> EGD: Oozing jejunal ulcers with known bleeding visible vessel-treated with purastat 4/01>> EGD: 3 areas of ulceration-2 clean-based-largest with adherent clot treated with purastat  Consults: GI Cardiology  Subjective:   Patient in bed, appears comfortable, denies any headache, no fever, no chest pain or pressure, no shortness of breath , no abdominal pain. No new focal weakness.   Objective: Vitals: Blood pressure 118/73, pulse 61, temperature 98 F (36.7 C), temperature source Oral, resp. rate 12, height 5\' 4"  (1.626 m), weight 63.5 kg, SpO2 97%.   Exam:  Awake Alert, No new F.N deficits, Normal affect [place].AT,PERRAL Supple Neck, No JVD,   Symmetrical Chest wall movement, Good air movement bilaterally, CTAB RRR,No Gallops, Rubs or new Murmurs,  +ve B.Sounds, Abd Soft, No tenderness,   No Cyanosis, Clubbing or edema    Assessment/Plan:  Upper GI bleed with acute blood loss anemia Secondary to bleeding jejunal ulcers in the setting of DAPT use for recent STEMI Rebled on 4/1 requiring repeat EGD Has required a total of 4 units of PRBC transfusion so  far-last transfused 1/2 H&H now appears to be stable but had another melanotic stool morning of 12/15/2023 with some clots likely old blood, will monitor another 24 hours.  Recent STEMI with PCI to LAD on 3/14 (3 overlapping stents) Evaluated by Dr. Jacinto Halim on 3/31-aspirin discontinued-Brilinta initially continued-had recurrent GI bleeding on 4/1-subsequently reevaluated by Dr. Jacinto Halim on 4/2-switched from [place] to Plavix.  Now to be on Plavix only with outpatient follow-up with Dr. Jacinto Halim. Thankfully no anginal symptoms.  HTN BP improving add low-dose beta-blocker  HLD Continue statin  Mood disorder Continue sertraline.  BMI: Estimated body mass index is 24.03 kg/m as calculated from the following:   Height as of this encounter: 5\' 4"  (1.626 m).   Weight as of this encounter: 63.5 kg.   Code status:   Code Status: Full Code   DVT Prophylaxis: SCDs Start: 12/07/23 0625   Family Communication: None at bedside   Disposition Plan: Status is: Inpatient Remains inpatient appropriate because: Severity of illness   Planned Discharge Destination:Home   Diet: Diet Order             Diet NPO time specified Except for: Sips with Meds, Ice Chips  Diet effective now                     Antimicrobial agents: Anti-infectives (From admission, onward)    None        MEDICATIONS: Scheduled Meds:  sodium chloride   Intravenous Once   atorvastatin  80 mg Oral Daily   clopidogrel  75  mg Oral Daily   feeding supplement  1 Container Oral TID BM   pantoprazole (PROTONIX) IV  40 mg Intravenous Q12H   potassium chloride  40 mEq Oral Once   sertraline  100 mg Oral Daily   sucralfate  1 g Oral Q6H   Continuous Infusions:   PRN Meds:.acetaminophen, ipratropium-albuterol, ondansetron (ZOFRAN) IV   I have personally reviewed following labs and imaging studies  LABORATORY DATA: CBC: Recent Labs  Lab 12/11/23 2122 12/12/23 0833 12/13/23 0531 12/14/23 0542  12/15/23 0637  WBC 8.9 7.1 8.4 7.1 6.6  NEUTROABS  --   --   --   --  4.0  HGB 6.8* 8.6* 8.8* 8.6* 8.9*  HCT 20.0* 25.0* 26.0* 25.8* 26.9*  MCV 92.6 88.3 90.3 91.8 93.4  PLT 333 325 339 350 374    Basic Metabolic Panel: Recent Labs  Lab 12/11/23 0732 12/12/23 0833 12/13/23 0531 12/14/23 0542 12/15/23 0637  NA 140 143 144 144 143  K 4.0 3.4* 3.7 3.2* 3.5  CL 111 112* 110 110 109  CO2 20* 19* 26 23 28   GLUCOSE 109* 82 88 85 84  BUN 10 7 6 6 7   CREATININE 0.51 0.52 0.56 0.67 0.79  CALCIUM 7.8* 8.2* 8.2* 8.3* 8.3*  MG  --   --   --   --  1.9  PHOS  --   --   --   --  3.7    GFR: Estimated Creatinine Clearance: 68.6 mL/min (by C-G formula based on SCr of 0.79 mg/dL).  Liver Function Tests: No results for input(s): "AST", "ALT", "ALKPHOS", "BILITOT", "PROT", "ALBUMIN" in the last 168 hours.  No results for input(s): "LIPASE", "AMYLASE" in the last 168 hours.  No results for input(s): "AMMONIA" in the last 168 hours.  Coagulation Profile: No results for input(s): "INR", "PROTIME" in the last 168 hours.   Cardiac Enzymes: No results for input(s): "CKTOTAL", "CKMB", "CKMBINDEX", "TROPONINI" in the last 168 hours.  BNP (last 3 results) No results for input(s): "PROBNP" in the last 8760 hours.  Lipid Profile: No results for input(s): "CHOL", "HDL", "LDLCALC", "TRIG", "CHOLHDL", "LDLDIRECT" in the last 72 hours.  Thyroid Function Tests: No results for input(s): "TSH", "T4TOTAL", "FREET4", "T3FREE", "THYROIDAB" in the last 72 hours.  Anemia Panel: No results for input(s): "VITAMINB12", "FOLATE", "FERRITIN", "TIBC", "IRON", "RETICCTPCT" in the last 72 hours.  Urine analysis:    Component Value Date/Time   COLORURINE YELLOW 12/07/2023 0639   APPEARANCEUR HAZY (A) 12/07/2023 0639   LABSPEC 1.029 12/07/2023 0639   PHURINE 5.0 12/07/2023 0639   GLUCOSEU NEGATIVE 12/07/2023 0639   HGBUR NEGATIVE 12/07/2023 0639   BILIRUBINUR NEGATIVE 12/07/2023 0639   KETONESUR 20  (A) 12/07/2023 0639   PROTEINUR 30 (A) 12/07/2023 0639   UROBILINOGEN 1.0 04/07/2009 2004   NITRITE NEGATIVE 12/07/2023 0639   LEUKOCYTESUR NEGATIVE 12/07/2023 0639    Sepsis Labs: Lactic Acid, Venous    Component Value Date/Time   LATICACIDVEN 1.1 12/06/2023 1846    MICROBIOLOGY: No results found for this or any previous visit (from the past 240 hours).  RADIOLOGY STUDIES/RESULTS: No results found.   LOS: 8 days   Signature  -    Susa Raring M.D on 12/15/2023 at 8:37 AM   -  To page go to [email or web address]

## 2023-12-16 ENCOUNTER — Other Ambulatory Visit (HOSPITAL_COMMUNITY): Payer: Self-pay

## 2023-12-16 DIAGNOSIS — K922 Gastrointestinal hemorrhage, unspecified: Secondary | ICD-10-CM | POA: Diagnosis not present

## 2023-12-16 LAB — CBC WITH DIFFERENTIAL/PLATELET
Abs Immature Granulocytes: 0.01 10*3/uL (ref 0.00–0.07)
Basophils Absolute: 0 10*3/uL (ref 0.0–0.1)
Basophils Relative: 0 %
Eosinophils Absolute: 0.4 10*3/uL (ref 0.0–0.5)
Eosinophils Relative: 5 %
HCT: 27.8 % — ABNORMAL LOW (ref 36.0–46.0)
Hemoglobin: 9.1 g/dL — ABNORMAL LOW (ref 12.0–15.0)
Immature Granulocytes: 0 %
Lymphocytes Relative: 30 %
Lymphs Abs: 2.2 10*3/uL (ref 0.7–4.0)
MCH: 31.2 pg (ref 26.0–34.0)
MCHC: 32.7 g/dL (ref 30.0–36.0)
MCV: 95.2 fL (ref 80.0–100.0)
Monocytes Absolute: 0.5 10*3/uL (ref 0.1–1.0)
Monocytes Relative: 6 %
Neutro Abs: 4.2 10*3/uL (ref 1.7–7.7)
Neutrophils Relative %: 59 %
Platelets: 375 10*3/uL (ref 150–400)
RBC: 2.92 MIL/uL — ABNORMAL LOW (ref 3.87–5.11)
RDW: 17.9 % — ABNORMAL HIGH (ref 11.5–15.5)
WBC: 7.3 10*3/uL (ref 4.0–10.5)
nRBC: 0 % (ref 0.0–0.2)

## 2023-12-16 MED ORDER — SUCRALFATE 1 G PO TABS
1.0000 g | ORAL_TABLET | Freq: Four times a day (QID) | ORAL | 0 refills | Status: DC
  Filled 2023-12-16: qty 48, 12d supply, fill #0

## 2023-12-16 MED ORDER — PANTOPRAZOLE SODIUM 40 MG PO TBEC
40.0000 mg | DELAYED_RELEASE_TABLET | Freq: Two times a day (BID) | ORAL | 2 refills | Status: DC
  Filled 2023-12-16 – 2024-01-13 (×2): qty 60, 30d supply, fill #0

## 2023-12-16 MED ORDER — CLOPIDOGREL BISULFATE 75 MG PO TABS
75.0000 mg | ORAL_TABLET | Freq: Every day | ORAL | 3 refills | Status: DC
  Filled 2023-12-16 – 2024-01-13 (×2): qty 30, 30d supply, fill #0

## 2023-12-16 NOTE — Plan of Care (Signed)
  Problem: Education: Goal: Knowledge of General Education information will improve Description: Including pain rating scale, medication(s)/side effects and non-pharmacologic comfort measures Outcome: Completed/Met   Problem: Health Behavior/Discharge Planning: Goal: Ability to manage health-related needs will improve Outcome: Completed/Met   Problem: Clinical Measurements: Goal: Ability to maintain clinical measurements within normal limits will improve Outcome: Completed/Met Goal: Will remain free from infection Outcome: Completed/Met Goal: Diagnostic test results will improve Outcome: Completed/Met Goal: Respiratory complications will improve Outcome: Completed/Met Goal: Cardiovascular complication will be avoided Outcome: Completed/Met   Problem: Activity: Goal: Risk for activity intolerance will decrease Outcome: Completed/Met   Problem: Nutrition: Goal: Adequate nutrition will be maintained Outcome: Completed/Met   Problem: Coping: Goal: Level of anxiety will decrease Outcome: Completed/Met   Problem: Elimination: Goal: Will not experience complications related to bowel motility Outcome: Completed/Met Goal: Will not experience complications related to urinary retention Outcome: Completed/Met   Problem: Pain Managment: Goal: General experience of comfort will improve and/or be controlled Outcome: Completed/Met   Problem: Safety: Goal: Ability to remain free from injury will improve Outcome: Completed/Met   Problem: Skin Integrity: Goal: Risk for impaired skin integrity will decrease Outcome: Completed/Met   Problem: Education: Goal: Ability to identify signs and symptoms of gastrointestinal bleeding will improve Outcome: Completed/Met   Problem: Bowel/Gastric: Goal: Will show no signs and symptoms of gastrointestinal bleeding Outcome: Completed/Met   Problem: Fluid Volume: Goal: Will show no signs and symptoms of excessive bleeding Outcome:  Completed/Met   Problem: Clinical Measurements: Goal: Complications related to the disease process, condition or treatment will be avoided or minimized Outcome: Completed/Met

## 2023-12-16 NOTE — Discharge Summary (Signed)
 Vanessa Wells NWG:[id number] DOB: 1966-04-09 DOA: 12/06/2023  PCP: Renaye Rakers, MD  Admit date: 12/06/2023  Discharge date: 12/16/2023  Admitted From: Home   Disposition:  Home   Recommendations for Outpatient Follow-up:   Follow up with PCP in 1-2 weeks  PCP Please obtain BMP/CBC, 2 view CXR in 1week,  (see Discharge instructions)   PCP Please follow up on the following pending results:    Home Health: None   Equipment/Devices: None  Consultations: GI, Cards Discharge Condition: Stable    CODE STATUS: Full    Diet Recommendation: Heart Healthy     Chief Complaint  Patient presents with   Weakness     Brief history of present illness from the day of admission and additional interim summary     58 y.o.  female with history of STEMI requiring PCI to LAD on 3/14-on DAPT with aspirin/Brilinta-presenting with upper GI bleed and acute blood loss anemia.   Significant events: 3/28>> admit to [facility] 3/30>> EGD with oozing jejunal ulcer 4/1>> diet advanced to full liquids-unfortunately had frank hematochezia with hypotension and syncope.  Repeat EGD ulceration ingestional with adherent clot-s/p treatment with purastat 4/2>> Hb dropped to 6.8-given 1 unit of PRBC-no overt bleeding.   Significant studies: None   Significant microbiology data: None   Procedures: 3/30>> EGD: Oozing jejunal ulcers with known bleeding visible vessel-treated with purastat 4/01>> EGD: 3 areas of ulceration-2 clean-based-largest with adherent clot treated with purastat                                                                 Hospital Course   Upper GI bleed with acute blood loss anemia Secondary to bleeding jejunal ulcers in the setting of DAPT use for recent STEMI Rebled on 4/1 requiring repeat EGD Has required a total  of 4 units of PRBC transfusion so far now CBC and H&H stable, no signs of ongoing acute bleed, will be discharged on PPI twice daily along with Carafate for 2 weeks with outpatient GI and PCP follow-up.  She is completely symptom-free and eager to go home.    Recent STEMI with PCI to LAD on 3/14 (3 overlapping stents) Evaluated by Dr. Jacinto Halim on 3/31-aspirin discontinued-Brilinta initially continued-had recurrent GI bleeding on 4/1-subsequently reevaluated by Dr. Jacinto Halim on 4/2-switched from [place] to Plavix.  Now to be on Plavix only with outpatient follow-up with Dr. Jacinto Halim. She is at baseline no new symptoms.   HTN BP stable continue home regimen of beta-blocker.   HLD Continue statin   Mood disorder Continue sertraline.    Discharge diagnosis     Principal Problem:   Acute GI bleeding Active Problems:   Hypercholesterolemia   Essential hypertension   Acute blood loss anemia   CAD S/P percutaneous coronary angioplasty   Marginal ulcer  Antiplatelet or antithrombotic long-term use   Coronary artery disease involving native coronary artery of native heart without angina pectoris    Discharge instructions    Discharge Instructions     Diet - low sodium heart healthy   Complete by: As directed    Discharge instructions   Complete by: As directed    Follow with Primary MD Renaye Rakers, MD in 2-3 days   Get CBC, CMP, 2 view Chest X ray -  checked next visit with your primary MD   Activity: As tolerated with Full fall precautions use walker/cane & assistance as needed  Disposition Home    Diet: Heart Healthy    Special Instructions: If you have smoked or chewed Tobacco  in the last 2 yrs please stop smoking, stop any regular Alcohol  and or any Recreational drug use.  On your next visit with your primary care physician please Get Medicines reviewed and adjusted.  Please request your Prim.MD to go over all Hospital Tests and Procedure/Radiological results at the  follow up, please get all Hospital records sent to your Prim MD by signing hospital release before you go home.  If you experience worsening of your admission symptoms, develop shortness of breath, life threatening emergency, suicidal or homicidal thoughts you must seek medical attention immediately by calling 911 or calling your MD immediately  if symptoms less severe.  You Must read complete instructions/literature along with all the possible adverse reactions/side effects for all the Medicines you take and that have been prescribed to you. Take any new Medicines after you have completely understood and accpet all the possible adverse reactions/side effects.   Do not drive when taking Pain medications.  Do not take more than prescribed Pain, Sleep and Anxiety Medications  Wear Seat belts while driving.   Increase activity slowly   Complete by: As directed        Discharge Medications   Allergies as of 12/16/2023       Reactions   Niaspan [niacin] Hives, Swelling   Burning sensation, passed out        Medication List     STOP taking these medications    aspirin 81 MG chewable tablet   ticagrelor 90 MG Tabs tablet Commonly known as: BRILINTA       TAKE these medications    atorvastatin 80 MG tablet Commonly known as: LIPITOR Take 80 mg by mouth daily.   clopidogrel 75 MG tablet Commonly known as: PLAVIX Take 1 tablet (75 mg total) by mouth daily.   metoCLOPramide 10 MG tablet Commonly known as: REGLAN Take 1 tablet (10 mg total) by mouth every 6 (six) hours.   metoprolol succinate 50 MG 24 hr tablet Commonly known as: TOPROL-XL Take 1 tablet by mouth daily.   pantoprazole 40 MG tablet Commonly known as: Protonix Take 1 tablet (40 mg total) by mouth 2 (two) times daily.   sertraline 100 MG tablet Commonly known as: ZOLOFT Take 100 mg by mouth daily.   sucralfate 1 g tablet Commonly known as: CARAFATE Take 1 tablet (1 g total) by mouth every 6 (six)  hours for 12 days.   True Vitamin D3 1.25 MG (50000 UT) Tabs Generic drug: Cholecalciferol Take 50,000 Units by mouth every Sunday.         Follow-up Information     Connect with your PCP/Specialist as discussed. Schedule an appointment as soon as possible for a visit .   Contact information: [email or web address] Call our physician referral line  at [phone].        Yates Decamp, MD Follow up on 01/10/2024.   Specialty: Cardiology Why: May 2 at 11:00 Contact information: [address] [place] [address] [phone]         Renaye Rakers, MD. Schedule an appointment as soon as possible for a visit in 3 day(s).   Specialty: Family Medicine Contact information: [address] [place] [address] [phone]         Benancio Deeds, MD. Schedule an appointment as soon as possible for a visit in 1 week(s).   Specialty: Gastroenterology Contact information: [address] [place] [address] [phone]                 Major procedures and Radiology Reports - PLEASE review detailed and final reports thoroughly  -     CT ABDOMEN PELVIS W CONTRAST Result Date: 11/20/2023 CLINICAL DATA:  Bowel obstruction suspected upper abdominal pain constipation EXAM: CT ABDOMEN AND PELVIS WITH CONTRAST TECHNIQUE: Multidetector CT imaging of the abdomen and pelvis was performed using the standard protocol following bolus administration of intravenous contrast. RADIATION DOSE REDUCTION: This exam was performed according to the departmental dose-optimization program which includes automated exposure control, adjustment of the mA and/or kV according to patient size and/or use of iterative reconstruction technique. CONTRAST:  OMNIPAQUE IOHEXOL 300 MG/ML  SOLN COMPARISON:  None Available. FINDINGS: Lower chest: Lung bases demonstrate no acute airspace disease. Hepatobiliary: 2.8 cm hypodense mass in the left  hepatic lobe with patchy peripheral enhancement, progressive on delayed views and suggestive of a hemangioma. Surgical clips in the right upper quadrant. Mild intra hepatic and extrahepatic biliary dilatation. Common bile duct dilated up to 10 mm. Pancreas: Unremarkable. No pancreatic ductal dilatation or surrounding inflammatory changes. Spleen: Normal in size without focal abnormality. Adrenals/Urinary Tract: Adrenal glands are unremarkable. Kidneys are normal, without renal calculi, focal lesion, or hydronephrosis. Bladder is unremarkable. Stomach/Bowel: Status post gastric bypass. No dilated small bowel. No acute bowel wall thickening. Negative appendix. Moderate stool burden Vascular/Lymphatic: Aortic atherosclerosis. No enlarged abdominal or pelvic lymph nodes. Reproductive: Status post hysterectomy. No adnexal masses. Other: Negative for pelvic effusion Musculoskeletal: No acute or suspicious osseous abnormality IMPRESSION: 1. No CT evidence for acute intra-abdominal or pelvic abnormality. No evidence for bowel obstruction 2. Status post gastric bypass. Moderate stool burden. 3. Mild intra and extrahepatic biliary dilatation, post cholecystectomy and probably related to this history but suggest correlation with LFTs. 4. 2.8 cm hypodense mass in the left hepatic lobe with imaging characteristics suggestive of a hemangioma. 5. Aortic atherosclerosis. Electronically Signed   By: Jasmine Pang M.D.   On: 11/20/2023 22:38    Micro Results    No results found for this or any previous visit (from the past 240 hours).  Today   Subjective    Vanessa Wells today has no headache,no chest abdominal pain,no new weakness tingling or numbness, feels much better wants to go home today.    Objective   Blood pressure 119/81, pulse 78, temperature 97.8 F (36.6 C), temperature source Oral, resp. rate 20, height 5\' 4"  (1.626 m), weight 63.5 kg, SpO2 96%.  No intake or output data in the 24 hours ending  12/16/23 0747  Exam  Awake Alert, No new F.N deficits,    [place].AT,PERRAL Supple Neck,   Symmetrical Chest wall movement, Good air movement bilaterally, CTAB RRR,No Gallops,   +ve B.Sounds, Abd Soft, Non tender,  No  Cyanosis, Clubbing or edema    Data Review   Recent Labs  Lab 12/12/23 0833 12/13/23 0531 12/14/23 0542 12/15/23 0637 12/16/23 0322  WBC 7.1 8.4 7.1 6.6 7.3  HGB 8.6* 8.8* 8.6* 8.9* 9.1*  HCT 25.0* 26.0* 25.8* 26.9* 27.8*  PLT 325 339 350 374 375  MCV 88.3 90.3 91.8 93.4 95.2  MCH 30.4 30.6 30.6 30.9 31.2  MCHC 34.4 33.8 33.3 33.1 32.7  RDW 17.6* 17.4* 17.4* 17.6* 17.9*  LYMPHSABS  --   --   --  1.7 2.2  MONOABS  --   --   --  0.5 0.5  EOSABS  --   --   --  0.4 0.4  BASOSABS  --   --   --  0.0 0.0    Recent Labs  Lab 12/11/23 0732 12/12/23 0833 12/13/23 0531 12/14/23 0542 12/15/23 0637  NA 140 143 144 144 143  K 4.0 3.4* 3.7 3.2* 3.5  CL 111 112* 110 110 109  CO2 20* 19* 26 23 28   ANIONGAP 9 12 8 11 6   GLUCOSE 109* 82 88 85 84  BUN 10 7 6 6 7   CREATININE 0.51 0.52 0.56 0.67 0.79  MG  --   --   --   --  1.9  PHOS  --   --   --   --  3.7  CALCIUM 7.8* 8.2* 8.2* 8.3* 8.3*    Total Time in preparing paper work, data evaluation and todays exam - 35 minutes  Signature  -    Susa Raring M.D on 12/16/2023 at 7:47 AM   -  To page go to [email or web address]

## 2023-12-16 NOTE — Plan of Care (Signed)
  Problem: Clinical Measurements: Goal: Ability to maintain clinical measurements within normal limits will improve Outcome: Progressing   Problem: Activity: Goal: Risk for activity intolerance will decrease Outcome: Progressing   Problem: Coping: Goal: Level of anxiety will decrease Outcome: Progressing   

## 2023-12-16 NOTE — TOC Transition Note (Signed)
 Transition of Care [facility]) - Discharge Note   Patient Details  Name: Vanessa Wells MRN: [id number] Date of Birth: 1966/05/13  Transition of [facility]) CM/SW Contact:  Gordy Clement, RN Phone Number: 12/16/2023, 10:17 AM   Clinical Narrative:     Patient will DC to home  No TOC needs identified  Patient will follow up as directed in AVS       Barriers to Discharge: Continued Medical Work up   Patient Goals and CMS Choice            Discharge Placement                       Discharge Plan and Services Additional resources added to the After Visit Summary for                                       Social Drivers of Health (SDOH) Interventions SDOH Screenings   Food Insecurity: No Food Insecurity (12/07/2023)  Housing: Low Risk  (12/07/2023)  Transportation Needs: No Transportation Needs (12/07/2023)  Utilities: Not At Risk (12/07/2023)  Tobacco Use: Low Risk  (12/10/2023)     Readmission Risk Interventions     No data to display

## 2023-12-24 ENCOUNTER — Telehealth: Payer: Self-pay | Admitting: Cardiology

## 2023-12-24 NOTE — Telephone Encounter (Signed)
 New Message:      Patient said she had a heart attack on March 14th and have been back to the hospital 2 times since that time. She said she had to have 4 blood transfusions. Her question is, will it be alright for her to travel to the mountains in [place]   for 4 days on 01-06-24?

## 2023-12-24 NOTE — Telephone Encounter (Signed)
 Spoke to patient she stated she wanted to ask Dr.Ganji if ok to travel to [place]  in a 16 passenger [employer] for a few days.Stated [employer] will make frequent stops along the way.Advised I will send message to [facility] for advice.

## 2023-12-24 NOTE — Telephone Encounter (Signed)
Should be no problem 

## 2023-12-25 NOTE — Telephone Encounter (Signed)
 Spoke to patient Dr.Ganji's advice given.

## 2024-01-10 ENCOUNTER — Ambulatory Visit: Admitting: Cardiology

## 2024-01-13 ENCOUNTER — Other Ambulatory Visit: Payer: Self-pay

## 2024-01-13 ENCOUNTER — Other Ambulatory Visit (HOSPITAL_COMMUNITY): Payer: Self-pay

## 2024-01-14 ENCOUNTER — Other Ambulatory Visit (HOSPITAL_COMMUNITY): Payer: Self-pay

## 2024-01-15 ENCOUNTER — Other Ambulatory Visit (HOSPITAL_COMMUNITY): Payer: Self-pay

## 2024-01-15 ENCOUNTER — Encounter: Payer: Self-pay | Admitting: Cardiology

## 2024-01-15 ENCOUNTER — Ambulatory Visit: Attending: Cardiology | Admitting: Cardiology

## 2024-01-15 VITALS — BP 120/57 | HR 75 | Resp 16 | Ht 64.0 in | Wt 161.2 lb

## 2024-01-15 DIAGNOSIS — Z8719 Personal history of other diseases of the digestive system: Secondary | ICD-10-CM | POA: Diagnosis not present

## 2024-01-15 DIAGNOSIS — D500 Iron deficiency anemia secondary to blood loss (chronic): Secondary | ICD-10-CM

## 2024-01-15 DIAGNOSIS — E7800 Pure hypercholesterolemia, unspecified: Secondary | ICD-10-CM

## 2024-01-15 DIAGNOSIS — I2510 Atherosclerotic heart disease of native coronary artery without angina pectoris: Secondary | ICD-10-CM

## 2024-01-15 LAB — LIPID PANEL

## 2024-01-15 MED ORDER — NITROGLYCERIN 0.4 MG SL SUBL: 0.4000 mg | SUBLINGUAL_TABLET | SUBLINGUAL | 1 refills | Status: AC | PRN

## 2024-01-15 MED ORDER — CLOPIDOGREL BISULFATE 75 MG PO TABS
75.0000 mg | ORAL_TABLET | Freq: Every day | ORAL | 3 refills | Status: AC
Start: 2024-01-15 — End: ?

## 2024-01-15 NOTE — Progress Notes (Signed)
 Cardiology Office Note:  .   Date:  01/15/2024  ID:  Vanessa Wells, DOB 16-Aug-1966, MRN [id number] PCP: Jonathon Neighbors, MD  [facility] Providers Cardiologist:  Knox Perl, MD   History of Present Illness: .   Vanessa Wells is a 58 y.o. African-American female patient with acute anterior STEMI on 11/22/2023 SP proximal LAD and mid LAD 2 overlapping stents performed under ultrasound guidance at [facility], presenting to the hospital on 12/06/2023 with ongoing abdominal discomfort and found to have peptic ulcer disease with large ulceration and active bleeding needing 4 units of blood transfusion.  Prior history significant for gastric bypass surgery with 200 pound weight loss, hypertension, hypercholesterolemia, resolved diabetes.   Aspirin  had to be discontinued and Brilinta  was also discontinued and eventually switched over to Plavix .  She now presents for follow-up.  Discussed the use of AI scribe software for clinical note transcription with the patient, who gave verbal consent to proceed.  History of Present Illness Vanessa Wells is a 58 year old female with coronary artery disease who presents for follow-up after a recent hospitalization for gastrointestinal bleeding. During a recent trip to the mountains, she experienced worsening leg swelling, which has since improved, though the leg remains sore. She occasionally feels a 'flutter' and strain with certain movements, but no pain is present.  She is currently taking Plavix  75 mg once daily, metoprolol  succinate 50 mg once daily, and atorvastatin  80 mg once daily. She tolerates Plavix  well and has recently received a refill for her medications.  Regarding her coronary artery disease, she has not experienced any chest pain since her last visit.  Labs    Lab Results  Component Value Date   NA 143 12/15/2023   K 3.5 12/15/2023   CO2 28 12/15/2023   GLUCOSE 84 12/15/2023   BUN 7 12/15/2023   CREATININE 0.79  12/15/2023   CALCIUM  8.3 (L) 12/15/2023   GFRNONAA >60 12/15/2023      Latest Ref Rng & Units 12/15/2023    6:37 AM 12/14/2023    5:42 AM 12/13/2023    5:31 AM  BMP  Glucose 70 - 99 mg/dL 84  85  88   BUN 6 - 20 mg/dL 7  6  6    Creatinine 0.44 - 1.00 mg/dL 3.08  6.57  8.46   Sodium 135 - 145 mmol/L 143  144  144   Potassium 3.5 - 5.1 mmol/L 3.5  3.2  3.7   Chloride 98 - 111 mmol/L 109  110  110   CO2 22 - 32 mmol/L 28  23  26    Calcium  8.9 - 10.3 mg/dL 8.3  8.3  8.2       Latest Ref Rng & Units 12/16/2023    3:22 AM 12/15/2023    6:37 AM 12/14/2023    5:42 AM  CBC  WBC 4.0 - 10.5 K/uL 7.3  6.6  7.1   Hemoglobin 12.0 - 15.0 g/dL 9.1  8.9  8.6   Hematocrit 36.0 - 46.0 % 27.8  26.9  25.8   Platelets 150 - 400 K/uL 375  374  350    No results found for: "HGBA1C"  No results found for: "TSH"   External Labs:  Care everywhere labs 11/22/2023:  Total cholesterol 160, triglycerides 57, HDL 55, LDL 91.  ROS  Review of Systems  Cardiovascular:  Positive for palpitations (brief). Negative for chest pain, dyspnea on exertion and leg swelling.    Physical  Exam:   VS:  BP (!) 120/57 (BP Location: Left Arm, Patient Position: Sitting, Cuff Size: Large)   Pulse 75   Resp 16   Ht 5\' 4"  (1.626 m)   Wt 161 lb 3.2 oz (73.1 kg)   SpO2 97%   BMI 27.67 kg/m    Wt Readings from Last 3 Encounters:  01/15/24 161 lb 3.2 oz (73.1 kg)  12/10/23 139 lb 15.9 oz (63.5 kg)  11/20/23 159 lb (72.1 kg)    Physical Exam Neck:     Vascular: No carotid bruit or JVD.  Cardiovascular:     Rate and Rhythm: Normal rate and regular rhythm.     Pulses: Intact distal pulses.     Heart sounds: Normal heart sounds. No murmur heard.    No gallop.  Pulmonary:     Effort: Pulmonary effort is normal.     Breath sounds: Normal breath sounds.  Abdominal:     General: Bowel sounds are normal.     Palpations: Abdomen is soft.  Musculoskeletal:     Right lower leg: No edema.     Left lower leg: No edema.     Studies Reviewed: Aaron Aas    Coronary angiogram for acute anterior STEMI at [facility] on 11/22/2023:   Ost LAD lesion is 70% stenosed, reduced to 0%.  SP 4.0 x 18 mm Xience DES.   Mid LAD lesion is 100% stenosed, reduced to 0%.  SP 2.5 x 33 and a 3.0 x 23 mm overlapping stents. Done under US  guidance.    Echocardiogram 11/23/2023: There is mild concentric left ventricular hypertrophy.  The left ventricular cavity is small.  Global LV systolic function is preserved  Severe apical and mild mid anterior and anteroseptal hypokinesis.  LV ejection fraction = 55-60%. Left ventricular filling pattern is indeterminate.  The right ventricle is normal in size and function.  There is mild to moderate tricuspid regurgitation.  Mild to moderate pulmonary hypertension.   EKG:    EKG Interpretation Date/Time:  Wednesday Jan 15 2024 14:25:16 EDT Ventricular Rate:  60 PR Interval:  186 QRS Duration:  66 QT Interval:  378 QTC Calculation: 378 R Axis:   -16  Text Interpretation: EKG 01/15/2024: Normal sinus rhythm at rate of 60 bpm, normal axis, poor R progression, cannot exclude anteroseptal infarct old.  Low-voltage complexes. Pulmonary disease pattern.  No significant change from 12/06/2023. Confirmed by Charlene Detter, Jagadeesh ([id number]) on 01/15/2024 2:31:10 PM    Medications and allergies    Allergies  Allergen Reactions   Niaspan [Niacin] Hives and Swelling    Burning sensation, passed out     Current Outpatient Medications:    atorvastatin  (LIPITOR) 80 MG tablet, Take 80 mg by mouth daily., Disp: , Rfl:    Cholecalciferol (TRUE VITAMIN D3) 1.25 MG (50000 UT) TABS, Take 50,000 Units by mouth every Sunday., Disp: , Rfl:    metoprolol  succinate (TOPROL -XL) 50 MG 24 hr tablet, Take 1 tablet by mouth daily., Disp: , Rfl:    nitroGLYCERIN (NITROSTAT) 0.4 MG SL tablet, Place 1 tablet (0.4 mg total) under the tongue every 5 (five) minutes as needed., Disp: 25 tablet, Rfl: 1   pantoprazole  (PROTONIX ) 40 MG  tablet, Take 1 tablet (40 mg total) by mouth 2 (two) times daily., Disp: 60 tablet, Rfl: 2   clopidogrel  (PLAVIX ) 75 MG tablet, Take 1 tablet (75 mg total) by mouth daily., Disp: 90 tablet, Rfl: 3   sucralfate  (CARAFATE ) 1 g tablet, Take 1 tablet (1 g  total) by mouth every 6 (six) hours for 12 days., Disp: 48 tablet, Rfl: 0   Meds ordered this encounter  Medications   clopidogrel  (PLAVIX ) 75 MG tablet    Sig: Take 1 tablet (75 mg total) by mouth daily.    Dispense:  90 tablet    Refill:  3   nitroGLYCERIN (NITROSTAT) 0.4 MG SL tablet    Sig: Place 1 tablet (0.4 mg total) under the tongue every 5 (five) minutes as needed.    Dispense:  25 tablet    Refill:  1     Medications Discontinued During This Encounter  Medication Reason   sertraline  (ZOLOFT ) 100 MG tablet Patient Preference   metoCLOPramide  (REGLAN ) 10 MG tablet Patient Preference   clopidogrel  (PLAVIX ) 75 MG tablet Reorder     ASSESSMENT AND PLAN: .      ICD-10-CM   1. Coronary artery disease involving native coronary artery of native heart without angina pectoris  I25.10 EKG 12-Lead    clopidogrel  (PLAVIX ) 75 MG tablet    nitroGLYCERIN (NITROSTAT) 0.4 MG SL tablet    2. Hypercholesterolemia  E78.00 Lipid panel    3. H/O: upper GI bleed 12/06/23: Jejunal ulcer  Z87.19     4. Blood loss anemia  D50.0 Anemia panel     Assessment & Plan Coronary artery disease   Coronary artery disease is well-managed with no recent chest pain. Occasional fluttering and strain-like sensations are not cardiac-related. She is on Plavix , metoprolol  succinate, and atorvastatin . The nature of coronary artery disease and the role of stents were discussed. She was reassured that with current medications, the risk of myocardial infarction is low. No activity restrictions from a cardiac standpoint.  Continue Plavix  75 mg once daily, metoprolol  succinate 50 mg once daily, and atorvastatin  80 mg once daily. Prescribe nitroglycerin for emergency use  in case of chest pain. Send Plavix  prescription for one year supply to [facility] in [place]. She is not on DAPT due to severe life-threatening GI bleed.  Hyperlipidemia   LDL cholesterol was 91 mg/dL in March, above the target of less than 70 mg/dL. She is currently on atorvastatin  80 mg once daily, which is expected to lower LDL levels. Reassess with a cholesterol panel today. Order cholesterol panel today.  Gastrointestinal bleeding   Gastrointestinal bleeding on December 06, 2023, required four units of packed red blood cells. No current bleeding reported. She has an upcoming appointment with a GI specialist on Jan 24, 2024. Plan to check iron levels to ensure no ongoing anemia. Order CBC and iron levels today to assess for anemia and iron deficiency. Coordinate with GI specialist for follow-up on Jan 24, 2024.  Anemia due to blood loss   Anemia is secondary to previous gastrointestinal bleeding. Monitoring iron levels to determine if further intervention is needed. If iron levels are low, consider iron infusion over oral supplementation. Order CBC and iron levels today. Consider iron infusion if iron levels are low. I will see her back in 6 months.  Signed,  Knox Perl, MD, [facility] 01/15/2024, 5:00 PM [facility] [address] [place], [place] [address] Phone: [phone]. Fax:  [phone]

## 2024-01-15 NOTE — Patient Instructions (Signed)
 Medication Instructions:  Your physician recommends that you continue on your current medications as directed. Please refer to the Current Medication list given to you today.  *If you need a refill on your cardiac medications before your next appointment, please call your pharmacy*  Lab Work: TODAY (go to lab on 1st floor): Anemia panel, Lipid panel If you have labs (blood work) drawn today and your tests are completely normal, you will receive your results only by: MyChart Message (if you have MyChart) OR A paper copy in the mail If you have any lab test that is abnormal or we need to change your treatment, we will call you to review the results.  Follow-Up: At [facility], you and your health needs are our priority.  As part of our continuing mission to provide you with exceptional heart care, our providers are all part of one team.  This team includes your primary Cardiologist (physician) and Advanced Practice Providers or APPs (Physician Assistants and Nurse Practitioners) who all work together to provide you with the care you need, when you need it.  Your next appointment:   6 month(s)  The format for your next appointment:   In Person  Provider:   Knox Perl, MD{  We recommend signing up for the patient portal called "MyChart".  Sign up information is provided on this After Visit Summary.  MyChart is used to connect with patients for Virtual Visits (Telemedicine).  Patients are able to view lab/test results, encounter notes, upcoming appointments, etc.  Non-urgent messages can be sent to your provider as well.   To learn more about what you can do with MyChart, go to [email or web address].

## 2024-01-16 ENCOUNTER — Other Ambulatory Visit (HOSPITAL_COMMUNITY): Payer: Self-pay

## 2024-01-16 LAB — ANEMIA PANEL
Ferritin: 66 ng/mL (ref 15–150)
Hematocrit: 33.1 % — ABNORMAL LOW (ref 34.0–46.6)
Iron Saturation: 12 % — ABNORMAL LOW (ref 15–55)
Iron: 40 ug/dL (ref 27–159)
Retic Ct Pct: 2.2 % (ref 0.6–2.6)
Total Iron Binding Capacity: 323 ug/dL (ref 250–450)
UIBC: 283 ug/dL (ref 131–425)
Vitamin B-12: 374 pg/mL (ref 232–1245)

## 2024-01-16 LAB — LIPID PANEL
Cholesterol, Total: 125 mg/dL (ref 100–199)
HDL: 61 mg/dL (ref >39)
LDL CALC COMMENT:: 2 ratio (ref 0.0–4.4)
LDL Chol Calc (NIH): 52 mg/dL (ref 0–99)
Triglycerides: 55 mg/dL (ref 0–149)
VLDL Cholesterol Cal: 12 mg/dL (ref 5–40)

## 2024-01-17 ENCOUNTER — Other Ambulatory Visit (HOSPITAL_COMMUNITY): Payer: Self-pay

## 2024-01-17 ENCOUNTER — Encounter: Payer: Self-pay | Admitting: Cardiology

## 2024-01-17 NOTE — Progress Notes (Signed)
 Iron levels appear appropriate and B12 levels are lower limit of normal. Please start Vit B12 chewable kind of tablets over the counter 1000 or 1200 MCG daily.  Blood count is improving appropriately. All good so far

## 2024-01-21 ENCOUNTER — Ambulatory Visit: Payer: Self-pay | Admitting: *Deleted

## 2024-01-21 ENCOUNTER — Other Ambulatory Visit: Payer: Self-pay | Admitting: *Deleted

## 2024-01-21 MED ORDER — VITAMIN B-12 1000 MCG PO TABS: 1000.0000 ug | ORAL_TABLET | Freq: Every day | ORAL | Status: AC

## 2024-01-22 ENCOUNTER — Other Ambulatory Visit (HOSPITAL_COMMUNITY): Payer: Self-pay

## 2024-01-23 ENCOUNTER — Other Ambulatory Visit (HOSPITAL_COMMUNITY): Payer: Self-pay

## 2024-01-24 ENCOUNTER — Encounter: Payer: Self-pay | Admitting: Nurse Practitioner

## 2024-01-24 ENCOUNTER — Ambulatory Visit (INDEPENDENT_AMBULATORY_CARE_PROVIDER_SITE_OTHER): Admitting: Nurse Practitioner

## 2024-01-24 VITALS — BP 120/80 | HR 55 | Ht 64.0 in | Wt 157.0 lb

## 2024-01-24 DIAGNOSIS — K259 Gastric ulcer, unspecified as acute or chronic, without hemorrhage or perforation: Secondary | ICD-10-CM | POA: Diagnosis not present

## 2024-01-24 DIAGNOSIS — Z9884 Bariatric surgery status: Secondary | ICD-10-CM

## 2024-01-24 DIAGNOSIS — K922 Gastrointestinal hemorrhage, unspecified: Secondary | ICD-10-CM

## 2024-01-24 NOTE — Progress Notes (Signed)
 Agree with assessment and plan as outlined.  I think reasonable to do EGD while on Plavix  as a diagnostic exam to assess for mucosal healing. I think we can wait a few more months and do this. Perhaps in July August timeframe if that is OK for her. If she has any cardiac issues in the interim, we can delay this further if needed. Otherwise continue to try and hemoglobin and iron studies in the interim.

## 2024-01-24 NOTE — Patient Instructions (Signed)
 Continue Pantoprazole  40 mg twice daily.   Your provider has requested that you go to the basement level for lab work on 02/12/24 . Press "B" on the elevator. The lab is located at the first door on the left as you exit the elevator.  Contact office if you develop black or bloody stools.   Our office will contact you regarding timing of your next Upper Endoscopy.   Due to recent changes in healthcare laws, you may see the results of your imaging and laboratory studies on MyChart before your provider has had a chance to review them.  We understand that in some cases there may be results that are confusing or concerning to you. Not all laboratory results come back in the same time frame and the provider may be waiting for multiple results in order to interpret others.  Please give us  48 hours in order for your provider to thoroughly review all the results before contacting the office for clarification of your results.   _______________________________________________________  If your blood pressure at your visit was 140/90 or greater, please contact your primary care physician to follow up on this.  _______________________________________________________  If you are age 40 or older, your body mass index should be between 23-30. Your Body mass index is 26.95 kg/m. If this is out of the aforementioned range listed, please consider follow up with your Primary Care Provider.  If you are age 51 or younger, your body mass index should be between 19-25. Your Body mass index is 26.95 kg/m. If this is out of the aformentioned range listed, please consider follow up with your Primary Care Provider.   ________________________________________________________  The [facility] providers would like to encourage you to use MYCHART to communicate with providers for non-urgent requests or questions.  Due to long hold times on the telephone, sending your provider a message by [facility] may be a faster and more  efficient way to get a response.  Please allow 48 business hours for a response.  Please remember that this is for non-urgent requests.  _______________________________________________________  Thank you for choosing me and [facility].

## 2024-01-24 NOTE — Progress Notes (Signed)
 01/24/2024 [name] [id number] 11/03/1965   Chief Complaint: Hospital follow up, ulcers   History of Present Illness: Vanessa Wells is a 58 year old female with a past medical history of anxiety, hypertension, hypercholesterolemia, coronary artery disease s/p STEMI with 3 coronary stents 11/2023, obesity, diabetes mellitus type II. S/P Roux-en-Y gastric bypass 2003. She was admitted to the hospital 12/06/2023 with symptomatic anemia and upper GI bleed/ melena. Admission Hg 7.0 (Hg 14.4 on 11/19/2013). She underwent an EGD 12/08/2023 which showed a normal esophagus, Roux-en-Y gastrojejunostomy with GJ anastomosis inflammation and friable mucosa and oozing jejunal ulcers with a nonbleeding vessel which was injected.  She had recurrent active GI bleeding and underwent a repeat EGD 12/10/2023 which showed Roux-en-Y gastrojejunostomy with GJ anastomosis with 3 areas of ulceration, 2 clean-based, the largest with large adherent clot which was partially removed and treated with pure stat gel.  She received a total of 4 blood transfusions during this admission.  She was treated with PPI twice daily and Carafate .  Her clinical data stabilized and she was discharged home 12/16/2023 on Pantoprazole  40 mg twice daily, Sucralfate  1 g 4 times daily x 2 weeks.  Brilinta  was discontinued and she was started on Plavix . Hg level 9.1 at time of discharge.   She presents today for further follow-up.  She is accompanied by her cousin.  She endorses feeling quite well.  She denies having any nausea or vomiting.  No heartburn or dysphagia.  No upper or lower abdominal pain.  She is passing normal formed brown bowel movements every 3 days which is her normal bowel pattern.  No bloody or black stools.  She is passing increased gas per the rectum.  She completed the prescribed course of Carafate  and she is adherent with taking Pantoprazole  40 mg twice daily.  Anemia panel 01/15/2024 showed a hematocrit level 33.1, iron  40, iron saturation 12%, vitamin B12 374 and TIBC 323.  No chest pain or shortness of breath, she infrequently feels brief palpitations and a little off balance if she stands up too quickly.     Latest Ref Rng & Units 01/15/2024    3:13 PM 12/16/2023    3:22 AM 12/15/2023    6:37 AM  CBC  WBC 4.0 - 10.5 K/uL  7.3  6.6   Hemoglobin 12.0 - 15.0 g/dL  9.1  8.9   Hematocrit 34.0 - 46.6 % 33.1  27.8  26.9   Platelets 150 - 400 K/uL  375  374         Latest Ref Rng & Units 12/15/2023    6:37 AM 12/14/2023    5:42 AM 12/13/2023    5:31 AM  CMP  Glucose 70 - 99 mg/dL 84  85  88   BUN 6 - 20 mg/dL 7  6  6    Creatinine 0.44 - 1.00 mg/dL 8.46  9.62  9.52   Sodium 135 - 145 mmol/L 143  144  144   Potassium 3.5 - 5.1 mmol/L 3.5  3.2  3.7   Chloride 98 - 111 mmol/L 109  110  110   CO2 22 - 32 mmol/L 28  23  26    Calcium  8.9 - 10.3 mg/dL 8.3  8.3  8.2          Echocardiogram 11/23/2023: There is mild concentric left ventricular hypertrophy.  The left ventricular cavity is small.  Global LV systolic function is preserved  Severe apical and mild mid anterior and anteroseptal hypokinesis.  LV ejection fraction = 55-60%. Left ventricular filling pattern is indeterminate.  The right ventricle is normal in size and function.  There is mild to moderate tricuspid regurgitation.  Mild to moderate pulmonary hypertension.                                 GI Procedures:  EGD 12/10/2023 by Dr. General Kenner as an inpatient for GI bleed - Z-line regular.  - Normal esophagus otherwise  - Roux-en-Y gastrojejunostomy with gastrojejunal anastomosis characterized by 3 areas of ulceration, 2 clean based, the largest with large adherent clot which was partially removed as outlined and treated with Purastat gel.  - Normal examined jejunal limb  EGD 12/08/2023 by Dr. Nickey Barn as an inpatient for GI bleed - Normal esophagus.  - Roux-en-Y gastrojejunostomy with gastrojejunal anastomosis characterized by friable mucosa and  inflammation.  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 Injected.  - No specimens collected.  Col[facility]oscopy 02/2022 at [facility]  Normal colonoscopy 10-year recall.  Current Outpatient Medications on File Prior to Visit  Medication Sig Dispense Refill   atorvastatin  (LIPITOR) 80 MG tablet Take 80 mg by mouth daily.     Cholecalciferol (TRUE VITAMIN D3) 1.25 MG (50000 UT) TABS Take 50,000 Units by mouth every Sunday.     clopidogrel  (PLAVIX ) 75 MG tablet Take 1 tablet (75 mg total) by mouth daily. 90 tablet 3   cyanocobalamin (VITAMIN B12) 1000 MCG tablet Take 1 tablet (1,000 mcg total) by mouth daily.     metoprolol  succinate (TOPROL -XL) 50 MG 24 hr tablet Take 1 tablet by mouth daily.     nitroGLYCERIN  (NITROSTAT ) 0.4 MG SL tablet Place 1 tablet (0.4 mg total) under the tongue every 5 (five) minutes as needed. 25 tablet 1   pantoprazole  (PROTONIX ) 40 MG tablet Take 1 tablet (40 mg total) by mouth 2 (two) times daily. 60 tablet 2   sertraline  (ZOLOFT ) 100 MG tablet Take 100 mg by mouth daily.     No current facility-administered medications on file prior to visit.   Allergies  Allergen Reactions   Niaspan [Niacin] Hives and Swelling    Burning sensation, passed out   Current Medications, Allergies, Past Medical History, Past Surgical History, Family History and Social History were reviewed in [employer] record.  Review of Systems:   Constitutional: Negative for fever, sweats, chills or weight loss.  Respiratory: Negative for shortness of breath.   Cardiovascular: See HPI. Gastrointestinal: See HPI.  Musculoskeletal: Negative for back pain or muscle aches.  Neurological: Negative for dizziness, headaches or paresthesias.   Physical Exam: BP 120/80   Pulse (!) 55   Ht 5\' 4"  (1.626 m)   Wt 157 lb (71.2 kg)   SpO2 95%   BMI 26.95 kg/m  General: 58 year old female in no acute distress. Head:  Normocephalic and atraumatic. Eyes: No scleral icterus. Conjunctiva pink . Ears: Normal auditory acuity. Mouth: Dentition intact. No ulcers or lesions.  Lungs: Clear throughout to auscultation. Heart: Regular rate and rhythm, no murmur. Abdomen: Soft, nontender and nondistended. No masses or hepatomegaly. Normal bowel sounds x 4 quadrants.  Large RUQ scar intact. Rectal: Deferred. Musculoskeletal: Symmetrical with no gross deformities. Extremities: No edema. Neurological: Alert oriented x 4. No focal deficits.  Psychological: Alert and cooperative. Normal mood and affect  Assessment and Recommendations:  58 year old female s/p Roux-en-Y gastric bypass 2003 who was admitted  to the hospital 12/06/2023 with acute upper GI bleed with symptomatic anemia while on ASA and Brilinta . EGD 12/08/2023 which showed a normal esophagus, Roux-en-Y gastrojejunostomy with GJ anastomosis inflammation and friable mucosa and oozing jejunal ulcers with a nonbleeding vessel which was injected. Repeat EGD 12/10/2023 showed Roux-en-Y gastrojejunostomy with GJ anastomosis with 3 areas of ulceration, 2 clean-based, the largest with large adherent clot which was partially removed and treated with pure stat gel. She received a total of 4 blood transfusions. Treated with PPI twice daily and Carafate  qid x 2 weeks. ASA and Brilinta  were discontinued and she was started on Plavix .  Hg 9.1 at discharge. Anemia panel 01/15/2024 showed a hematocrit level 33.1, iron 40 and iron saturation 12%.  No further melena or hematochezia since she was discharged home. -Check CBC on 6/4 -Continue Pantoprazole  40 mg p.o. twice daily -Patient to contact our office if she develops any abdominal pain, black or bloody stools - I will consult with Dr. General Kenner to verify timing of repeat EGD to assess for ulcer healing. In setting of recent NSTEMI with 3 stents placed, may consider a diagnostic colonoscopy on Plavix   STEMI s/p coronary stent placement  x 3 on 11/22/2023 initially on ASA and Brilinta  which were discontinued secondary to upper GI bleed as noted above and she was subsequently switched to Plavix .  -Continue follow up with cardiology

## 2024-02-03 NOTE — Progress Notes (Signed)
 Jan, please contact patient and let her know Dr. General Kenner recommended scheduling an EGD in July or August on Plavix . Please schedule patient for an office follow up visit with me in July and at that time I can reassess appropriate timing of EGD and obtain an update regarding her cardiac status. THX.

## 2024-02-04 ENCOUNTER — Telehealth: Payer: Self-pay

## 2024-02-04 NOTE — Telephone Encounter (Signed)
-----   Message from Tory Freiberg sent at 02/03/2024  9:14 AM EDT -----    ----- Message ----- From: Ace Holder, MD Sent: 01/24/2024   3:18 PM EDT To: Tory Freiberg, NP    ----- Message ----- From: Tory Freiberg, NP Sent: 01/24/2024   3:03 PM EDT To: Ace Holder, MD  Dr. General Kenner, please verify the appropriate time to perform an EGD and in setting of MI with 3 stents 11/2023 on Plavix .  Not sure if you would do a diagnostic EGD to assess for ulcer healing as it is unlikely we can hold Plavix  for at least 6 months post MI but I can check with cardiology.  Let me know.  Thank you.

## 2024-02-04 NOTE — Telephone Encounter (Signed)
 Vanessa Wells - patient has been scheduled in July as requested

## 2024-02-04 NOTE — Telephone Encounter (Signed)
 Called patient but her VM is full and can't leave a message. Will send a MyChart message to patient with recommendations

## 2024-02-11 ENCOUNTER — Other Ambulatory Visit (HOSPITAL_COMMUNITY): Payer: Self-pay

## 2024-02-12 ENCOUNTER — Other Ambulatory Visit (INDEPENDENT_AMBULATORY_CARE_PROVIDER_SITE_OTHER)

## 2024-02-12 DIAGNOSIS — K259 Gastric ulcer, unspecified as acute or chronic, without hemorrhage or perforation: Secondary | ICD-10-CM

## 2024-02-12 DIAGNOSIS — K922 Gastrointestinal hemorrhage, unspecified: Secondary | ICD-10-CM | POA: Diagnosis not present

## 2024-02-12 LAB — CBC
HCT: 35.3 % — ABNORMAL LOW (ref 36.0–46.0)
Hemoglobin: 11.7 g/dL — ABNORMAL LOW (ref 12.0–15.0)
MCHC: 33.2 g/dL (ref 30.0–36.0)
MCV: 91.2 fl (ref 78.0–100.0)
Platelets: 273 10*3/uL (ref 150.0–400.0)
RBC: 3.87 Mil/uL (ref 3.87–5.11)
RDW: 13.9 % (ref 11.5–15.5)
WBC: 4.3 10*3/uL (ref 4.0–10.5)

## 2024-02-12 LAB — BASIC METABOLIC PANEL WITH GFR
BUN: 12 mg/dL (ref 6–23)
CO2: 28 meq/L (ref 19–32)
Calcium: 9.1 mg/dL (ref 8.4–10.5)
Chloride: 106 meq/L (ref 96–112)
Creatinine, Ser: 0.69 mg/dL (ref 0.40–1.20)
GFR: 97.4 mL/min (ref >60.00)
Glucose, Bld: 83 mg/dL (ref 70–99)
Potassium: 4.3 meq/L (ref 3.5–5.1)
Sodium: 140 meq/L (ref 135–145)

## 2024-02-12 LAB — IBC + FERRITIN
Ferritin: 41.5 ng/mL (ref 10.0–291.0)
Iron: 93 ug/dL (ref 42–145)
Saturation Ratios: 25.6 % (ref 20.0–50.0)
TIBC: 362.6 ug/dL (ref 250.0–450.0)
Transferrin: 259 mg/dL (ref 212.0–360.0)

## 2024-02-14 ENCOUNTER — Telehealth: Payer: Self-pay | Admitting: Cardiology

## 2024-02-14 MED ORDER — METOPROLOL SUCCINATE ER 50 MG PO TB24: 50.0000 mg | ORAL_TABLET | Freq: Every day | ORAL | 3 refills | Status: AC

## 2024-02-14 MED ORDER — ATORVASTATIN CALCIUM 80 MG PO TABS: 80.0000 mg | ORAL_TABLET | Freq: Every day | ORAL | 3 refills | Status: AC

## 2024-02-14 NOTE — Telephone Encounter (Signed)
*  STAT* If patient is at the pharmacy, call can be transferred to refill team.   1. Which medications need to be refilled? (please list name of each medication and dose if known)  atorvastatin  (LIPITOR) 80 MG tablet metoprolol  succinate (TOPROL -XL) 50 MG 24 hr tablet  2. Which pharmacy/location (including street and city if local pharmacy) is medication to be sent to? [id number] - [place], [place] - [address] AT [address] [facility] [address]    3. Do they need a 30 day or 90 day supply?  90 day supply

## 2024-02-19 ENCOUNTER — Ambulatory Visit: Payer: Self-pay | Admitting: Nurse Practitioner

## 2024-03-18 ENCOUNTER — Other Ambulatory Visit: Payer: Self-pay

## 2024-03-18 NOTE — Telephone Encounter (Signed)
 [facility] pharmacy is requesting a refill on medication pantoprazole . Would Dr. Ganji like to refill this medication? Please address

## 2024-03-18 NOTE — Telephone Encounter (Signed)
 Please send to patient's gastroenterologist

## 2024-03-19 ENCOUNTER — Other Ambulatory Visit: Payer: Self-pay

## 2024-03-19 MED ORDER — PANTOPRAZOLE SODIUM 40 MG PO TBEC: 40.0000 mg | DELAYED_RELEASE_TABLET | Freq: Two times a day (BID) | ORAL | 0 refills | Status: DC

## 2024-03-19 NOTE — Telephone Encounter (Signed)
 Please send to patient's gastroenterologist

## 2024-03-19 NOTE — Telephone Encounter (Signed)
 Pt's pharmacy is requesting a refill on medication pantoprazole . This medication was prescribed in the hospital. Would Dr. Ladona like to refill this medication? Please address

## 2024-03-26 ENCOUNTER — Other Ambulatory Visit (INDEPENDENT_AMBULATORY_CARE_PROVIDER_SITE_OTHER)

## 2024-03-26 ENCOUNTER — Ambulatory Visit (INDEPENDENT_AMBULATORY_CARE_PROVIDER_SITE_OTHER): Admitting: Nurse Practitioner

## 2024-03-26 ENCOUNTER — Encounter: Payer: Self-pay | Admitting: Nurse Practitioner

## 2024-03-26 VITALS — BP 118/68 | HR 60 | Ht 64.0 in | Wt 171.0 lb

## 2024-03-26 DIAGNOSIS — D649 Anemia, unspecified: Secondary | ICD-10-CM

## 2024-03-26 DIAGNOSIS — K289 Gastrojejunal ulcer, unspecified as acute or chronic, without hemorrhage or perforation: Secondary | ICD-10-CM

## 2024-03-26 DIAGNOSIS — K922 Gastrointestinal hemorrhage, unspecified: Secondary | ICD-10-CM

## 2024-03-26 DIAGNOSIS — Z7902 Long term (current) use of antithrombotics/antiplatelets: Secondary | ICD-10-CM

## 2024-03-26 LAB — CBC
HCT: 37 % (ref 36.0–46.0)
Hemoglobin: 12.2 g/dL (ref 12.0–15.0)
MCHC: 32.9 g/dL (ref 30.0–36.0)
MCV: 89.1 fl (ref 78.0–100.0)
Platelets: 293 K/uL (ref 150.0–400.0)
RBC: 4.16 Mil/uL (ref 3.87–5.11)
RDW: 14.2 % (ref 11.5–15.5)
WBC: 4.7 K/uL (ref 4.0–10.5)

## 2024-03-26 NOTE — Progress Notes (Addendum)
 03/26/2024 Vanessa Wells [id number] 11/22/1965   Chief Complaint: Discuss scheduling an upper endoscopy  History of Present Illness: Vanessa Wells is a 58 year old female with a past medical history of anxiety, hypertension, hypercholesterolemia, coronary artery disease s/p STEMI with 3 coronary stents 11/2023, obesity, diabetes mellitus type II. S/P Roux-en-Y gastric bypass 2003.   As previously reviewed, she was admitted to the hospital 12/06/2023 with symptomatic anemia and upper GI bleed/ melena. Admission Hg 7.0 (Hg 14.4 on 11/19/2013). She underwent an EGD 12/08/2023 which showed a normal esophagus, Roux-en-Y gastrojejunostomy with GJ anastomosis inflammation and friable mucosa and oozing jejunal ulcers with a nonbleeding vessel which was injected. She had recurrent active GI bleeding and underwent a repeat EGD 12/10/2023 which showed Roux-en-Y gastrojejunostomy with GJ anastomosis with 3 areas of ulceration, 2 clean-based, the largest with large adherent clot which was partially removed and treated with pure stat gel. She received a total of 4 blood transfusions during this admission. She was treated with PPI twice daily and Carafate . Her clinical data stabilized and she was discharged home 12/16/2023 on Pantoprazole  40 mg twice daily, Sucralfate  1 g 4 times daily x 2 weeks.  Brilinta  was discontinued and she was started on Plavix . Hg level 9.1 at time of discharge.    I saw her for follow-up 01/24/2024 and at that time she endorsed feeling well without having any nausea or vomiting.  No GERD symptoms or abdominal pain.  Bowel movements were normal without melena or rectal bleeding. She completed the prescribed course of Carafate  and she remained  adherent with taking Pantoprazole  40 mg twice daily.  Anemia panel 01/15/2024 showed a hematocrit level 33.1, iron 40, iron saturation 12%, vitamin B12 374 and TIBC 323. She was advised to follow up in 2 months to schedule a follow up EGD on Plavix  to  assess for mucosal healing.   She presents today to schedule follow-up EGD.  She endorses feeling quite well.  She denies having any nausea or vomiting.  No heartburn or dysphagia.  She ran out of pantoprazole  for 2 to 3 days 1 week ago which resulted in mild stomach discomfort for 2 to 3 days which abated when she received her prescription.  She remains on omeprazole 40 mg twice daily.  She is passing a normal brown formed stool most days, sometimes feels a little constipated no bloody or black stools.  No chest pain but noted feeling a brief flutter in her chest every once in a while without associated dizziness or shortness of breath. She was last seen by her cardiologist Dr. Ladona 01/15/2024, her cardiac status was stable at that time on Plavix , Metoprolol  and Atorvastatin  and she was instructed to follow-up in 6 months. She has gained 10 pounds over the past 2 months. Follow-up labs 02/12/2024 showed a hemoglobin level of 11.7.  Hematocrit 35.3.  Iron 93.  Ferritin 41.5.  No NSAID use since she was discharged from the hospital 12/2023.     Latest Ref Rng & Units 02/12/2024   11:07 AM 01/15/2024    3:13 PM 12/16/2023    3:22 AM  CBC  WBC 4.0 - 10.5 K/uL 4.3   7.3   Hemoglobin 12.0 - 15.0 g/dL 88.2   9.1   Hematocrit 36.0 - 46.0 % 35.3  33.1  27.8   Platelets 150.0 - 400.0 K/uL 273.0   375        Latest Ref Rng & Units 02/12/2024   11:07 AM 12/15/2023  6:37 AM 12/14/2023    5:42 AM  CMP  Glucose 70 - 99 mg/dL 83  84  85   BUN 6 - 23 mg/dL 12  7  6    Creatinine 0.40 - 1.20 mg/dL 9.30  9.20  9.32   Sodium 135 - 145 mEq/L 140  143  144   Potassium 3.5 - 5.1 mEq/L 4.3  3.5  3.2   Chloride 96 - 112 mEq/L 106  109  110   CO2 19 - 32 mEq/L 28  28  23    Calcium  8.4 - 10.5 mg/dL 9.1  8.3  8.3      Echocardiogram 11/23/2023: There is mild concentric left ventricular hypertrophy.  The left ventricular cavity is small.  Global LV systolic function is preserved  Severe apical and mild mid anterior and  anteroseptal hypokinesis.  LV ejection fraction = 55-60%. Left ventricular filling pattern is indeterminate.  The right ventricle is normal in size and function.  There is mild to moderate tricuspid regurgitation.  Mild to moderate pulmonary hypertension.                                 GI Procedures:   EGD 12/10/2023 by Dr. Leigh as an inpatient for GI bleed - Z-line regular.  - Normal esophagus otherwise  - Roux-en-Y gastrojejunostomy with gastrojejunal anastomosis characterized by 3 areas of ulceration, 2 clean based, the largest with large adherent clot which was partially removed as outlined and treated with Purastat gel.  - Normal examined jejunal limb   EGD 12/08/2023 by Dr. Rollin as an inpatient for GI bleed - Normal esophagus.  - Roux-en-Y gastrojejunostomy with gastrojejunal anastomosis characterized by friable mucosa and inflammation.  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 Injected.  - No specimens collected.   Colonoscopy 02/2022 at [facility]  Normal colonoscopy 10-year recall.  Current Outpatient Medications on File Prior to Visit  Medication Sig Dispense Refill   atorvastatin  (LIPITOR) 80 MG tablet Take 1 tablet (80 mg total) by mouth daily. 90 tablet 3   Cholecalciferol (TRUE VITAMIN D3) 1.25 MG (50000 UT) TABS Take 50,000 Units by mouth every Sunday.     clopidogrel  (PLAVIX ) 75 MG tablet Take 1 tablet (75 mg total) by mouth daily. 90 tablet 3   cyanocobalamin (VITAMIN B12) 1000 MCG tablet Take 1 tablet (1,000 mcg total) by mouth daily.     metoprolol  succinate (TOPROL -XL) 50 MG 24 hr tablet Take 1 tablet (50 mg total) by mouth daily. 90 tablet 3   nitroGLYCERIN  (NITROSTAT ) 0.4 MG SL tablet Place 1 tablet (0.4 mg total) under the tongue every 5 (five) minutes as needed. 25 tablet 1   pantoprazole  (PROTONIX ) 40 MG tablet Take 1 tablet (40 mg total) by mouth 2 (two) times daily. 60 tablet 0   sertraline  (ZOLOFT )  100 MG tablet Take 100 mg by mouth daily.     No current facility-administered medications on file prior to visit.   Allergies  Allergen Reactions   Niaspan [Niacin] Hives and Swelling    Burning sensation, passed out   Current Medications, Allergies, Past Medical History, Past Surgical History, Family History and Social History were reviewed in [employer] record.   Review of Systems:   Constitutional: Negative for fever, sweats, chills or weight loss.  Respiratory: Negative for shortness of breath.   Cardiovascular: Negative for chest pain, palpitations and  leg swelling.  Gastrointestinal: See HPI.  Musculoskeletal: Negative for back pain or muscle aches.  Neurological: Negative for dizziness, headaches or paresthesias.    Physical Exam: BP 118/68   Pulse 60   Ht 5' 4 (1.626 m)   Wt 171 lb (77.6 kg)   BMI 29.35 kg/m   Wt Readings from Last 3 Encounters:  03/26/24 171 lb (77.6 kg)  01/24/24 157 lb (71.2 kg)  01/15/24 161 lb 3.2 oz (73.1 kg)    General: 58 year-old female in no acute distress. Head: Normocephalic and atraumatic. Eyes: No scleral icterus. Conjunctiva pink . Ears: Normal auditory acuity. Mouth: Dentition intact. No ulcers or lesions.  Lungs: Clear throughout to auscultation. Heart: Regular rate and rhythm, no murmur. Abdomen: Soft, nontender and nondistended. No masses or hepatomegaly. Normal bowel sounds x 4 quadrants.  Rectal: Deferred. Musculoskeletal: Symmetrical with no gross deformities. Extremities: No edema. Neurological: Alert oriented x 4. No focal deficits.  Psychological: Alert and cooperative. Normal mood and affect  Assessment and Recommendations:   58 year old female s/p Roux-en-Y gastric bypass 2003 who was admitted to the hospital 12/06/2023 with acute upper GI bleed with symptomatic anemia while on ASA and Brilinta . EGD 12/08/2023 which showed a normal esophagus, Roux-en-Y gastrojejunostomy with GJ anastomosis  inflammation and friable mucosa and oozing jejunal ulcers with a nonbleeding vessel which was injected. Repeat EGD 12/10/2023 showed Roux-en-Y gastrojejunostomy with GJ anastomosis with 3 areas of ulceration, 2 clean-based, the largest with large adherent clot which was partially removed and treated with pure stat gel.  Transfused 4 units of PRBCs. Treated with continuous PPI twice daily and Carafate  qid x 2 weeks. ASA and Brilinta  were discontinued and she was started on Plavix .  Hg 9.1 at discharge. Anemia panel 01/15/2024 showed a hematocrit level 33.1, iron 40 and iron saturation 12%.  Hg 11.7 on 02/12/2024. Normal iron panel. Not on oral iron. No further melena or hematochezia since hospital discharge 12/2023. She ran out of Pantoprazole  for a few days 1 week ago which resulted in stomach discomfort which abated when she restarted Pantoprazole . -EGD on Plavix  to assess for GJ anastomosis mucosal healing and jejunal ulcer healing benefits and risks discussed including risk with sedation, risk of bleeding, perforation and infection  -CBC -Cardiac clearance requested prior to the patient proceeding with an EGD -Patient instructed to continue pantoprazole  40 mg twice daily without interruption -Weight loss recommended, patient has gained 10 pounds in the past 2 months -Patient to contact our office if she develops any GERD symptoms, upper abdominal pain, black or bloody stools  S/P STEMI s/p coronary stent placement x 3 on 11/22/2023 initially on ASA and Brilinta  which were discontinued secondary to upper GI bleed as noted above and she was subsequently switched to Plavix . On Metoprolol  and Atorvastatin . - Cardiac clearance with Dr. Ladona prior to proceeding with an EGD on Plavix  as  ordered above - Continue routine cardiac follow-up  Colon cancer screening.  Colonoscopy 02/2022 was normal. - Next screening colonoscopy due 02/2032  ADDENDUM: Cardiac clearance received 05/01/2024 per Rosaline Bane NP as  follows:  Preoperative Cardiovascular Risk Assessment: According to the Revised Cardiac Risk Index (RCRI), her Perioperative Risk of Major Cardiac Event is (%): 0.9. Her Functional Capacity in METs is: 6.7 according to the Duke Activity Status Index (DASI). The patient is doing well from a cardiac perspective. Therefore, based on ACC/AHA guidelines, the patient would be at acceptable risk for the planned procedure without further cardiovascular testing.  The patient was advised that if she develops new symptoms prior to surgery to contact our office to arrange for a follow-up visit, and she verbalized understanding.  ADDENDUM: Norleen Schillings CRNA reviewed case 05/05/2024, ok to proceed with EGD on Plavix  as scheduled 05/13/2024 in [facility]

## 2024-03-26 NOTE — Patient Instructions (Addendum)
 Your provider has requested that you go to the basement level for lab work before leaving today. Press B on the elevator. The lab is located at the first door on the left as you exit the elevator.   You have been scheduled for an endoscopy. Please follow written instructions given to you at your visit today.  If you use inhalers (even only as needed), please bring them with you on the day of your procedure.  If you take any of the following medications, they will need to be adjusted prior to your procedure:   DO NOT TAKE 7 DAYS PRIOR TO TEST- Trulicity (dulaglutide) Ozempic, Wegovy (semaglutide) Mounjaro (tirzepatide) Bydureon Bcise (exanatide extended release)  DO NOT TAKE 1 DAY PRIOR TO YOUR TEST Rybelsus (semaglutide) Adlyxin (lixisenatide) Victoza (liraglutide) Byetta (exanatide) ___________________________________________________________________________  _______________________________________________________  If your blood pressure at your visit was 140/90 or greater, please contact your primary care physician to follow up on this.  _______________________________________________________  If you are age 16 or older, your body mass index should be between 23-30. Your Body mass index is 29.35 kg/m. If this is out of the aforementioned range listed, please consider follow up with your Primary Care Provider.  If you are age 69 or younger, your body mass index should be between 19-25. Your Body mass index is 29.35 kg/m. If this is out of the aformentioned range listed, please consider follow up with your Primary Care Provider.   ________________________________________________________  The [facility] providers would like to encourage you to use MYCHART to communicate with providers for non-urgent requests or questions.  Due to long hold times on the telephone, sending your provider a message by [facility] may be a faster and more efficient way to get a response.  Please allow 48  business hours for a response.  Please remember that this is for non-urgent requests.  _______________________________________________________   Thank you for trusting me with your gastrointestinal care!   Elida Shawl, CRNP

## 2024-03-27 ENCOUNTER — Encounter: Payer: Self-pay | Admitting: Nurse Practitioner

## 2024-03-27 ENCOUNTER — Telehealth: Payer: Self-pay

## 2024-03-27 NOTE — Telephone Encounter (Signed)
 Called patient to schedule tele appointment, NA, no VM box to leave a message.

## 2024-03-27 NOTE — Telephone Encounter (Signed)
 1st attempt to reach pt regarding surgical clearance and the need for an TELE appointment.  Stated call unable to be completed at this time after ringing for a while. Unable to LVM

## 2024-03-27 NOTE — Telephone Encounter (Signed)
 [facility] Pre-operative Risk Assessment     Request for surgical clearance:     Endoscopy Procedure  What type of surgery is being performed?     Endoscopy  When is this surgery scheduled?     05/13/24  What type of clearance is required ?   Cardiac  Are there any medications that need to be held prior to surgery and how long? No hold for Plavix . EGD is diagnostic. Patient is needing cardiac clearance.  Practice name and name of physician performing surgery?      [facility]  What is your office phone and fax number?      Phone- [phone]  Fax- [phone]  Anesthesia type (None, local, MAC, general) ?       MAC   Please route your response to [facility]

## 2024-03-27 NOTE — Telephone Encounter (Signed)
   Name: Vanessa Wells  DOB: 04/17/1966  MRN: [id number]  Primary Cardiologist: Gordy Bergamo, MD   Preoperative team, please contact this patient and set up a phone call appointment for further preoperative risk assessment. Please obtain consent and complete medication review. Thank you for your help.  I confirm that guidance regarding antiplatelet and oral anticoagulation therapy has been completed and, if necessary, noted below.  Per clearance request No hold for Plavix . EGD is diagnostic. Patient is needing cardiac clearance. However, Per office protocol, if patient is without any new symptoms or concerns at the time of their virtual visit, he/she may hold Plavix  for 5 days prior to procedure if needed. Would need to resume Plavix  as soon as possible postprocedure, at the discretion of the surgeon. We would recommend patient take Aspirin  81 mg daily throughout the perioperative period.  Please discontinue Aspirin  upon resumption of Plavix .   I also confirmed the patient resides in the state of [place] . As per [facility] Medical Board telemedicine laws, the patient must reside in the state in which the provider is licensed.   Artist Pouch, PA-C 03/27/2024, 8:47 AM [facility]

## 2024-03-28 NOTE — Progress Notes (Signed)
 Agree with assessment and plan as outlined. She should remain on protonix  BID for now. Agree with EGD if she is cleared by cardiology. We need to determine if this should be done in the hospital vs. [facility] based on timing post stenting.

## 2024-03-29 ENCOUNTER — Ambulatory Visit: Payer: Self-pay | Admitting: Nurse Practitioner

## 2024-03-29 NOTE — Progress Notes (Signed)
 Dr. Leigh, patient is tentatively scheduled for an EGD on Plavix  with you in [facility] on 05/13/2024. I will keep track of cardiac clearance, looks like cardiology has attempted to call patient to set up appt. She was instructed to continue Pantoprazole  bid at the time of this office visit.  I will let you know once we have receive cardiac clearance which was previously requested. Do you want Dr. Ladona to also verify if ok to proceed with EGD in [facility] or at [facility]?

## 2024-03-29 NOTE — Progress Notes (Signed)
 Thanks Mobeetie - I think if she is > 6 months post PCI and we keep her on Plavix , should be okay to do at the [facility] if she is otherwise feeling well. Her EF looked good. Thanks

## 2024-03-30 NOTE — Telephone Encounter (Signed)
 2nd attempt to schedule pt for TELE Preop appt. Rings then state unable to complete call at this time try again later. Unable to leave voicemail.

## 2024-03-31 NOTE — Telephone Encounter (Signed)
 3rrd attempt to reach pt regarding surgical clearance and the need for an TELE appointment.  Stated call unable to be completed at this time after ringing for a while. Unable to LVM   Will update surgeon's office and remove from preop pool

## 2024-04-02 NOTE — Telephone Encounter (Signed)
 Patient returned call and stated that she would contact Cardiology to schedule an appointment.

## 2024-04-02 NOTE — Telephone Encounter (Signed)
 Attempted to contact patient regarding needing an appointment with Cardiology for cardiac clearance. Patient did not answer and I was not able to leave a VM.

## 2024-04-06 ENCOUNTER — Telehealth: Payer: Self-pay | Admitting: *Deleted

## 2024-04-06 ENCOUNTER — Telehealth: Payer: Self-pay | Admitting: Cardiology

## 2024-04-06 NOTE — Telephone Encounter (Signed)
 See previous notes that our office attempted x 3 to reach the pt to schedule tele preop appt.

## 2024-04-06 NOTE — Telephone Encounter (Signed)
 Operator sent me a secure chat the pt was calling back to schedule tele preop appt.  I told operator I will have to call the pt back as I am with another pt.

## 2024-04-06 NOTE — Telephone Encounter (Signed)
 Pt has been scheduled tele preop appt 05/01/24. Med rec and consent are done.     Patient Consent for Virtual Visit        HENCHY MCCAULEY has provided verbal consent on 04/06/2024 for a virtual visit (video or telephone).   CONSENT FOR VIRTUAL VISIT FOR:  [name]  By participating in this virtual visit I agree to the following:  I hereby voluntarily request, consent and authorize [facility] and its employed or contracted physicians, physician assistants, nurse practitioners or other licensed health care professionals (the Practitioner), to provide me with telemedicine health care services (the "Services) as deemed necessary by the treating Practitioner. I acknowledge and consent to receive the Services by the Practitioner via telemedicine. I understand that the telemedicine visit will involve communicating with the Practitioner through live audiovisual communication technology and the disclosure of certain medical information by electronic transmission. I acknowledge that I have been given the opportunity to request an in-person assessment or other available alternative prior to the telemedicine visit and am voluntarily participating in the telemedicine visit.  I understand that I have the right to withhold or withdraw my consent to the use of telemedicine in the course of my care at any time, without affecting my right to future care or treatment, and that the Practitioner or I may terminate the telemedicine visit at any time. I understand that I have the right to inspect all information obtained and/or recorded in the course of the telemedicine visit and may receive copies of available information for a reasonable fee.  I understand that some of the potential risks of receiving the Services via telemedicine include:  Delay or interruption in medical evaluation due to technological equipment failure or disruption; Information transmitted may not be sufficient (e.g. poor  resolution of images) to allow for appropriate medical decision making by the Practitioner; and/or  In rare instances, security protocols could fail, causing a breach of personal health information.  Furthermore, I acknowledge that it is my responsibility to provide information about my medical history, conditions and care that is complete and accurate to the best of my ability. I acknowledge that Practitioner's advice, recommendations, and/or decision may be based on factors not within their control, such as incomplete or inaccurate data provided by me or distortions of diagnostic images or specimens that may result from electronic transmissions. I understand that the practice of medicine is not an exact science and that Practitioner makes no warranties or guarantees regarding treatment outcomes. I acknowledge that a copy of this consent can be made available to me via my patient portal [facility] MyChart), or I can request a printed copy by calling the office of [facility].    I understand that my insurance will be billed for this visit.   I have read or had this consent read to me. I understand the contents of this consent, which adequately explains the benefits and risks of the Services being provided via telemedicine.  I have been provided ample opportunity to ask questions regarding this consent and the Services and have had my questions answered to my satisfaction. I give my informed consent for the services to be provided through the use of telemedicine in my medical care

## 2024-04-06 NOTE — Telephone Encounter (Signed)
 Vanessa Wells BG   04/06/24 12:16 PM Note Pt returning call to schedule Tele visit for pre-op       04/06/24 12:15 PM Vanessa Wells HERO contacted Vanessa Wells

## 2024-04-06 NOTE — Telephone Encounter (Signed)
 Pt has been scheduled tele preop appt 05/01/24. Med rec and consent are done.

## 2024-04-06 NOTE — Telephone Encounter (Signed)
 Pt returning call to schedule Tele visit for pre-op

## 2024-04-16 MED ORDER — PANTOPRAZOLE SODIUM 40 MG PO TBEC
40.0000 mg | DELAYED_RELEASE_TABLET | Freq: Two times a day (BID) | ORAL | 2 refills | Status: DC
Start: 2024-04-16 — End: 2024-07-26

## 2024-04-16 NOTE — Addendum Note (Signed)
 Addended by: CLAUDENE NAOMIE SAILOR on: 04/16/2024 04:10 PM   Modules accepted: Orders

## 2024-05-01 ENCOUNTER — Other Ambulatory Visit: Payer: Self-pay | Admitting: Cardiology

## 2024-05-01 ENCOUNTER — Ambulatory Visit: Attending: Internal Medicine | Admitting: Nurse Practitioner

## 2024-05-01 ENCOUNTER — Encounter: Payer: Self-pay | Admitting: Nurse Practitioner

## 2024-05-01 DIAGNOSIS — I2510 Atherosclerotic heart disease of native coronary artery without angina pectoris: Secondary | ICD-10-CM

## 2024-05-01 DIAGNOSIS — Z01810 Encounter for preprocedural cardiovascular examination: Secondary | ICD-10-CM | POA: Diagnosis not present

## 2024-05-01 NOTE — Progress Notes (Signed)
 ICD-10-CM   1. CAD S/P percutaneous coronary angioplasty  I25.10 AMB referral to cardiac rehabilitation   Z98.61      Orders Placed This Encounter  Procedures   AMB referral to cardiac rehabilitation    Referral Priority:   Routine    Referral Type:   Consultation    Number of Visits Requested:   1

## 2024-05-01 NOTE — Progress Notes (Signed)
 Virtual Visit via Telephone Note   Because of [name] co-morbid illnesses, she is at least at moderate risk for complications without adequate follow up.  This format is felt to be most appropriate for this patient at this time.  Due to technical limitations with video connection (technology), today's appointment will be conducted as an audio only telehealth visit, and [name] verbally agreed to proceed in this manner.   All issues noted in this document were discussed and addressed.  No physical exam could be performed with this format.  Evaluation Performed:  Preoperative cardiovascular risk assessment _____________   Date:  05/01/2024   Patient ID:  Vanessa Wells, DOB 10/31/1965, MRN [id number] Patient Location:  Home Provider location:   Office  Primary Care Provider:  Benjamine Aland, MD Primary Cardiologist:  Gordy Bergamo, MD  Chief Complaint / Patient Profile   58 y.o. y/o female with a h/o STEMI 11/22/23 with stenting x 2 in LAD, GIB, anemia, gastric bypass surgery with 200 lb weight loss, HLD, HTN who is pending endoscopy and presents today for telephonic preoperative cardiovascular risk assessment.  History of Present Illness    Vanessa Wells is a 58 y.o. female who presents via audio/video conferencing for a telehealth visit today.  Pt was last seen in cardiology clinic on 01/15/24 by Dr. Bergamo.  At that time [name] was doing well.  The patient is now pending procedure as outlined above. Since her last visit, she  denies chest pain, shortness of breath, lower extremity edema, fatigue, palpitations, melena, hematuria, hemoptysis, diaphoresis, weakness, presyncope, syncope, orthopnea, and PND. She admits she is not as active as she should be but does not have any limitations to achieving > 4 METS activity without concerning cardiac symptoms.   Past Medical History    Past Medical History:  Diagnosis Date   Anxiety    Diabetes mellitus     borderline   Hypercholesterolemia    Hypertension    Obesity    Past Surgical History:  Procedure Laterality Date   ABDOMINAL HYSTERECTOMY     CHOLECYSTECTOMY     ESOPHAGOGASTRODUODENOSCOPY N/A 12/08/2023   Procedure: EGD (ESOPHAGOGASTRODUODENOSCOPY);  Surgeon: Rollin Dover, MD;  Location: [facility] ENDOSCOPY;  Service: Gastroenterology;  Laterality: N/A;   ESOPHAGOGASTRODUODENOSCOPY N/A 12/10/2023   Procedure: EGD (ESOPHAGOGASTRODUODENOSCOPY);  Surgeon: Leigh Elspeth SQUIBB, MD;  Location: [facility] ENDOSCOPY;  Service: Gastroenterology;  Laterality: N/A;   GASTRIC BYPASS     HEMOSTASIS CLIP PLACEMENT  12/08/2023   Procedure: CONTROL OF HEMORRHAGE, GI TRACT, ENDOSCOPIC, BY CLIPPING OR OVERSEWING;  Surgeon: Rollin Dover, MD;  Location: [facility] ENDOSCOPY;  Service: Gastroenterology;;  PURASTATx2   HEMOSTASIS CLIP PLACEMENT  12/10/2023   Procedure: CONTROL OF HEMORRHAGE, GI TRACT, ENDOSCOPIC, BY CLIPPING OR OVERSEWING;  Surgeon: Leigh Elspeth SQUIBB, MD;  Location: [facility] ENDOSCOPY;  Service: Gastroenterology;;    Allergies  Allergies  Allergen Reactions   Niaspan [Niacin] Hives and Swelling    Burning sensation, passed out    Home Medications    Prior to Admission medications   Medication Sig Start Date End Date Taking? Authorizing Provider  atorvastatin  (LIPITOR) 80 MG tablet Take 1 tablet (80 mg total) by mouth daily. 02/14/24   Ganji, Jay, MD  Cholecalciferol (TRUE VITAMIN D3) 1.25 MG (50000 UT) TABS Take 50,000 Units by mouth every Sunday.    [provider]  clopidogrel  (PLAVIX ) 75 MG tablet Take 1 tablet (75 mg total) by mouth daily. 01/15/24   Bergamo Gordy, MD  cyanocobalamin (VITAMIN B12) 1000 MCG tablet Take 1 tablet (1,000 mcg total) by mouth daily. 01/21/24   Ladona Heinz, MD  metoprolol  succinate (TOPROL -XL) 50 MG 24 hr tablet Take 1 tablet (50 mg total) by mouth daily. 02/14/24   Ladona Heinz, MD  nitroGLYCERIN  (NITROSTAT ) 0.4 MG SL tablet Place 1 tablet (0.4 mg total) under the tongue every 5  (five) minutes as needed. 01/15/24 04/14/24  Ladona Heinz, MD  pantoprazole  (PROTONIX ) 40 MG tablet Take 1 tablet (40 mg total) by mouth 2 (two) times daily. 04/16/24   Kennedy-Smith, Colleen M, NP  sertraline  (ZOLOFT ) 100 MG tablet Take 100 mg by mouth daily.    [provider]    Physical Exam    Vital Signs:  Vanessa Wells does not have vital signs available for review today.  Given telephonic nature of communication, physical exam is limited. AAOx3. NAD. Normal affect.  Speech and respirations are unlabored.  Accessory Clinical Findings    None  Assessment & Plan    1.  Preoperative Cardiovascular Risk Assessment: According to the Revised Cardiac Risk Index (RCRI), her Perioperative Risk of Major Cardiac Event is (%): 0.9. Her Functional Capacity in METs is: 6.7 according to the Duke Activity Status Index (DASI). The patient is doing well from a cardiac perspective. Therefore, based on ACC/AHA guidelines, the patient would be at acceptable risk for the planned procedure without further cardiovascular testing.    The patient was advised that if she develops new symptoms prior to surgery to contact our office to arrange for a follow-up visit, and she verbalized understanding.  Per requesting provider, patient will continue Plavix  throughout the perioperative period.  A copy of this note will be routed to requesting surgeon.  Time:   Today, I have spent 10 minutes with the patient with telehealth technology discussing medical history, symptoms, and management plan.     [name], NP-C  05/01/2024, 9:15 AM [address], [address] [place], [place] [address] Office [phone] Fax [phone]

## 2024-05-01 NOTE — Telephone Encounter (Signed)
 See cardiology telephone note from 05/01/24. Patient has been cleared to have EGD in the [facility] while on Plavix  for a diagnostic procedure. Patient is aware.

## 2024-05-04 ENCOUNTER — Encounter: Payer: Self-pay | Admitting: Gastroenterology

## 2024-05-04 ENCOUNTER — Telehealth: Payer: Self-pay | Admitting: Nurse Practitioner

## 2024-05-04 NOTE — Telephone Encounter (Signed)
 Dr. Leigh, refer to office visit 03/26/2024. Pls review cardiac clearance 05/01/2024 per Rosaline Bane NP as follows: 1.  Preoperative Cardiovascular Risk Assessment: According to the Revised Cardiac Risk Index (RCRI), her Perioperative Risk of Major Cardiac Event is (%): 0.9. Her Functional Capacity in METs is: 6.7 according to the Duke Activity Status Index (DASI). The patient is doing well from a cardiac perspective. Therefore, based on ACC/AHA guidelines, the patient would be at acceptable risk for the planned procedure without further cardiovascular testing.      The patient was advised that if she develops new symptoms prior to surgery to contact our office to arrange for a follow-up visit, and she verbalized understanding.  Dr. Leigh pls verify if patient ok to proceed with EGD on Plavix  as scheduled  05/13/2024 in [facility] or do you want her to reschedule at [facility]?

## 2024-05-04 NOTE — Telephone Encounter (Signed)
 Thanks [employer].  EF was normal, I think okay to do at the [facility], she will be just about 6 months out from her stent placement and can do on Plavix .  Adding Norleen Schillings here to confirm. John - just making sure okay with you to do at the [facility], this patient will have an EGD ON Plavix , history of CAD, had intervention in March. Thanks

## 2024-05-05 ENCOUNTER — Telehealth (HOSPITAL_COMMUNITY): Payer: Self-pay

## 2024-05-05 NOTE — Telephone Encounter (Signed)
 Called and spoke to patient.  She is comfortable and would like to proceed with EGD procedure on 9-3 to arrive at 2:30pm. All questions answered.

## 2024-05-05 NOTE — Telephone Encounter (Signed)
 Called patient to see if she was interested in participating in the Cardiac Rehab Program. Patient will come in for orientation on 9/16@1045  and will attend the 1015 exercise class.   Sent package.

## 2024-05-05 NOTE — Telephone Encounter (Signed)
Doristine Devoid, thank you Jan

## 2024-05-05 NOTE — Telephone Encounter (Signed)
 Pt insurance is active and benefits verified through [employer] Co-pay 0, DED $1,500/$1,500 met, out of pocket $5,900/$5,900 met, co-insurance 30%. no pre-authorization required, [employer] 05/05/2024@10 :19, REF# [id number]   TCR/ICR? ICR Visit(date of service)limitation? 72 per 3 years Can multiple codes be used on the same date of service/visit?(IF ITS A LIMIT) yes    Is this a lifetime maximum or an annual maximum? annual Has the member used any of these services to date? no Is there a time limit (weeks/months) on start of program and/or program completion? no     Will contact patient to see if she is interested in the Cardiac Rehab Program. If interested, patient will need to complete follow up appt. Once completed, patient will be contacted for scheduling upon review by the RN Navigator.

## 2024-05-05 NOTE — Telephone Encounter (Signed)
 Thank you Norleen, appreciate it.  Jan can you confirm with the patient she is okay to proceed with EGD as scheduled in the [facility] Thanks

## 2024-05-13 ENCOUNTER — Encounter: Payer: Self-pay | Admitting: Gastroenterology

## 2024-05-13 ENCOUNTER — Ambulatory Visit (AMBULATORY_SURGERY_CENTER): Admitting: Gastroenterology

## 2024-05-13 VITALS — BP 122/60 | HR 53 | Temp 97.9°F | Resp 14 | Ht 64.0 in | Wt 171.0 lb

## 2024-05-13 DIAGNOSIS — Z980 Intestinal bypass and anastomosis status: Secondary | ICD-10-CM

## 2024-05-13 DIAGNOSIS — K449 Diaphragmatic hernia without obstruction or gangrene: Secondary | ICD-10-CM

## 2024-05-13 DIAGNOSIS — Z7902 Long term (current) use of antithrombotics/antiplatelets: Secondary | ICD-10-CM

## 2024-05-13 DIAGNOSIS — K2289 Other specified disease of esophagus: Secondary | ICD-10-CM

## 2024-05-13 DIAGNOSIS — K289 Gastrojejunal ulcer, unspecified as acute or chronic, without hemorrhage or perforation: Secondary | ICD-10-CM

## 2024-05-13 MED ORDER — SODIUM CHLORIDE 0.9 % IV SOLN: 500.0000 mL | Freq: Once | INTRAVENOUS | Status: DC

## 2024-05-13 NOTE — Patient Instructions (Signed)
 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  If the procedure report does not answer your questions, please call your gastroenterologist to clarify.  If you requested that your care partner not be given the details of your procedure findings, then the procedure report has been included in a sealed envelope for you to review at your convenience later.  YOU SHOULD EXPECT: Some feelings of bloating in the abdomen. Passage of more gas than usual.  Walking can help get rid of the air that was put into your GI tract during the procedure and reduce the bloating. If you had a lower endoscopy (such as a colonoscopy or flexible sigmoidoscopy) you may notice spotting of blood in your stool or on the toilet paper. If you underwent a bowel prep for your procedure, you may not have a normal bowel movement for a few days.  Please Note:  You might notice some irritation and congestion in your nose or some drainage.  This is from the oxygen used during your procedure.  There is no need for concern and it should clear up in a day or so.  SYMPTOMS TO REPORT IMMEDIATELY:  Following upper endoscopy (EGD)  Vomiting of blood or coffee ground material  New chest pain or pain under the shoulder blades  Painful or persistently difficult swallowing  New shortness of breath  Fever of 100F or higher  Black, tarry-looking stools  For urgent or emergent issues, a gastroenterologist can be reached at any hour by calling [phone]. Do not use MyChart messaging for urgent concerns.    DIET:  We do recommend a small meal at first, but then you may proceed to your  regular diet.  Drink plenty of fluids but you should avoid alcoholic beverages for 24 hours.  ACTIVITY:  You should plan to take it easy for the rest of today and you should NOT DRIVE or use heavy machinery until tomorrow (because of the sedation medicines used during the test).    FOLLOW UP: Our staff will call the number listed on your records the next business day following your procedure.  We will call around 7:15- 8:00 am to check on you and address any questions or concerns that you may have regarding the information given to you following your procedure. If we do not reach you, we will leave a message.     If any biopsies were taken you will be contacted by phone or by letter within the next 1-3 weeks.  Please call us  at [phone] if you have not heard about the biopsies in 3 weeks.    SIGNATURES/CONFIDENTIALITY: You and/or your care partner have signed paperwork which will be entered into your electronic medical record.  These signatures attest to the fact that that the information above on your After Visit Summary has been reviewed and is understood.  Full responsibility of the confidentiality of this discharge information lies with you and/or your care-partner.

## 2024-05-13 NOTE — Progress Notes (Signed)
 Sedate, gd SR, tolerated procedure well, VSS, report to RN

## 2024-05-13 NOTE — Progress Notes (Signed)
 [facility] History and Physical   Primary Care Physician:  Benjamine Aland, MD   Reason for Procedure:   History of GI bleeding, marginal ulcer  Plan:    EGD      HPI: Vanessa Wells is a 58 y.o. female  here for EGD to re-evaluate surgical anastomosis and assess for interval healing of margina ulcer. She had a severe GI bleed end of March, on Brilinta  which could not be stopped due to recent STEMI with stenting. Was on aspirin  and brilinta , then transitioned to Plavix  per cardiology. On protonix  twice daily with no further bleeding. EGD to assess for mucosal healing. Exam done ON Plavix .    Otherwise feels well without any cardiopulmonary symptoms.   I have discussed risks / benefits of anesthesia and endoscopic procedure with Vanessa Wells and they wish to proceed with the exams as outlined today.    Past Medical History:  Diagnosis Date   Anxiety    Diabetes mellitus    borderline   Hypercholesterolemia    Hypertension    Obesity     Past Surgical History:  Procedure Laterality Date   ABDOMINAL HYSTERECTOMY     CHOLECYSTECTOMY     ESOPHAGOGASTRODUODENOSCOPY N/A 12/08/2023   Procedure: EGD (ESOPHAGOGASTRODUODENOSCOPY);  Surgeon: Rollin Dover, MD;  Location: [facility] ENDOSCOPY;  Service: Gastroenterology;  Laterality: N/A;   ESOPHAGOGASTRODUODENOSCOPY N/A 12/10/2023   Procedure: EGD (ESOPHAGOGASTRODUODENOSCOPY);  Surgeon: Vanessa Elspeth SQUIBB, MD;  Location: [facility] ENDOSCOPY;  Service: Gastroenterology;  Laterality: N/A;   GASTRIC BYPASS     HEMOSTASIS CLIP PLACEMENT  12/08/2023   Procedure: CONTROL OF HEMORRHAGE, GI TRACT, ENDOSCOPIC, BY CLIPPING OR OVERSEWING;  Surgeon: Rollin Dover, MD;  Location: [facility] ENDOSCOPY;  Service: Gastroenterology;;  PURASTATx2   HEMOSTASIS CLIP PLACEMENT  12/10/2023   Procedure: CONTROL OF HEMORRHAGE, GI TRACT, ENDOSCOPIC, BY CLIPPING OR OVERSEWING;  Surgeon: Vanessa Elspeth SQUIBB, MD;  Location: [facility] ENDOSCOPY;  Service: Gastroenterology;;     Prior to Admission medications   Medication Sig Start Date End Date Taking? Authorizing Provider  atorvastatin  (LIPITOR) 80 MG tablet Take 1 tablet (80 mg total) by mouth daily. 02/14/24   Ganji, Jay, MD  Cholecalciferol (TRUE VITAMIN D3) 1.25 MG (50000 UT) TABS Take 50,000 Units by mouth every Sunday.    [provider]  clopidogrel  (PLAVIX ) 75 MG tablet Take 1 tablet (75 mg total) by mouth daily. 01/15/24   Ladona Heinz, MD  cyanocobalamin (VITAMIN B12) 1000 MCG tablet Take 1 tablet (1,000 mcg total) by mouth daily. 01/21/24   Ladona Heinz, MD  metoprolol  succinate (TOPROL -XL) 50 MG 24 hr tablet Take 1 tablet (50 mg total) by mouth daily. 02/14/24   Ladona Heinz, MD  nitroGLYCERIN  (NITROSTAT ) 0.4 MG SL tablet Place 1 tablet (0.4 mg total) under the tongue every 5 (five) minutes as needed. 01/15/24 04/14/24  Ladona Heinz, MD  pantoprazole  (PROTONIX ) 40 MG tablet Take 1 tablet (40 mg total) by mouth 2 (two) times daily. 04/16/24   Kennedy-Smith, Colleen M, NP  sertraline  (ZOLOFT ) 100 MG tablet Take 100 mg by mouth daily.    [provider]    Current Outpatient Medications  Medication Sig Dispense Refill   atorvastatin  (LIPITOR) 80 MG tablet Take 1 tablet (80 mg total) by mouth daily. 90 tablet 3   Cholecalciferol (TRUE VITAMIN D3) 1.25 MG (50000 UT) TABS Take 50,000 Units by mouth every Sunday.     clopidogrel  (PLAVIX ) 75 MG tablet Take 1 tablet (75 mg total) by mouth daily. 90 tablet 3  cyanocobalamin (VITAMIN B12) 1000 MCG tablet Take 1 tablet (1,000 mcg total) by mouth daily.     metoprolol  succinate (TOPROL -XL) 50 MG 24 hr tablet Take 1 tablet (50 mg total) by mouth daily. 90 tablet 3   pantoprazole  (PROTONIX ) 40 MG tablet Take 1 tablet (40 mg total) by mouth 2 (two) times daily. 60 tablet 2   sertraline  (ZOLOFT ) 100 MG tablet Take 100 mg by mouth daily.     nitroGLYCERIN  (NITROSTAT ) 0.4 MG SL tablet Place 1 tablet (0.4 mg total) under the tongue every 5 (five) minutes as needed. 25  tablet 1   Current Facility-Administered Medications  Medication Dose Route Frequency Provider Last Rate Last Admin   0.9 %  sodium chloride  infusion  500 mL Intravenous Once Vanessa Wells, Elspeth SQUIBB, MD        Allergies as of 05/13/2024 - Review Complete 05/13/2024  Allergen Reaction Noted   Niaspan [niacin] Hives and Swelling 04/02/2012    Family History  Problem Relation Age of Onset   Cancer Other    Hypertension Other    Stroke Other    Diabetes Other     Social History   Socioeconomic History   Marital status: Single    Spouse name: Not on file   Number of children: Not on file   Years of education: Not on file   Highest education level: Not on file  Occupational History   Not on file  Tobacco Use   Smoking status: Never   Smokeless tobacco: Never  Substance and Sexual Activity   Alcohol use: No   Drug use: No   Sexual activity: Not on file  Other Topics Concern   Not on file  Social History Narrative   Not on file   Social Drivers of Health   Financial Resource Strain: Not on file  Food Insecurity: No Food Insecurity (12/07/2023)   Hunger Vital Sign    Worried About Running Out of Food in the Last Year: Never true    Ran Out of Food in the Last Year: Never true  Transportation Needs: No Transportation Needs (12/07/2023)   PRAPARE - Administrator, Civil Service (Medical): No    Lack of Transportation (Non-Medical): No  Physical Activity: Not on file  Stress: Not on file  Social Connections: Not on file  Intimate Partner Violence: Not At Risk (12/07/2023)   Humiliation, Afraid, Rape, and Kick questionnaire    Fear of Current or Ex-Partner: No    Emotionally Abused: No    Physically Abused: No    Sexually Abused: No    Review of Systems: All other review of systems negative except as mentioned in the HPI.  Physical Exam: Vital signs BP 135/75   Pulse (!) 52   Temp 97.9 F (36.6 C) (Temporal)   Ht 5' 4 (1.626 m)   Wt 171 lb (77.6 kg)    SpO2 99%   BMI 29.35 kg/m   General:   Alert,  Well-developed, pleasant and cooperative in NAD Lungs:  Clear throughout to auscultation.   Heart:  Regular rate and rhythm Abdomen:  Soft, nontender and nondistended.   Neuro/Psych:  Alert and cooperative. Normal mood and affect. A and O x 3  Marcey Naval, MD [facility]

## 2024-05-13 NOTE — Op Note (Signed)
 [facility] Patient Name: Vanessa Wells Procedure Date: 05/13/2024 2:55 PM MRN: [id number] Endoscopist: Elspeth P. Leigh , MD, [id number] Age: 58 Referring MD:  Date of Birth: 04-21-1966 Gender: Female Account #: [id number] Procedure:                Upper GI endoscopy Indications:              Follow-up of large marginal ulcer with hemorrhage -                            history of Roux-en-Y, was on Brilinta  / aspirin                             following STEMI and admitted with severe upper GIB                            in March, could not stop antiplatelet therapy.                            Eventually transitioned to Plavix  and now on                            protonix  40mg  BID. Anemia has resolved. Medicines:                Monitored Anesthesia Care Procedure:                Pre-Anesthesia Assessment:                           - Prior to the procedure, a History and Physical                            was performed, and patient medications and                            allergies were reviewed. The patient's tolerance of                            previous anesthesia was also reviewed. The risks                            and benefits of the procedure and the sedation                            options and risks were discussed with the patient.                            All questions were answered, and informed consent                            was obtained. Prior Anticoagulants: The patient has                            taken Plavix  (clopidogrel ), last dose was day of  procedure. ASA Grade Assessment: III - A patient                            with severe systemic disease. After reviewing the                            risks and benefits, the patient was deemed in                            satisfactory condition to undergo the procedure.                           After obtaining informed consent, the endoscope was                             passed under direct vision. Throughout the                            procedure, the patient's blood pressure, pulse, and                            oxygen saturations were monitored continuously. The                            GIF [id number] #[id number] was introduced through the                            mouth, and advanced to the proximal jejunum. The                            upper GI endoscopy was accomplished without                            difficulty. The patient tolerated the procedure                            well. Scope In: Scope Out: Findings:                 Esophagogastric landmarks were identified: the                            Z-line was found at 34 cm, the gastroesophageal                            junction was found at 34 cm and the upper extent of                            the gastric folds was found at 36 cm from the                            incisors.                           A 2 cm  hiatal hernia was present.                           There was a benign gastric inlet patch in the                            proximal esophagus. The exam of the esophagus was                            otherwise normal.                           Evidence of a Roux-en-Y gastrojejunostomy was                            found. The gastrojejunal anastomosis was                            characterized by an intact staple line. The                            previously noted ulcer has completely healed.                           The exam of the gastric pouch was otherwise normal.                            The pouch was small, retroflexed views of the GEJ                            not obtained.                           The examined small bowel limb was normal. Complications:            No immediate complications. Estimated blood loss:                            None. Estimated Blood Loss:     Estimated blood loss: none. Impression:               - Esophagogastric landmarks  identified.                           - 2 cm hiatal hernia.                           - Benign gastric inlet patch of the proximal                            esophagus.                           - Roux-en-Y gastrojejunostomy with gastrojejunal                            anastomosis characterized by an intact  staple line.                           - Interval healing of marginal ulcer.                           - Normal gastric pouch.                           - Normal examined small bowel limb. Recommendation:           - Patient has a contact number available for                            emergencies. The signs and symptoms of potential                            delayed complications were discussed with the                            patient. Return to normal activities tomorrow.                            Written discharge instructions were provided to the                            patient.                           - Resume previous diet.                           - Continue present medications including Plavix                            - I think okay to reduce protonix  to 40mg  once daily                           - Avoid all NSAIDs                           - Follow up as needed for any recurrent symptoms Elspeth P. Faiz Weber, MD 05/13/2024 3:38:19 PM This report has been signed electronically.

## 2024-05-14 ENCOUNTER — Telehealth: Payer: Self-pay

## 2024-05-14 NOTE — Telephone Encounter (Signed)
 No answer after follow up call. Unable to leave voice message.

## 2024-05-25 ENCOUNTER — Telehealth (HOSPITAL_COMMUNITY): Payer: Self-pay

## 2024-05-25 NOTE — Telephone Encounter (Signed)
 Attempted to call pt's sister Paulette.  Msg left asking pt's sister to please have pt return phone call and confirm if she would be able to make her 1045 orientation tomorrow.

## 2024-05-25 NOTE — Telephone Encounter (Addendum)
 Called pt to confirm cardiac rehab orientation appointment at 1045 on 05/25/24.  Pt confirmed, cardiac health history completed.

## 2024-05-25 NOTE — Telephone Encounter (Signed)
 Attempted to contact pt to confirm cardiac rehab orientation.  No answer, unable to leave msg due to mailbox being full.

## 2024-05-25 NOTE — Telephone Encounter (Deleted)
 Attempted to call pt's sister Paulette.  Msg left asking pt's sister to please have pt return phone call and confirm if she would be able to make her 1045 orientation tomorrow.

## 2024-05-26 ENCOUNTER — Encounter (HOSPITAL_COMMUNITY)
Admission: RE | Admit: 2024-05-26 | Discharge: 2024-05-26 | Disposition: A | Discharge status: Home / Self Care | Source: Ambulatory Visit | Attending: Cardiology | Admitting: Cardiology

## 2024-05-26 VITALS — BP 134/80 | HR 56 | Ht 65.0 in | Wt 180.8 lb

## 2024-05-26 DIAGNOSIS — I214 Non-ST elevation (NSTEMI) myocardial infarction: Secondary | ICD-10-CM | POA: Insufficient documentation

## 2024-05-26 DIAGNOSIS — Z955 Presence of coronary angioplasty implant and graft: Secondary | ICD-10-CM | POA: Diagnosis not present

## 2024-05-26 DIAGNOSIS — I2542 Coronary artery dissection: Secondary | ICD-10-CM

## 2024-05-26 DIAGNOSIS — I2102 ST elevation (STEMI) myocardial infarction involving left anterior descending coronary artery: Secondary | ICD-10-CM

## 2024-05-26 NOTE — Progress Notes (Signed)
 Cardiac Individual Treatment Plan  Patient Details  Name: Vanessa Wells MRN: [id number] Date of Birth: Nov 27, 1965 Referring Provider:   Flowsheet Row INTENSIVE CARDIAC REHAB ORIENT from 05/26/2024 in [facility]  Referring Provider Ladona Heinz, MD    Initial Encounter Date:  Flowsheet Row INTENSIVE CARDIAC REHAB ORIENT from 05/26/2024 in [facility]  Date 05/26/24    Visit Diagnosis: 11/22/23 STEMI  11/22/23 SCAD  11/22/23 DES LAD  Patient's Home Medications on Admission:  Current Outpatient Medications:    atorvastatin  (LIPITOR) 80 MG tablet, Take 1 tablet (80 mg total) by mouth daily., Disp: 90 tablet, Rfl: 3   Cholecalciferol (TRUE VITAMIN D3) 1.25 MG (50000 UT) TABS, Take 50,000 Units by mouth every Sunday., Disp: , Rfl:    clopidogrel  (PLAVIX ) 75 MG tablet, Take 1 tablet (75 mg total) by mouth daily., Disp: 90 tablet, Rfl: 3   cyanocobalamin (VITAMIN B12) 1000 MCG tablet, Take 1 tablet (1,000 mcg total) by mouth daily., Disp: , Rfl:    metoprolol  succinate (TOPROL -XL) 50 MG 24 hr tablet, Take 1 tablet (50 mg total) by mouth daily., Disp: 90 tablet, Rfl: 3   nitroGLYCERIN  (NITROSTAT ) 0.4 MG SL tablet, Place 1 tablet (0.4 mg total) under the tongue every 5 (five) minutes as needed., Disp: 25 tablet, Rfl: 1   pantoprazole  (PROTONIX ) 40 MG tablet, Take 1 tablet (40 mg total) by mouth 2 (two) times daily. (Patient taking differently: Take 40 mg by mouth daily.), Disp: 60 tablet, Rfl: 2   sertraline  (ZOLOFT ) 100 MG tablet, Take 100 mg by mouth daily., Disp: , Rfl:   Past Medical History: Past Medical History:  Diagnosis Date   Anxiety    Diabetes mellitus    borderline   Hypercholesterolemia    Hypertension    Obesity     Tobacco Use: Social History   Tobacco Use  Smoking Status Never  Smokeless Tobacco Never    Labs: Review Flowsheet       Latest  Ref Rng & Units 10/18/2011 12/06/2023 01/15/2024  Labs for ITP Cardiac and Pulmonary Rehab  Cholestrol 100 - 199 mg/dL - - 874   LDL (calc) 0 - 99 mg/dL - - 52   HDL-C >60 mg/dL - - 61   Trlycerides 0 - 149 mg/dL - - 55   TCO2 22 - 32 mmol/L 26  24  -    Capillary Blood Glucose: Lab Results  Component Value Date   GLUCAP 92 12/10/2023   GLUCAP 92 12/06/2023     Exercise Target Goals: Exercise Program Goal: Individual exercise prescription set using results from initial 6 min walk test and THRR while considering  patient's activity barriers and safety.   Exercise Prescription Goal: Initial exercise prescription builds to 30-45 minutes a day of aerobic activity, 2-3 days per week.  Home exercise guidelines will be given to patient during program as part of exercise prescription that the participant will acknowledge.  Activity Barriers & Risk Stratification:  Activity Barriers & Cardiac Risk Stratification - 05/26/24 1151       Activity Barriers & Cardiac Risk Stratification   Activity Barriers Balance Concerns;Other (comment)    Comments Flat feet, feels off balance at times.    Cardiac Risk Stratification High          6 Minute Walk:  6 Minute Walk     Row Name 05/26/24 1241  6 Minute Walk   Phase Initial     Distance 1476 feet     Walk Time 6 minutes     # of Rest Breaks 0     MPH 2.79     METS 3.63     RPE 11     Perceived Dyspnea  1     VO2 Peak 12.7     Symptoms Yes (comment)     Comments Mild shortness of breath.     Resting HR 56 bpm     Resting BP 134/80     Resting Oxygen Saturation  100 %     Exercise Oxygen Saturation  during 6 min walk 100 %     Max Ex. HR 85 bpm     Max Ex. BP 128/78     2 Minute Post BP 150/80        Oxygen Initial Assessment:   Oxygen Re-Evaluation:   Oxygen Discharge (Final Oxygen Re-Evaluation):   Initial Exercise Prescription:  Initial Exercise Prescription - 05/26/24 1400       Date of Initial Exercise  RX and Referring Provider   Date 05/26/24    Referring Provider Ladona Heinz, MD    Expected Discharge Date 08/19/24      Treadmill   MPH 2.5    Grade 0    Minutes 15    METs 2.91      Recumbant Elliptical   Level 1    RPM 50    Watts 40    Minutes 15    METs 2.9      Prescription Details   Frequency (times per week) 3    Duration Progress to 30 minutes of continuous aerobic without signs/symptoms of physical distress      Intensity   THRR 40-80% of Max Heartrate 66-132    Ratings of Perceived Exertion 11-13    Perceived Dyspnea 0-4      Progression   Progression Continue to progress workloads to maintain intensity without signs/symptoms of physical distress.      Resistance Training   Training Prescription Yes    Weight 3 lbs    Reps 10-15          Perform Capillary Blood Glucose checks as needed.  Exercise Prescription Changes:   Exercise Comments:   Exercise Goals and Review:   Exercise Goals     Row Name 05/26/24 1234             Exercise Goals   Increase Physical Activity Yes       Intervention Provide advice, education, support and counseling about physical activity/exercise needs.;Develop an individualized exercise prescription for aerobic and resistive training based on initial evaluation findings, risk stratification, comorbidities and participant's personal goals.       Expected Outcomes Short Term: Attend rehab on a regular basis to increase amount of physical activity.;Long Term: Exercising regularly at least 3-5 days a week.;Long Term: Add in home exercise to make exercise part of routine and to increase amount of physical activity.       Increase Strength and Stamina Yes       Intervention Provide advice, education, support and counseling about physical activity/exercise needs.;Develop an individualized exercise prescription for aerobic and resistive training based on initial evaluation findings, risk stratification, comorbidities and  participant's personal goals.       Expected Outcomes Short Term: Increase workloads from initial exercise prescription for resistance, speed, and METs.;Short Term: Perform resistance training exercises routinely during rehab and add  in resistance training at home;Long Term: Improve cardiorespiratory fitness, muscular endurance and strength as measured by increased METs and functional capacity ( )       Able to understand and use rate of perceived exertion (RPE) scale Yes       Intervention Provide education and explanation on how to use RPE scale       Expected Outcomes Short Term: Able to use RPE daily in rehab to express subjective intensity level;Long Term:  Able to use RPE to guide intensity level when exercising independently       Knowledge and understanding of Target Heart Rate Range (THRR) Yes       Intervention Provide education and explanation of THRR including how the numbers were predicted and where they are located for reference       Expected Outcomes Short Term: Able to state/look up THRR;Long Term: Able to use THRR to govern intensity when exercising independently;Short Term: Able to use daily as guideline for intensity in rehab       Able to check pulse independently Yes       Intervention Provide education and demonstration on how to check pulse in carotid and radial arteries.;Review the importance of being able to check your own pulse for safety during independent exercise       Expected Outcomes Long Term: Able to check pulse independently and accurately;Short Term: Able to explain why pulse checking is important during independent exercise       Understanding of Exercise Prescription Yes       Intervention Provide education, explanation, and written materials on patient's individual exercise prescription       Expected Outcomes Short Term: Able to explain program exercise prescription;Long Term: Able to explain home exercise prescription to exercise independently           Exercise Goals Re-Evaluation :   Discharge Exercise Prescription (Final Exercise Prescription Changes):   Nutrition:  Target Goals: Understanding of nutrition guidelines, daily intake of sodium 1500mg , cholesterol 200mg , calories 30% from fat and 7% or less from saturated fats, daily to have 5 or more servings of fruits and vegetables.  Biometrics:  Pre Biometrics - 05/26/24 1047       Pre Biometrics   Waist Circumference 39.75 inches    Hip Circumference 45 inches    Waist to Hip Ratio 0.88 %    Triceps Skinfold 24 mm    % Body Fat 40.1 %    Grip Strength 22 kg    Flexibility 16.5 in    Single Leg Stand 30 seconds           Nutrition Therapy Plan and Nutrition Goals:   Nutrition Assessments:  MEDIFICTS Score Key: >=70 Need to make dietary changes  40-70 Heart Healthy Diet <= 40 Therapeutic Level Cholesterol Diet    Picture Your Plate Scores: <59 Unhealthy dietary pattern with much room for improvement. 41-50 Dietary pattern unlikely to meet recommendations for good health and room for improvement. 51-60 More healthful dietary pattern, with some room for improvement.  >60 Healthy dietary pattern, although there may be some specific behaviors that could be improved.    Nutrition Goals Re-Evaluation:   Nutrition Goals Re-Evaluation:   Nutrition Goals Discharge (Final Nutrition Goals Re-Evaluation):   Psychosocial: Target Goals: Acknowledge presence or absence of significant depression and/or stress, maximize coping skills, provide positive support system. Participant is able to verbalize types and ability to use techniques and skills needed for reducing stress and depression.  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 She lost her car but is saving for a new one. She previously was seen by a counselor but not since COVID. She would like a referral to a counselor.      Family Dynamics   Good Support System? Yes    Comments Louretta has gret support from her family especially a cousin who is like a sister to her. She has 4 sisters and 1 brother.      Barriers   Psychosocial barriers to participate in program Psychosocial barriers identified (see note);The patient should benefit from training in stress management and relaxation.      Screening Interventions   Interventions Encouraged to exercise;To provide support and resources with identified psychosocial needs;Provide feedback about the scores to participant    Expected Outcomes Short Term goal: Utilizing psychosocial counselor, staff and physician to assist with identification of specific Stressors or current issues interfering with healing process. Setting desired goal for each stressor or current issue identified.;Long Term Goal: Stressors or current issues are controlled or eliminated.;Short Term goal: Identification and review with participant of any Quality of Life or Depression concerns found by scoring the questionnaire.;Long Term goal: The participant improves quality of Life and PHQ9 Scores as seen by post scores and/or verbalization of changes          Quality of Life Scores:  Quality of Life - 05/26/24 1157       Quality of Life   Select Quality of Life      Quality of Life Scores   Health/Function Pre 19.83 %    Socioeconomic Pre 16.5 %    Psych/Spiritual Pre 19.64 %    Family Pre 13.5 %    GLOBAL Pre 18.18 %         Scores of 19 and below usually indicate a poorer quality of life in these areas.  A difference of  2-3 points  is a clinically meaningful difference.  A difference of 2-3 points in the total score of the Quality of Life Index has been associated with significant improvement in overall quality of life, self-image, physical symptoms, and general health in studies assessing change in quality of life.  PHQ-9: Review Flowsheet       05/26/2024 01/24/2018  Depression screen PHQ 2/9  Decreased Interest 1 0  Down, Depressed, Hopeless 1 0  PHQ - 2 Score 2 0  Altered sleeping 2 -  Tired, decreased energy 1 -  Change in appetite 2 -  Feeling bad or failure about yourself  1 -  Trouble concentrating 1 -  Moving slowly or fidgety/restless 1 -  Suicidal thoughts 0 -  PHQ-9 Score 10 -  Difficult doing work/chores Not difficult at all -   Interpretation of Total Score  Total Score Depression Severity:  1-4 = Minimal depression, 5-9 = Mild depression, 10-14 = Moderate depression, 15-19 = Moderately severe depression, 20-27 = Severe depression   Psychosocial Evaluation and Intervention:   Psychosocial Re-Evaluation:   Psychosocial Discharge (Final Psychosocial Re-Evaluation):   Vocational Rehabilitation: Provide vocational rehab assistance to qualifying candidates.   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          Education: Education Goals: Education classes will be provided on a weekly basis, covering required topics. Participant will state understanding/return demonstration of topics presented.     Core Videos: Exercise    Move It!  Clinical staff conducted group or individual video education with verbal and written material and guidebook.   Patient learns the recommended Pritikin exercise program. Exercise with the goal of living a long, healthy life. Some of the health benefits of exercise include controlled diabetes, healthier blood pressure levels, improved cholesterol levels, improved heart and lung capacity, improved sleep, and better body composition. Everyone should speak with their doctor before starting or changing an exercise routine.  Biomechanical Limitations Clinical staff conducted group or individual video education with verbal and written material and guidebook.  Patient learns how biomechanical limitations can impact exercise and how we can mitigate and possibly overcome limitations to have an impactful and balanced exercise routine.  Body Composition Clinical staff conducted group or individual video education with verbal and written material and guidebook.  Patient learns that body composition (ratio of muscle mass to fat mass) is a key component to assessing overall fitness, rather than body weight alone. Increased fat mass, especially visceral belly fat, can put us  at increased risk for metabolic syndrome, type 2 diabetes, heart disease, and even death. It is recommended to combine diet and exercise (cardiovascular and resistance training) to improve your body composition. Seek guidance from your physician and exercise physiologist before implementing an exercise routine.  Exercise Action Plan Clinical staff conducted group or individual video education with verbal and written material and guidebook.  Patient learns the recommended strategies to achieve and enjoy long-term exercise adherence, including variety, self-motivation, self-efficacy, and positive decision making. Benefits of exercise include fitness, good health, weight management, more energy, better sleep, less stress, and overall well-being.  Medical   Heart Disease Risk Reduction Clinical staff conducted group or individual video education with verbal  and written material and guidebook.  Patient learns our heart is our most vital organ as it circulates oxygen, nutrients, white blood cells, and hormones throughout the entire body, and carries waste away. Data supports a plant-based eating plan like the Pritikin Program for its effectiveness in slowing progression of and reversing heart disease. The video provides a number of recommendations to address heart disease.   Metabolic Syndrome and Belly Fat  Clinical staff conducted group or individual video education with verbal and written material and guidebook.  Patient learns what metabolic syndrome is, how it leads to heart disease, and how one can reverse it and keep it from coming back. You have metabolic syndrome if you have 3 of the following 5 criteria: abdominal obesity, high blood pressure, high triglycerides, low HDL cholesterol, and high blood sugar.  Hypertension and Heart Disease Clinical staff conducted group or individual video education with verbal and written material and guidebook.  Patient learns that high blood pressure, or hypertension, is very common in the [place] . Hypertension is largely due to excessive salt intake, but other important risk factors include being overweight, physical inactivity, drinking too much alcohol, smoking,  and not eating enough potassium from fruits and vegetables. High blood pressure is a leading risk factor for heart attack, stroke, congestive heart failure, dementia, kidney failure, and premature death. Long-term effects of excessive salt intake include stiffening of the arteries and thickening of heart muscle and organ damage. Recommendations include ways to reduce hypertension and the risk of heart disease.  Diseases of Our Time - Focusing on Diabetes Clinical staff conducted group or individual video education with verbal and written material and guidebook.  Patient learns why the best way to stop diseases of our time is prevention, through  food and other lifestyle changes. Medicine (such as prescription pills and surgeries) is often only a Band-Aid on the problem, not a long-term solution. Most common diseases of our time include obesity, type 2 diabetes, hypertension, heart disease, and cancer. The Pritikin Program is recommended and has been proven to help reduce, reverse, and/or prevent the damaging effects of metabolic syndrome.  Nutrition   Overview of the Pritikin Eating Plan  Clinical staff conducted group or individual video education with verbal and written material and guidebook.  Patient learns about the Pritikin Eating Plan for disease risk reduction. The Pritikin Eating Plan emphasizes a wide variety of unrefined, minimally-processed carbohydrates, like fruits, vegetables, whole grains, and legumes. Go, Caution, and Stop food choices are explained. Plant-based and lean animal proteins are emphasized. Rationale provided for low sodium intake for blood pressure control, low added sugars for blood sugar stabilization, and low added fats and oils for coronary artery disease risk reduction and weight management.  Calorie Density  Clinical staff conducted group or individual video education with verbal and written material and guidebook.  Patient learns about calorie density and how it impacts the Pritikin Eating Plan. Knowing the characteristics of the food you choose will help you decide whether those foods will lead to weight gain or weight loss, and whether you want to consume more or less of them. Weight loss is usually a side effect of the Pritikin Eating Plan because of its focus on low calorie-dense foods.  Label Reading  Clinical staff conducted group or individual video education with verbal and written material and guidebook.  Patient learns about the Pritikin recommended label reading guidelines and corresponding recommendations regarding calorie density, added sugars, sodium content, and whole grains.  Dining Out -  Part 1  Clinical staff conducted group or individual video education with verbal and written material and guidebook.  Patient learns that restaurant meals can be sabotaging because they can be so high in calories, fat, sodium, and/or sugar. Patient learns recommended strategies on how to positively address this and avoid unhealthy pitfalls.  Facts on Fats  Clinical staff conducted group or individual video education with verbal and written material and guidebook.  Patient learns that lifestyle modifications can be just as effective, if not more so, as many medications for lowering your risk of heart disease. A Pritikin lifestyle can help to reduce your risk of inflammation and atherosclerosis (cholesterol build-up, or plaque, in the artery walls). Lifestyle interventions such as dietary choices and physical activity address the cause of atherosclerosis. A review of the types of fats and their impact on blood cholesterol levels, along with dietary recommendations to reduce fat intake is also included.  Nutrition Action Plan  Clinical staff conducted group or individual video education with verbal and written material and guidebook.  Patient learns how to incorporate Pritikin recommendations into their lifestyle. Recommendations include planning and keeping personal health goals in mind  as an important part of their success.  Healthy Mind-Set    Healthy Minds, Bodies, Hearts  Clinical staff conducted group or individual video education with verbal and written material and guidebook.  Patient learns how to identify when they are stressed. Video will discuss the impact of that stress, as well as the many benefits of stress management. Patient will also be introduced to stress management techniques. The way we think, act, and feel has an impact on our hearts.  How Our Thoughts Can Heal Our Hearts  Clinical staff conducted group or individual video education with verbal and written material and  guidebook.  Patient learns that negative thoughts can cause depression and anxiety. This can result in negative lifestyle behavior and serious health problems. Cognitive behavioral therapy is an effective method to help control our thoughts in order to change and improve our emotional outlook.  Additional Videos:  Exercise    Improving Performance  Clinical staff conducted group or individual video education with verbal and written material and guidebook.  Patient learns to use a non-linear approach by alternating intensity levels and lengths of time spent exercising to help burn more calories and lose more body fat. Cardiovascular exercise helps improve heart health, metabolism, hormonal balance, blood sugar control, and recovery from fatigue. Resistance training improves strength, endurance, balance, coordination, reaction time, metabolism, and muscle mass. Flexibility exercise improves circulation, posture, and balance. Seek guidance from your physician and exercise physiologist before implementing an exercise routine and learn your capabilities and proper form for all exercise.  Introduction to Yoga  Clinical staff conducted group or individual video education with verbal and written material and guidebook.  Patient learns about yoga, a discipline of the coming together of mind, breath, and body. The benefits of yoga include improved flexibility, improved range of motion, better posture and core strength, increased lung function, weight loss, and positive self-image. Yoga's heart health benefits include lowered blood pressure, healthier heart rate, decreased cholesterol and triglyceride levels, improved immune function, and reduced stress. Seek guidance from your physician and exercise physiologist before implementing an exercise routine and learn your capabilities and proper form for all exercise.  Medical   Aging: Enhancing Your Quality of Life  Clinical staff conducted group or individual  video education with verbal and written material and guidebook.  Patient learns key strategies and recommendations to stay in good physical health and enhance quality of life, such as prevention strategies, having an advocate, securing a Health Care Proxy and Power of Attorney, and keeping a list of medications and system for tracking them. It also discusses how to avoid risk for bone loss.  Biology of Weight Control  Clinical staff conducted group or individual video education with verbal and written material and guidebook.  Patient learns that weight gain occurs because we consume more calories than we burn (eating more, moving less). Even if your body weight is normal, you may have higher ratios of fat compared to muscle mass. Too much body fat puts you at increased risk for cardiovascular disease, heart attack, stroke, type 2 diabetes, and obesity-related cancers. In addition to exercise, following the Pritikin Eating Plan can help reduce your risk.  Decoding Lab Results  Clinical staff conducted group or individual video education with verbal and written material and guidebook.  Patient learns that lab test reflects one measurement whose values change over time and are influenced by many factors, including medication, stress, sleep, exercise, food, hydration, pre-existing medical conditions, and more. It is recommended to use  the knowledge from this video to become more involved with your lab results and evaluate your numbers to speak with your doctor.   Diseases of Our Time - Overview  Clinical staff conducted group or individual video education with verbal and written material and guidebook.  Patient learns that according to the CDC, 50% to 70% of chronic diseases (such as obesity, type 2 diabetes, elevated lipids, hypertension, and heart disease) are avoidable through lifestyle improvements including healthier food choices, listening to satiety cues, and increased physical activity.  Sleep  Disorders Clinical staff conducted group or individual video education with verbal and written material and guidebook.  Patient learns how good quality and duration of sleep are important to overall health and well-being. Patient also learns about sleep disorders and how they impact health along with recommendations to address them, including discussing with a physician.  Nutrition  Dining Out - Part 2 Clinical staff conducted group or individual video education with verbal and written material and guidebook.  Patient learns how to plan ahead and communicate in order to maximize their dining experience in a healthy and nutritious manner. Included are recommended food choices based on the type of restaurant the patient is visiting.   Fueling a Banker conducted group or individual video education with verbal and written material and guidebook.  There is a strong connection between our food choices and our health. Diseases like obesity and type 2 diabetes are very prevalent and are in large-part due to lifestyle choices. The Pritikin Eating Plan provides plenty of food and hunger-curbing satisfaction. It is easy to follow, affordable, and helps reduce health risks.  Menu Workshop  Clinical staff conducted group or individual video education with verbal and written material and guidebook.  Patient learns that restaurant meals can sabotage health goals because they are often packed with calories, fat, sodium, and sugar. Recommendations include strategies to plan ahead and to communicate with the manager, chef, or server to help order a healthier meal.  Planning Your Eating Strategy  Clinical staff conducted group or individual video education with verbal and written material and guidebook.  Patient learns about the Pritikin Eating Plan and its benefit of reducing the risk of disease. The Pritikin Eating Plan does not focus on calories. Instead, it emphasizes high-quality,  nutrient-rich foods. By knowing the characteristics of the foods, we choose, we can determine their calorie density and make informed decisions.  Targeting Your Nutrition Priorities  Clinical staff conducted group or individual video education with verbal and written material and guidebook.  Patient learns that lifestyle habits have a tremendous impact on disease risk and progression. This video provides eating and physical activity recommendations based on your personal health goals, such as reducing LDL cholesterol, losing weight, preventing or controlling type 2 diabetes, and reducing high blood pressure.  Vitamins and Minerals  Clinical staff conducted group or individual video education with verbal and written material and guidebook.  Patient learns different ways to obtain key vitamins and minerals, including through a recommended healthy diet. It is important to discuss all supplements you take with your doctor.   Healthy Mind-Set    Smoking Cessation  Clinical staff conducted group or individual video education with verbal and written material and guidebook.  Patient learns that cigarette smoking and tobacco addiction pose a serious health risk which affects millions of people. Stopping smoking will significantly reduce the risk of heart disease, lung disease, and many forms of cancer. Recommended strategies for quitting are covered,  including working with your doctor to develop a successful plan.  Culinary   Becoming a Set designer conducted group or individual video education with verbal and written material and guidebook.  Patient learns that cooking at home can be healthy, cost-effective, quick, and puts them in control. Keys to cooking healthy recipes will include looking at your recipe, assessing your equipment needs, planning ahead, making it simple, choosing cost-effective seasonal ingredients, and limiting the use of added fats, salts, and sugars.  Cooking -  Breakfast and Snacks  Clinical staff conducted group or individual video education with verbal and written material and guidebook.  Patient learns how important breakfast is to satiety and nutrition through the entire day. Recommendations include key foods to eat during breakfast to help stabilize blood sugar levels and to prevent overeating at meals later in the day. Planning ahead is also a key component.  Cooking - Educational psychologist conducted group or individual video education with verbal and written material and guidebook.  Patient learns eating strategies to improve overall health, including an approach to cook more at home. Recommendations include thinking of animal protein as a side on your plate rather than center stage and focusing instead on lower calorie dense options like vegetables, fruits, whole grains, and plant-based proteins, such as beans. Making sauces in large quantities to freeze for later and leaving the skin on your vegetables are also recommended to maximize your experience.  Cooking - Healthy Salads and Dressing Clinical staff conducted group or individual video education with verbal and written material and guidebook.  Patient learns that vegetables, fruits, whole grains, and legumes are the foundations of the Pritikin Eating Plan. Recommendations include how to incorporate each of these in flavorful and healthy salads, and how to create homemade salad dressings. Proper handling of ingredients is also covered. Cooking - Soups and [employer] - Soups and Desserts Clinical staff conducted group or individual video education with verbal and written material and guidebook.  Patient learns that Pritikin soups and desserts make for easy, nutritious, and delicious snacks and meal components that are low in sodium, fat, sugar, and calorie density, while high in vitamins, minerals, and filling fiber. Recommendations include simple and healthy ideas for soups and  desserts.   Overview     The Pritikin Solution Program Overview Clinical staff conducted group or individual video education with verbal and written material and guidebook.  Patient learns that the results of the Pritikin Program have been documented in more than 100 articles published in peer-reviewed journals, and the benefits include reducing risk factors for (and, in some cases, even reversing) high cholesterol, high blood pressure, type 2 diabetes, obesity, and more! An overview of the three key pillars of the Pritikin Program will be covered: eating well, doing regular exercise, and having a healthy mind-set.  WORKSHOPS  Exercise: Exercise Basics: Building Your Action Plan Clinical staff led group instruction and group discussion with PowerPoint presentation and patient guidebook. To enhance the learning environment the use of posters, models and videos may be added. At the conclusion of this workshop, patients will comprehend the difference between physical activity and exercise, as well as the benefits of incorporating both, into their routine. Patients will understand the FITT (Frequency, Intensity, Time, and Type) principle and how to use it to build an exercise action plan. In addition, safety concerns and other considerations for exercise and cardiac rehab will be addressed by the presenter. The purpose of this lesson  is to promote a comprehensive and effective weekly exercise routine in order to improve patients' overall level of fitness.   Managing Heart Disease: Your Path to a Healthier Heart Clinical staff led group instruction and group discussion with PowerPoint presentation and patient guidebook. To enhance the learning environment the use of posters, models and videos may be added.At the conclusion of this workshop, patients will understand the anatomy and physiology of the heart. Additionally, they will understand how Pritikin's three pillars impact the risk factors, the  progression, and the management of heart disease.  The purpose of this lesson is to provide a high-level overview of the heart, heart disease, and how the Pritikin lifestyle positively impacts risk factors.  Exercise Biomechanics Clinical staff led group instruction and group discussion with PowerPoint presentation and patient guidebook. To enhance the learning environment the use of posters, models and videos may be added. Patients will learn how the structural parts of their bodies function and how these functions impact their daily activities, movement, and exercise. Patients will learn how to promote a neutral spine, learn how to manage pain, and identify ways to improve their physical movement in order to promote healthy living. The purpose of this lesson is to expose patients to common physical limitations that impact physical activity. Participants will learn practical ways to adapt and manage aches and pains, and to minimize their effect on regular exercise. Patients will learn how to maintain good posture while sitting, walking, and lifting.  Balance Training and Fall Prevention  Clinical staff led group instruction and group discussion with PowerPoint presentation and patient guidebook. To enhance the learning environment the use of posters, models and videos may be added. At the conclusion of this workshop, patients will understand the importance of their sensorimotor skills (vision, proprioception, and the vestibular system) in maintaining their ability to balance as they age. Patients will apply a variety of balancing exercises that are appropriate for their current level of function. Patients will understand the common causes for poor balance, possible solutions to these problems, and ways to modify their physical environment in order to minimize their fall risk. The purpose of this lesson is to teach patients about the importance of maintaining balance as they age and ways to  minimize their risk of falling.  WORKSHOPS   Nutrition:  Fueling a Ship broker led group instruction and group discussion with PowerPoint presentation and patient guidebook. To enhance the learning environment the use of posters, models and videos may be added. Patients will review the foundational principles of the Pritikin Eating Plan and understand what constitutes a serving size in each of the food groups. Patients will also learn Pritikin-friendly foods that are better choices when away from home and review make-ahead meal and snack options. Calorie density will be reviewed and applied to three nutrition priorities: weight maintenance, weight loss, and weight gain. The purpose of this lesson is to reinforce (in a group setting) the key concepts around what patients are recommended to eat and how to apply these guidelines when away from home by planning and selecting Pritikin-friendly options. Patients will understand how calorie density may be adjusted for different weight management goals.  Mindful Eating  Clinical staff led group instruction and group discussion with PowerPoint presentation and patient guidebook. To enhance the learning environment the use of posters, models and videos may be added. Patients will briefly review the concepts of the Pritikin Eating Plan and the importance of low-calorie dense foods. The concept of mindful  eating will be introduced as well as the importance of paying attention to internal hunger signals. Triggers for non-hunger eating and techniques for dealing with triggers will be explored. The purpose of this lesson is to provide patients with the opportunity to review the basic principles of the Pritikin Eating Plan, discuss the value of eating mindfully and how to measure internal cues of hunger and fullness using the Hunger Scale. Patients will also discuss reasons for non-hunger eating and learn strategies to use for controlling emotional  eating.  Targeting Your Nutrition Priorities Clinical staff led group instruction and group discussion with PowerPoint presentation and patient guidebook. To enhance the learning environment the use of posters, models and videos may be added. Patients will learn how to determine their genetic susceptibility to disease by reviewing their family history. Patients will gain insight into the importance of diet as part of an overall healthy lifestyle in mitigating the impact of genetics and other environmental insults. The purpose of this lesson is to provide patients with the opportunity to assess their personal nutrition priorities by looking at their family history, their own health history and current risk factors. Patients will also be able to discuss ways of prioritizing and modifying the Pritikin Eating Plan for their highest risk areas  Menu  Clinical staff led group instruction and group discussion with PowerPoint presentation and patient guidebook. To enhance the learning environment the use of posters, models and videos may be added. Using menus brought in from [employer], or printed from [employer], patients will apply the Pritikin dining out guidelines that were presented in the [employer] video. Patients will also be able to practice these guidelines in a variety of provided scenarios. The purpose of this lesson is to provide patients with the opportunity to practice hands-on learning of the Pritikin Dining Out guidelines with actual menus and practice scenarios.  Label Reading Clinical staff led group instruction and group discussion with PowerPoint presentation and patient guidebook. To enhance the learning environment the use of posters, models and videos may be added. Patients will review and discuss the Pritikin label reading guidelines presented in Pritikin's Label Reading Educational series video. Using fool labels brought in from local grocery stores and  markets, patients will apply the label reading guidelines and determine if the packaged food meet the Pritikin guidelines. The purpose of this lesson is to provide patients with the opportunity to review, discuss, and practice hands-on learning of the Pritikin Label Reading guidelines with actual packaged food labels. Cooking School  Pritikin's [employer] are designed to teach patients ways to prepare quick, simple, and affordable recipes at home. The importance of nutrition's role in chronic disease risk reduction is reflected in its emphasis in the overall Pritikin program. By learning how to prepare essential core Pritikin Eating Plan recipes, patients will increase control over what they eat; be able to customize the flavor of foods without the use of added salt, sugar, or fat; and improve the quality of the food they consume. By learning a set of core recipes which are easily assembled, quickly prepared, and affordable, patients are more likely to prepare more healthy foods at home. These workshops focus on convenient breakfasts, simple entres, side dishes, and desserts which can be prepared with minimal effort and are consistent with nutrition recommendations for cardiovascular risk reduction. Cooking [employer] are taught by a Armed forces logistics/support/administrative officer (RD) who has been trained by the [employer]. The chef or RD  has a clear understanding of the importance of minimizing - if not completely eliminating - added fat, sugar, and sodium in recipes. Throughout the series of Cooking School Workshop sessions, patients will learn about healthy ingredients and efficient methods of cooking to build confidence in their capability to prepare    Cooking School weekly topics:  Adding Flavor- Sodium-Free  Fast and Healthy Breakfasts  Powerhouse Plant-Based Proteins  Satisfying Salads and Dressings  Simple Sides and Sauces  International Cuisine-Spotlight on the [employer]  Zones  Delicious Desserts  Savory Soups  [employer] - Meals in a Astronomer Appetizers and Snacks  Comforting Weekend Breakfasts  One-Pot Wonders   Fast Evening Meals  Landscape architect Your Pritikin Plate  WORKSHOPS   Healthy Mindset (Psychosocial):  Focused Goals, Sustainable Changes Clinical staff led group instruction and group discussion with PowerPoint presentation and patient guidebook. To enhance the learning environment the use of posters, models and videos may be added. Patients will be able to apply effective goal setting strategies to establish at least one personal goal, and then take consistent, meaningful action toward that goal. They will learn to identify common barriers to achieving personal goals and develop strategies to overcome them. Patients will also gain an understanding of how our mind-set can impact our ability to achieve goals and the importance of cultivating a positive and growth-oriented mind-set. The purpose of this lesson is to provide patients with a deeper understanding of how to set and achieve personal goals, as well as the tools and strategies needed to overcome common obstacles which may arise along the way.  From Head to Heart: The Power of a Healthy Outlook  Clinical staff led group instruction and group discussion with PowerPoint presentation and patient guidebook. To enhance the learning environment the use of posters, models and videos may be added. Patients will be able to recognize and describe the impact of emotions and mood on physical health. They will discover the importance of self-care and explore self-care practices which may work for them. Patients will also learn how to utilize the 4 C's to cultivate a healthier outlook and better manage stress and challenges. The purpose of this lesson is to demonstrate to patients how a healthy outlook is an essential part of maintaining good health, especially as they continue their  cardiac rehab journey.  Healthy Sleep for a Healthy Heart Clinical staff led group instruction and group discussion with PowerPoint presentation and patient guidebook. To enhance the learning environment the use of posters, models and videos may be added. At the conclusion of this workshop, patients will be able to demonstrate knowledge of the importance of sleep to overall health, well-being, and quality of life. They will understand the symptoms of, and treatments for, common sleep disorders. Patients will also be able to identify daytime and nighttime behaviors which impact sleep, and they will be able to apply these tools to help manage sleep-related challenges. The purpose of this lesson is to provide patients with a general overview of sleep and outline the importance of quality sleep. Patients will learn about a few of the most common sleep disorders. Patients will also be introduced to the concept of "sleep hygiene," and discover ways to self-manage certain sleeping problems through simple daily behavior changes. Finally, the workshop will motivate patients by clarifying the links between quality sleep and their goals of heart-healthy living.   Recognizing and Reducing Stress Clinical staff led group instruction and group discussion with PowerPoint presentation and patient  guidebook. To enhance the learning environment the use of posters, models and videos may be added. At the conclusion of this workshop, patients will be able to understand the types of stress reactions, differentiate between acute and chronic stress, and recognize the impact that chronic stress has on their health. They will also be able to apply different coping mechanisms, such as reframing negative self-talk. Patients will have the opportunity to practice a variety of stress management techniques, such as deep abdominal breathing, progressive muscle relaxation, and/or guided imagery.  The purpose of this lesson is to educate  patients on the role of stress in their lives and to provide healthy techniques for coping with it.  Learning Barriers/Preferences:  Learning Barriers/Preferences - 05/26/24 1158       Learning Barriers/Preferences   Learning Barriers None    Learning Preferences Audio;Skilled Demonstration;Video;Individual Instruction;Group Instruction          Education Topics:  Knowledge Questionnaire Score:  Knowledge Questionnaire Score - 05/26/24 1406       Knowledge Questionnaire Score   Pre Score 22/24          Core Components/Risk Factors/Patient Goals at Admission:  Personal Goals and Risk Factors at Admission - 05/26/24 1157       Core Components/Risk Factors/Patient Goals on Admission    Weight Management Yes;Obesity;Weight Loss    Intervention Weight Management/Obesity: Establish reasonable short term and long term weight goals.;Obesity: Provide education and appropriate resources to help participant work on and attain dietary goals.    Admit Weight 180 lb 12.4 oz (82 kg)    Expected Outcomes Short Term: Continue to assess and modify interventions until short term weight is achieved;Long Term: Adherence to nutrition and physical activity/exercise program aimed toward attainment of established weight goal;Weight Loss: Understanding of general recommendations for a balanced deficit meal plan, which promotes 1-2 lb weight loss per week and includes a negative energy balance of [phone] kcal/d    Hypertension Yes    Intervention Provide education on lifestyle modifcations including regular physical activity/exercise, weight management, moderate sodium restriction and increased consumption of fresh fruit, vegetables, and low fat dairy, alcohol moderation, and smoking cessation.;Monitor prescription use compliance.    Expected Outcomes Short Term: Continued assessment and intervention until BP is < 140/59mm HG in hypertensive participants. < 130/95mm HG in hypertensive participants with  diabetes, heart failure or chronic kidney disease.;Long Term: Maintenance of blood pressure at goal levels.    Lipids Yes    Intervention Provide education and support for participant on nutrition & aerobic/resistive exercise along with prescribed medications to achieve LDL 70mg , HDL >40mg .    Expected Outcomes Short Term: Participant states understanding of desired cholesterol values and is compliant with medications prescribed. Participant is following exercise prescription and nutrition guidelines.;Long Term: Cholesterol controlled with medications as prescribed, with individualized exercise RX and with personalized nutrition plan. Value goals: LDL < 70mg , HDL > 40 mg.    Stress Yes    Intervention Offer individual and/or small group education and counseling on adjustment to heart disease, stress management and health-related lifestyle change. Teach and support self-help strategies.;Refer participants experiencing significant psychosocial distress to appropriate mental health specialists for further evaluation and treatment. When possible, include family members and significant others in education/counseling sessions.    Expected Outcomes Short Term: Participant demonstrates changes in health-related behavior, relaxation and other stress management skills, ability to obtain effective social support, and compliance with psychotropic medications if prescribed.;Long Term: Emotional wellbeing is indicated by absence of clinically significant  psychosocial distress or social isolation.          Core Components/Risk Factors/Patient Goals Review:    Core Components/Risk Factors/Patient Goals at Discharge (Final Review):    ITP Comments:  ITP Comments     Row Name 05/26/24 1047           ITP Comments Medical Director- Dr. Wilbert Bihari, MD. Introduction to the Pritikin Education / Intensive Cardiac Rehab Program. Reviewed initial orientation folder with Delon.          Comments: Margerite  attended orientation for the cardiac rehabilitation program on  05/26/2024  to perform initial intake and exercise walk test. She was introduced to the [employer] education and orientation packet was reviewed. Completed 6-minute walk test, measurements, initial ITP, and exercise prescription. Vital signs stable. Telemetry-normal sinus rhythm, she c/o mild shortness of breath during the walk test, which resolved with rest.   Service time was from 1047 to 1256. Arnoldo CHRISTELLA Gal, MS, ACSM CEP 05/26/2024 1446

## 2024-05-26 NOTE — Progress Notes (Signed)
  PSYCHOSOCIAL ASSESSMENT   Patient psychosocial assessment reveals patient shows fair coping skills with a positive outlook.  Psychosocial barriers to rehab participation identified include sleep concerns, stress concerns from being a caregiver, financial, and transportation. Offered emotional support and reassurance. Agreeable for ambulatory referral to [facility] and verbalizes understanding that this is a separate cost from Cardiac/Pulmonary rehab and there may be a financial obligation depending upon patient's insurance plan coverage.  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 She's recently retired due to system changes and is interested in going back to work. She lost her car but is saving for a new one. She previously was seen by a counselor but not since COVID. She would like a referral to a counselor.

## 2024-05-26 NOTE — Progress Notes (Signed)
 Cardiac Rehab Medication Review   Does the patient  feel that his/her medications are working for him/her?  Yes  Has the patient been experiencing any side effects to the medications prescribed?  No  Does the patient measure his/her own blood pressure or blood glucose at home?  No, Vanessa Wells doesn't typically check her BP unless she's having symptoms. She's not diabetic.  Does the patient have any problems obtaining medications due to transportation or finances?   Yes  Understanding of regimen: excellent Understanding of indications: excellent Potential of compliance: excellent    Comments: Updated medication list. She is taking a bariatric vitamin that's not listed.    Arnoldo Gal 05/26/2024 12:21 PM

## 2024-06-01 ENCOUNTER — Encounter (HOSPITAL_COMMUNITY)
Admission: RE | Admit: 2024-06-01 | Discharge: 2024-06-01 | Disposition: A | Discharge status: Home / Self Care | Source: Ambulatory Visit | Attending: Cardiology | Admitting: Cardiology

## 2024-06-01 DIAGNOSIS — I2102 ST elevation (STEMI) myocardial infarction involving left anterior descending coronary artery: Secondary | ICD-10-CM

## 2024-06-01 DIAGNOSIS — I2542 Coronary artery dissection: Secondary | ICD-10-CM

## 2024-06-01 DIAGNOSIS — Z955 Presence of coronary angioplasty implant and graft: Secondary | ICD-10-CM

## 2024-06-01 DIAGNOSIS — I214 Non-ST elevation (NSTEMI) myocardial infarction: Secondary | ICD-10-CM | POA: Diagnosis not present

## 2024-06-01 NOTE — Progress Notes (Signed)
 Daily Session Note  Patient Details  Name: VANNIA POLA MRN: [id number] Date of Birth: 08-14-1966 Referring Provider:   Flowsheet Row INTENSIVE CARDIAC REHAB ORIENT from 05/26/2024 in [facility]  Referring Provider Ladona Heinz, MD    Encounter Date: 06/01/2024  Check In:  Session Check In - 06/01/24 1121       Check-In   Supervising physician immediately available to respond to emergencies [facility] MD immediately available    Physician(s) Damien Burdock NP    Location [facility]    Staff Present Arnoldo Gal, MS, ACSM-CEP, Exercise Physiologist;Carlette Bernett, RN, Marcine Pereyra, MS, Exercise Physiologist;Johnny Fayette, MS, Exercise Physiologist;Jetta Vannie BS, ACSM-CEP, Exercise Physiologist;David Janann, MS, ACSM-CEP, CCRP, Exercise Physiologist;Kaylee Nicholaus, MS, ACSM-CEP, Exercise Physiologist    Virtual Visit No    Medication changes reported     No    Fall or balance concerns reported    No    Tobacco Cessation No Change    Warm-up and Cool-down Performed as group-led instruction    Resistance Training Performed Yes    VAD Patient? No    PAD/SET Patient? No      Pain Assessment   Currently in Pain? No/denies    Pain Score 0-No pain    Multiple Pain Sites No          Capillary Blood Glucose: No results found for this or any previous visit (from the past 24 hours).   Exercise Prescription Changes - 06/01/24 1028       Response to Exercise   Blood Pressure (Admit) 102/64    Blood Pressure (Exercise) 158/80    Blood Pressure (Exit) 112/70    Heart Rate (Admit) 59 bpm    Heart Rate (Exercise) 89 bpm    Heart Rate (Exit) 67 bpm    Rating of Perceived Exertion (Exercise) 13    Symptoms None    Comments Off to a great start with exercise.    Duration Continue with 30 min of aerobic exercise without signs/symptoms of physical distress.    Intensity THRR unchanged      Progression    Progression Continue to progress workloads to maintain intensity without signs/symptoms of physical distress.    Average METs 3.1      Resistance Training   Training Prescription Yes    Weight 3 lbs    Reps 10-15    Time 5 Minutes      Interval Training   Interval Training No      Treadmill   MPH 2.5    Grade 0    Minutes 15    METs 2.91      Recumbant Elliptical   Level 1    RPM 60    Watts 74    Minutes 15    METs 3.4          Social History   Tobacco Use  Smoking Status Never  Smokeless Tobacco Never    Goals Met:  Exercise tolerated well No report of concerns or symptoms today Strength training completed today  Goals Unmet:  Not Applicable  Comments: Lanyla started cardiac rehab today.  Pt tolerated light exercise without difficulty. VSS, telemetry-Sinus Rhythm, asymptomatic.  Medication list reconciled. Pt denies barriers to medicaiton compliance.  PSYCHOSOCIAL ASSESSMENT:  PHQ-10. Will review PHQ9 and quality of life questionnaire in the upcoming sessions.    Pt enjoys church and writing.   Pt oriented to exercise equipment and routine.  Understanding verbalized.Hadassah Elpidio Quan RN BSN    Dr. Wilbert Bihari is Medical Director for Cardiac Rehab at [facility].

## 2024-06-01 NOTE — Progress Notes (Signed)
 Daily Session Note  Patient Details  Name: Vanessa Wells MRN: [id number] Date of Birth: 01-Oct-1965 Referring Provider:   Flowsheet Row INTENSIVE CARDIAC REHAB ORIENT from 05/26/2024 in [facility]  Referring Provider Ladona Heinz, MD    Encounter Date: 06/01/2024  Check In:  Session Check In - 06/01/24 1121       Check-In   Supervising physician immediately available to respond to emergencies [facility] MD immediately available    Physician(s) Damien Burdock NP    Location [facility]    Staff Present Arnoldo Gal, MS, ACSM-CEP, Exercise Physiologist;Carlette Bernett, RN, Marcine Pereyra, MS, Exercise Physiologist;Johnny Fayette, MS, Exercise Physiologist;Jetta Vannie BS, ACSM-CEP, Exercise Physiologist;David Janann, MS, ACSM-CEP, CCRP, Exercise Physiologist;Kaylee Nicholaus, MS, ACSM-CEP, Exercise Physiologist    Virtual Visit No    Medication changes reported     No    Fall or balance concerns reported    No    Tobacco Cessation No Change    Warm-up and Cool-down Performed as group-led instruction    Resistance Training Performed Yes    VAD Patient? No    PAD/SET Patient? No      Pain Assessment   Currently in Pain? No/denies    Pain Score 0-No pain    Multiple Pain Sites No          Capillary Blood Glucose: No results found for this or any previous visit (from the past 24 hours).   Exercise Prescription Changes - 06/01/24 1028       Response to Exercise   Blood Pressure (Admit) 102/64    Blood Pressure (Exercise) 158/80    Blood Pressure (Exit) 112/70    Heart Rate (Admit) 59 bpm    Heart Rate (Exercise) 89 bpm    Heart Rate (Exit) 67 bpm    Rating of Perceived Exertion (Exercise) 13    Symptoms None    Comments Off to a great start with exercise.    Duration Continue with 30 min of aerobic exercise without signs/symptoms of physical distress.    Intensity THRR unchanged      Progression    Progression Continue to progress workloads to maintain intensity without signs/symptoms of physical distress.    Average METs 3.1      Resistance Training   Training Prescription Yes    Weight 3 lbs    Reps 10-15    Time 5 Minutes      Interval Training   Interval Training No      Treadmill   MPH 2.5    Grade 0    Minutes 15    METs 2.91      Recumbant Elliptical   Level 1    RPM 60    Watts 74    Minutes 15    METs 3.4          Social History   Tobacco Use  Smoking Status Never  Smokeless Tobacco Never    Goals Met:  Exercise tolerated well No report of concerns or symptoms today Strength training completed today  Goals Unmet:  Not Applicable  Comments: Dorinda tolerated low intensity exercise well without symptoms. Vital signs were within normal limits, telemetry normal sinus rhythm.   Dr. Wilbert Bihari is Medical Director for Cardiac Rehab at [facility].

## 2024-06-03 ENCOUNTER — Encounter (HOSPITAL_COMMUNITY)
Admission: RE | Admit: 2024-06-03 | Discharge: 2024-06-03 | Disposition: A | Discharge status: Home / Self Care | Source: Ambulatory Visit | Attending: Cardiology | Admitting: Cardiology

## 2024-06-03 DIAGNOSIS — I2102 ST elevation (STEMI) myocardial infarction involving left anterior descending coronary artery: Secondary | ICD-10-CM

## 2024-06-03 DIAGNOSIS — I2542 Coronary artery dissection: Secondary | ICD-10-CM

## 2024-06-03 DIAGNOSIS — Z955 Presence of coronary angioplasty implant and graft: Secondary | ICD-10-CM

## 2024-06-03 DIAGNOSIS — I214 Non-ST elevation (NSTEMI) myocardial infarction: Secondary | ICD-10-CM | POA: Diagnosis not present

## 2024-06-05 ENCOUNTER — Encounter (HOSPITAL_COMMUNITY)
Admission: RE | Admit: 2024-06-05 | Discharge: 2024-06-05 | Disposition: A | Discharge status: Home / Self Care | Source: Ambulatory Visit | Attending: Cardiology | Admitting: Cardiology

## 2024-06-05 ENCOUNTER — Telehealth (HOSPITAL_COMMUNITY): Payer: Self-pay | Admitting: *Deleted

## 2024-06-05 DIAGNOSIS — I2542 Coronary artery dissection: Secondary | ICD-10-CM

## 2024-06-05 DIAGNOSIS — I2102 ST elevation (STEMI) myocardial infarction involving left anterior descending coronary artery: Secondary | ICD-10-CM

## 2024-06-05 DIAGNOSIS — I214 Non-ST elevation (NSTEMI) myocardial infarction: Secondary | ICD-10-CM | POA: Diagnosis not present

## 2024-06-05 DIAGNOSIS — Z955 Presence of coronary angioplasty implant and graft: Secondary | ICD-10-CM

## 2024-06-05 NOTE — Telephone Encounter (Signed)
 Vanessa Wells left voice mail message at 8:58am informing us  she will not be able to attend cardiac rehab due to a family matter. Call her if you need to  [phone].

## 2024-06-08 ENCOUNTER — Encounter (HOSPITAL_COMMUNITY)
Admission: RE | Admit: 2024-06-08 | Discharge: 2024-06-08 | Disposition: A | Discharge status: Home / Self Care | Source: Ambulatory Visit | Attending: Cardiology | Admitting: Cardiology

## 2024-06-08 DIAGNOSIS — Z955 Presence of coronary angioplasty implant and graft: Secondary | ICD-10-CM

## 2024-06-08 DIAGNOSIS — I214 Non-ST elevation (NSTEMI) myocardial infarction: Secondary | ICD-10-CM | POA: Diagnosis not present

## 2024-06-08 DIAGNOSIS — I2102 ST elevation (STEMI) myocardial infarction involving left anterior descending coronary artery: Secondary | ICD-10-CM

## 2024-06-08 DIAGNOSIS — I2542 Coronary artery dissection: Secondary | ICD-10-CM

## 2024-06-10 ENCOUNTER — Encounter (HOSPITAL_COMMUNITY)
Admission: RE | Admit: 2024-06-10 | Discharge: 2024-06-10 | Disposition: A | Discharge status: Home / Self Care | Source: Ambulatory Visit | Attending: Cardiology | Admitting: Cardiology

## 2024-06-10 DIAGNOSIS — I2102 ST elevation (STEMI) myocardial infarction involving left anterior descending coronary artery: Secondary | ICD-10-CM | POA: Diagnosis present

## 2024-06-10 DIAGNOSIS — I2542 Coronary artery dissection: Secondary | ICD-10-CM | POA: Diagnosis present

## 2024-06-10 DIAGNOSIS — Z955 Presence of coronary angioplasty implant and graft: Secondary | ICD-10-CM | POA: Insufficient documentation

## 2024-06-12 ENCOUNTER — Encounter (HOSPITAL_COMMUNITY)
Admission: RE | Admit: 2024-06-12 | Discharge: 2024-06-12 | Disposition: A | Discharge status: Home / Self Care | Source: Ambulatory Visit | Attending: Cardiology | Admitting: Cardiology

## 2024-06-12 DIAGNOSIS — Z955 Presence of coronary angioplasty implant and graft: Secondary | ICD-10-CM

## 2024-06-12 DIAGNOSIS — I2102 ST elevation (STEMI) myocardial infarction involving left anterior descending coronary artery: Secondary | ICD-10-CM | POA: Diagnosis not present

## 2024-06-12 DIAGNOSIS — I2542 Coronary artery dissection: Secondary | ICD-10-CM

## 2024-06-15 ENCOUNTER — Encounter (HOSPITAL_COMMUNITY): Admission: RE | Admit: 2024-06-15 | Source: Ambulatory Visit

## 2024-06-16 NOTE — Progress Notes (Signed)
 Cardiac Individual Treatment Plan  Patient Details  Name: Vanessa Wells MRN: [id number] Date of Birth: 1965-12-22 Referring Provider:   Flowsheet Row INTENSIVE CARDIAC REHAB ORIENT from 05/26/2024 in [facility]  Referring Provider Ladona Heinz, MD    Initial Encounter Date:  Flowsheet Row INTENSIVE CARDIAC REHAB ORIENT from 05/26/2024 in [facility]  Date 05/26/24    Visit Diagnosis: 11/22/23 STEMI  11/22/23 SCAD  11/22/23 DES LAD  Patient's Home Medications on Admission:  Current Outpatient Medications:    atorvastatin  (LIPITOR) 80 MG tablet, Take 1 tablet (80 mg total) by mouth daily., Disp: 90 tablet, Rfl: 3   Cholecalciferol (TRUE VITAMIN D3) 1.25 MG (50000 UT) TABS, Take 50,000 Units by mouth every Sunday., Disp: , Rfl:    clopidogrel  (PLAVIX ) 75 MG tablet, Take 1 tablet (75 mg total) by mouth daily., Disp: 90 tablet, Rfl: 3   cyanocobalamin (VITAMIN B12) 1000 MCG tablet, Take 1 tablet (1,000 mcg total) by mouth daily., Disp: , Rfl:    metoprolol  succinate (TOPROL -XL) 50 MG 24 hr tablet, Take 1 tablet (50 mg total) by mouth daily., Disp: 90 tablet, Rfl: 3   nitroGLYCERIN  (NITROSTAT ) 0.4 MG SL tablet, Place 1 tablet (0.4 mg total) under the tongue every 5 (five) minutes as needed., Disp: 25 tablet, Rfl: 1   pantoprazole  (PROTONIX ) 40 MG tablet, Take 1 tablet (40 mg total) by mouth 2 (two) times daily. (Patient taking differently: Take 40 mg by mouth daily.), Disp: 60 tablet, Rfl: 2   sertraline  (ZOLOFT ) 100 MG tablet, Take 100 mg by mouth daily., Disp: , Rfl:   Past Medical History: Past Medical History:  Diagnosis Date   Anxiety    Diabetes mellitus    borderline   Hypercholesterolemia    Hypertension    Obesity     Tobacco Use: Social History   Tobacco Use  Smoking Status Never  Smokeless Tobacco Never    Labs: Review Flowsheet       Latest  Ref Rng & Units 10/18/2011 12/06/2023 01/15/2024  Labs for ITP Cardiac and Pulmonary Rehab  Cholestrol 100 - 199 mg/dL - - 874   LDL (calc) 0 - 99 mg/dL - - 52   HDL-C >60 mg/dL - - 61   Trlycerides 0 - 149 mg/dL - - 55   TCO2 22 - 32 mmol/L 26  24  -     Exercise Target Goals: Exercise Program Goal: Individual exercise prescription set using results from initial 6 min walk test and THRR while considering  patient's activity barriers and safety.   Exercise Prescription Goal: Initial exercise prescription builds to 30-45 minutes a day of aerobic activity, 2-3 days per week.  Home exercise guidelines will be given to patient during program as part of exercise prescription that the participant will acknowledge.   Education: Aerobic Exercise: - Group verbal and visual presentation on the components of exercise prescription. Introduces F.I.T.T principle from ACSM for exercise prescriptions.  Reviews F.I.T.T. principles of aerobic exercise including progression. Written material provided at class time.   Education: Resistance Exercise: - Group verbal and visual presentation on the components of exercise prescription. Introduces F.I.T.T principle from ACSM for exercise prescriptions  Reviews F.I.T.T. principles of resistance exercise including progression. Written material provided at class time.    Education: Exercise & Equipment Safety: - Individual verbal instruction and demonstration of equipment use and safety with use of the equipment.  Education: Exercise Physiology & General Exercise Guidelines: - Group verbal and written instruction with models to review the exercise physiology of the cardiovascular system and associated critical values. Provides general exercise guidelines with specific guidelines to those with heart or lung disease. Written material provided at class time.   Education: Flexibility, Balance, Mind/Body Relaxation: - Group verbal and visual presentation with  interactive activity on the components of exercise prescription. Introduces F.I.T.T principle from ACSM for exercise prescriptions. Reviews F.I.T.T. principles of flexibility and balance exercise training including progression. Also discusses the mind body connection.  Reviews various relaxation techniques to help reduce and manage stress (i.e. Deep breathing, progressive muscle relaxation, and visualization). Balance handout provided to take home. Written material provided at class time.   Activity Barriers & Risk Stratification:  Activity Barriers & Cardiac Risk Stratification - 05/26/24 1151       Activity Barriers & Cardiac Risk Stratification   Activity Barriers Balance Concerns;Other (comment)    Comments Flat feet, feels off balance at times.    Cardiac Risk Stratification High          6 Minute Walk:  6 Minute Walk     Row Name 05/26/24 1241         6 Minute Walk   Phase Initial     Distance 1476 feet     Walk Time 6 minutes     # of Rest Breaks 0     MPH 2.79     METS 3.63     RPE 11     Perceived Dyspnea  1     VO2 Peak 12.7     Symptoms Yes (comment)     Comments Mild shortness of breath.     Resting HR 56 bpm     Resting BP 134/80     Resting Oxygen Saturation  100 %     Exercise Oxygen Saturation  during 6 min walk 100 %     Max Ex. HR 85 bpm     Max Ex. BP 128/78     2 Minute Post BP 150/80        Oxygen Initial Assessment:   Oxygen Re-Evaluation:   Oxygen Discharge (Final Oxygen Re-Evaluation):   Initial Exercise Prescription:  Initial Exercise Prescription - 05/26/24 1400       Date of Initial Exercise RX and Referring Provider   Date 05/26/24    Referring Provider Ladona Heinz, MD    Expected Discharge Date 08/19/24      Treadmill   MPH 2.5    Grade 0    Minutes 15    METs 2.91      Recumbant Elliptical   Level 1    RPM 50    Watts 40    Minutes 15    METs 2.9      Prescription Details   Frequency (times per week) 3     Duration Progress to 30 minutes of continuous aerobic without signs/symptoms of physical distress      Intensity   THRR 40-80% of Max Heartrate 66-132    Ratings of Perceived Exertion 11-13    Perceived Dyspnea 0-4      Progression   Progression Continue to progress workloads to maintain intensity without signs/symptoms of physical distress.      Resistance Training   Training Prescription Yes    Weight 3 lbs    Reps 10-15          Perform Capillary Blood Glucose checks as needed.  Exercise Prescription Changes:   Exercise Prescription Changes     Row Name 06/01/24 1028 06/12/24 1039           Response to Exercise   Blood Pressure (Admit) 102/64 108/68      Blood Pressure (Exercise) 158/80 118/70      Blood Pressure (Exit) 112/70 112/68      Heart Rate (Admit) 59 bpm 62 bpm      Heart Rate (Exercise) 89 bpm 91 bpm      Heart Rate (Exit) 67 bpm 53 bpm      Rating of Perceived Exertion (Exercise) 13 11      Symptoms None None      Comments Off to a great start with exercise. --      Duration Continue with 30 min of aerobic exercise without signs/symptoms of physical distress. Continue with 30 min of aerobic exercise without signs/symptoms of physical distress.      Intensity THRR unchanged THRR unchanged        Progression   Progression Continue to progress workloads to maintain intensity without signs/symptoms of physical distress. Continue to progress workloads to maintain intensity without signs/symptoms of physical distress.      Average METs 3.1 3.3        Resistance Training   Training Prescription Yes Yes      Weight 3 lbs 3 lbs      Reps 10-15 10-15      Time 5 Minutes 5 Minutes        Interval Training   Interval Training No No        Treadmill   MPH 2.5 2.6      Grade 0 0      Minutes 15 15      METs 2.91 2.99        Recumbant Elliptical   Level 1 3      RPM 60 67      Watts 74 57      Minutes 15 15      METs 3.4 3.7         Exercise  Comments:   Exercise Comments     Row Name 06/01/24 1130           Exercise Comments Allesandra tolerated first session of exercise well without symptoms. She was oriented to the exercise equipment and stretching routine. Warm-up/ Cool-down handout given.          Exercise Goals and Review:   Exercise Goals     Row Name 05/26/24 1234             Exercise Goals   Increase Physical Activity Yes       Intervention Provide advice, education, support and counseling about physical activity/exercise needs.;Develop an individualized exercise prescription for aerobic and resistive training based on initial evaluation findings, risk stratification, comorbidities and participant's personal goals.       Expected Outcomes Short Term: Attend rehab on a regular basis to increase amount of physical activity.;Long Term: Exercising regularly at least 3-5 days a week.;Long Term: Add in home exercise to make exercise part of routine and to increase amount of physical activity.       Increase Strength and Stamina Yes       Intervention Provide advice, education, support and counseling about physical activity/exercise needs.;Develop an individualized exercise prescription for aerobic and resistive training based on initial evaluation findings, risk stratification, comorbidities and participant's personal goals.       Expected Outcomes  Short Term: Increase workloads from initial exercise prescription for resistance, speed, and METs.;Short Term: Perform resistance training exercises routinely during rehab and add in resistance training at home;Long Term: Improve cardiorespiratory fitness, muscular endurance and strength as measured by increased METs and functional capacity ( )       Able to understand and use rate of perceived exertion (RPE) scale Yes       Intervention Provide education and explanation on how to use RPE scale       Expected Outcomes Short Term: Able to use RPE daily in rehab to express  subjective intensity level;Long Term:  Able to use RPE to guide intensity level when exercising independently       Knowledge and understanding of Target Heart Rate Range (THRR) Yes       Intervention Provide education and explanation of THRR including how the numbers were predicted and where they are located for reference       Expected Outcomes Short Term: Able to state/look up THRR;Long Term: Able to use THRR to govern intensity when exercising independently;Short Term: Able to use daily as guideline for intensity in rehab       Able to check pulse independently Yes       Intervention Provide education and demonstration on how to check pulse in carotid and radial arteries.;Review the importance of being able to check your own pulse for safety during independent exercise       Expected Outcomes Long Term: Able to check pulse independently and accurately;Short Term: Able to explain why pulse checking is important during independent exercise       Understanding of Exercise Prescription Yes       Intervention Provide education, explanation, and written materials on patient's individual exercise prescription       Expected Outcomes Short Term: Able to explain program exercise prescription;Long Term: Able to explain home exercise prescription to exercise independently          Exercise Goals Re-Evaluation :  Exercise Goals Re-Evaluation     Row Name 06/01/24 1130             Exercise Goal Re-Evaluation   Exercise Goals Review Increase Physical Activity;Increase Strength and Stamina;Able to understand and use rate of perceived exertion (RPE) scale       Comments Matricia was able to understand and use RPE scale appropriately.       Expected Outcomes Progress workloads as tolerated to help increase cardiorespiratory fitness.          Discharge Exercise Prescription (Final Exercise Prescription Changes):  Exercise Prescription Changes - 06/12/24 1039       Response to Exercise   Blood  Pressure (Admit) 108/68    Blood Pressure (Exercise) 118/70    Blood Pressure (Exit) 112/68    Heart Rate (Admit) 62 bpm    Heart Rate (Exercise) 91 bpm    Heart Rate (Exit) 53 bpm    Rating of Perceived Exertion (Exercise) 11    Symptoms None    Duration Continue with 30 min of aerobic exercise without signs/symptoms of physical distress.    Intensity THRR unchanged      Progression   Progression Continue to progress workloads to maintain intensity without signs/symptoms of physical distress.    Average METs 3.3      Resistance Training   Training Prescription Yes    Weight 3 lbs    Reps 10-15    Time 5 Minutes      Interval Training  Interval Training No      Treadmill   MPH 2.6    Grade 0    Minutes 15    METs 2.99      Recumbant Elliptical   Level 3    RPM 67    Watts 57    Minutes 15    METs 3.7          Nutrition:  Target Goals: Understanding of nutrition guidelines, daily intake of sodium 1500mg , cholesterol 200mg , calories 30% from fat and 7% or less from saturated fats, daily to have 5 or more servings of fruits and vegetables.  Education: Nutrition 1 -Group instruction provided by verbal, written material, interactive activities, discussions, models, and posters to present general guidelines for heart healthy nutrition including macronutrients, label reading, and promoting whole foods over processed counterparts. Education serves as Pensions consultant of discussion of heart healthy eating for all. Written material provided at class time.    Education: Nutrition 2 -Group instruction provided by verbal, written material, interactive activities, discussions, models, and posters to present general guidelines for heart healthy nutrition including sodium, cholesterol, and saturated fat. Providing guidance of habit forming to improve blood pressure, cholesterol, and body weight. Written material provided at class time.     Biometrics:  Pre Biometrics - 05/26/24  1047       Pre Biometrics   Waist Circumference 39.75 inches    Hip Circumference 45 inches    Waist to Hip Ratio 0.88 %    Triceps Skinfold 24 mm    % Body Fat 40.1 %    Grip Strength 22 kg    Flexibility 16.5 in    Single Leg Stand 30 seconds           Nutrition Therapy Plan and Nutrition Goals:   Nutrition Assessments:  MEDIFICTS Score Key: >=70 Need to make dietary changes  40-70 Heart Healthy Diet <= 40 Therapeutic Level Cholesterol Diet   Picture Your Plate Scores: <59 Unhealthy dietary pattern with much room for improvement. 41-50 Dietary pattern unlikely to meet recommendations for good health and room for improvement. 51-60 More healthful dietary pattern, with some room for improvement.  >60 Healthy dietary pattern, although there may be some specific behaviors that could be improved.    Nutrition Goals Re-Evaluation:   Nutrition Goals Discharge (Final Nutrition Goals Re-Evaluation):   Psychosocial: Target Goals: Acknowledge presence or absence of significant depression and/or stress, maximize coping skills, provide positive support system. Participant is able to verbalize types and ability to use techniques and skills needed for reducing stress and depression.   Education: Stress, Anxiety, and Depression - Group verbal and visual presentation to define topics covered.  Reviews how body is impacted by stress, anxiety, and depression.  Also discusses healthy ways to reduce stress and to treat/manage anxiety and depression. Written material provided at class time.   Education: Sleep Hygiene -Provides group verbal and written instruction about how sleep can affect your health.  Define sleep hygiene, discuss sleep cycles and impact of sleep habits. Review good sleep hygiene tips.   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 She lost her car  but is saving for a new one. She previously was seen by a counselor but not since COVID. She would like a referral to a counselor.      Family Dynamics   Good Support System? Yes    Comments Nizhoni has gret support from her family especially a cousin who is like a sister to her. She has 4 sisters and 1 brother.      Barriers   Psychosocial barriers to participate in program Psychosocial barriers identified (see note);The patient should benefit from training in stress management and relaxation.      Screening Interventions   Interventions Encouraged to exercise;To provide support and resources with identified psychosocial needs;Provide feedback about the scores to participant    Expected Outcomes Short Term goal: Utilizing psychosocial counselor, staff and physician to assist with identification of specific Stressors or current issues interfering with healing process. Setting desired goal for each stressor or current issue identified.;Long Term Goal: Stressors or current issues are controlled or eliminated.;Short Term goal: Identification and review with participant of any Quality of Life or Depression concerns found by scoring the questionnaire.;Long Term goal: The participant improves quality of Life and PHQ9 Scores as seen by post scores and/or verbalization of changes          Quality of Life Scores:   Quality of Life - 05/26/24 1157       Quality of Life   Select Quality of Life      Quality of Life Scores   Health/Function Pre 19.83 %    Socioeconomic Pre 16.5 %    Psych/Spiritual Pre 19.64 %    Family Pre 13.5 %    GLOBAL Pre 18.18 %         Scores of 19 and below usually indicate a  poorer quality of life in these areas.  A difference of  2-3 points is a clinically meaningful difference.  A difference of 2-3 points in the total score of the Quality of Life Index has been associated with significant improvement in overall quality of life, self-image, physical symptoms, and general health in studies assessing change in quality of life.  PHQ-9: Review Flowsheet       05/26/2024 01/24/2018  Depression screen PHQ 2/9  Decreased Interest 1 0  Down, Depressed, Hopeless 1 0  PHQ - 2 Score 2 0  Altered sleeping 2 -  Tired, decreased energy 1 -  Change in appetite 2 -  Feeling bad or failure about yourself  1 -  Trouble concentrating 1 -  Moving slowly or fidgety/restless 1 -  Suicidal thoughts 0 -  PHQ-9 Score 10 -  Difficult doing work/chores Not difficult at all -   Interpretation of Total Score  Total Score Depression Severity:  1-4 = Minimal depression, 5-9 = Mild depression, 10-14 = Moderate depression, 15-19 = Moderately severe depression, 20-27 = Severe depression   Psychosocial Evaluation and Intervention:   Psychosocial Re-Evaluation:  Psychosocial Re-Evaluation     Row Name 06/02/24 1414 06/16/24 0859           Psychosocial Re-Evaluation   Current issues with Current Stress Concerns;Current Sleep Concerns;History of Depression;Current Depression Current Stress Concerns;Current Sleep Concerns;History of Depression;Current Depression      Comments Alexzandra did not voice any increased concerns or stressors on her first day of exercise. A counselling referal has been placed Reviewed PHQ9 . Jenna says that her energy level has increased since she has been participating in cardiac rehab. Joselle has had some chalenges  as she has had some challanges with her current living situation as Moesha is staying with family. Khiara was given website to [email or web address] com for transportation resources      Expected Outcomes Rylea will have controlled or decreased depression  upon completion of cardiac rehab Herman will have controlled or decreased depression upon completion of cardiac rehab      Interventions Stress management education;Relaxation education;Encouraged to attend Cardiac Rehabilitation for the exercise Stress management education;Relaxation education;Encouraged to attend Cardiac Rehabilitation for the exercise      Continue Psychosocial Services  No Follow up required No Follow up required        Initial Review   Source of Stress Concerns Financial;Transportation;Retirement/disability Financial;Transportation;Retirement/disability      Comments Will continue to monitor and offer support as needed Will continue to monitor and offer support as needed         Psychosocial Discharge (Final Psychosocial Re-Evaluation):  Psychosocial Re-Evaluation - 06/16/24 0859       Psychosocial Re-Evaluation   Current issues with Current Stress Concerns;Current Sleep Concerns;History of Depression;Current Depression    Comments Reviewed PHQ9 . Christna says that her energy level has increased since she has been participating in cardiac rehab. Mame has had some chalenges as she has had some challanges with her current living situation as Jerusalen is staying with family. Felina was given website to [email or web address] for transportation resources    Expected Outcomes Shauntia will have controlled or decreased depression upon completion of cardiac rehab    Interventions Stress management education;Relaxation education;Encouraged to attend Cardiac Rehabilitation for the exercise    Continue Psychosocial Services  No Follow up required      Initial Review   Source of Stress Concerns Financial;Transportation;Retirement/disability    Comments Will continue to monitor and offer support as needed          Vocational Rehabilitation: Provide vocational rehab assistance to qualifying candidates.   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          Education: Education Goals: Education classes will be provided on a variety of topics geared toward better understanding of heart health and risk factor modification. Participant will state understanding/return demonstration of topics presented as noted by education test scores.  Learning Barriers/Preferences:  Learning Barriers/Preferences - 05/26/24 1158       Learning Barriers/Preferences   Learning Barriers None    Learning Preferences Audio;Skilled Demonstration;Video;Individual Instruction;Group Instruction          General Cardiac Education Topics:  AED/CPR: - Group verbal and written instruction with the use of models to demonstrate the basic use of the AED with the basic ABC's of resuscitation.   Test and Procedures: - Group verbal and visual presentation and models provide information about basic cardiac anatomy and function. Reviews the testing methods done to diagnose heart disease and the outcomes of the test results. Describes the treatment choices: Medical Management, Angioplasty, or Coronary Bypass Surgery for treating various heart conditions including Myocardial Infarction, Angina, Valve Disease, and Cardiac Arrhythmias. Written material provided at class time.   Medication Safety: - Group verbal and visual instruction to review commonly prescribed medications  for heart and lung disease. Reviews the medication, class of the drug, and side effects. Includes the steps to properly store meds and maintain the prescription regimen. Written material provided at class time.   Intimacy: - Group verbal instruction through game format to discuss how  heart and lung disease can affect sexual intimacy. Written material provided at class time.   Know Your Numbers and Heart Failure: - Group verbal and visual instruction to discuss disease risk factors for cardiac and pulmonary disease and treatment options.  Reviews associated critical values for Overweight/Obesity, Hypertension, Cholesterol, and Diabetes.  Discusses basics of heart failure: signs/symptoms and treatments.  Introduces Heart Failure Zone chart for action plan for heart failure. Written material provided at class time.   Infection Prevention: - Provides verbal and written material to individual with discussion of infection control including proper hand washing and proper equipment cleaning during exercise session.   Falls Prevention: - Provides verbal and written material to individual with discussion of falls prevention and safety.   Other: -Provides group and verbal instruction on various topics (see comments)   Knowledge Questionnaire Score:  Knowledge Questionnaire Score - 05/26/24 1406       Knowledge Questionnaire Score   Pre Score 22/24          Core Components/Risk Factors/Patient Goals at Admission:  Personal Goals and Risk Factors at Admission - 05/26/24 1157       Core Components/Risk Factors/Patient Goals on Admission    Weight Management Yes;Obesity;Weight Loss    Intervention Weight Management/Obesity: Establish reasonable short term and long term weight goals.;Obesity: Provide education and appropriate resources to help participant work on and attain dietary goals.    Admit Weight 180 lb 12.4 oz (82 kg)    Expected Outcomes Short Term: Continue to assess and modify interventions until short term weight is achieved;Long Term: Adherence to nutrition and physical activity/exercise program aimed toward attainment of established weight goal;Weight Loss: Understanding of general recommendations for a balanced deficit meal plan, which promotes 1-2 lb weight  loss per week and includes a negative energy balance of [phone] kcal/d    Hypertension Yes    Intervention Provide education on lifestyle modifcations including regular physical activity/exercise, weight management, moderate sodium restriction and increased consumption of fresh fruit, vegetables, and low fat dairy, alcohol moderation, and smoking cessation.;Monitor prescription use compliance.    Expected Outcomes Short Term: Continued assessment and intervention until BP is < 140/78mm HG in hypertensive participants. < 130/49mm HG in hypertensive participants with diabetes, heart failure or chronic kidney disease.;Long Term: Maintenance of blood pressure at goal levels.    Lipids Yes    Intervention Provide education and support for participant on nutrition & aerobic/resistive exercise along with prescribed medications to achieve LDL 70mg , HDL >40mg .    Expected Outcomes Short Term: Participant states understanding of desired cholesterol values and is compliant with medications prescribed. Participant is following exercise prescription and nutrition guidelines.;Long Term: Cholesterol controlled with medications as prescribed, with individualized exercise RX and with personalized nutrition plan. Value goals: LDL < 70mg , HDL > 40 mg.    Stress Yes    Intervention Offer individual and/or small group education and counseling on adjustment to heart disease, stress management and health-related lifestyle change. Teach and support self-help strategies.;Refer participants experiencing significant psychosocial distress to appropriate mental health specialists for further evaluation and treatment. When possible, include family members and significant others in education/counseling sessions.    Expected Outcomes Short Term: Participant demonstrates changes in health-related behavior, relaxation  and other stress management skills, ability to obtain effective social support, and compliance with psychotropic  medications if prescribed.;Long Term: Emotional wellbeing is indicated by absence of clinically significant psychosocial distress or social isolation.          Education:Diabetes - Individual verbal and written instruction to review signs/symptoms of diabetes, desired ranges of glucose level fasting, after meals and with exercise. Acknowledge that pre and post exercise glucose checks will be done for 3 sessions at entry of program.   Core Components/Risk Factors/Patient Goals Review:   Goals and Risk Factor Review     Row Name 06/02/24 1425 06/16/24 0903           Core Components/Risk Factors/Patient Goals Review   Personal Goals Review Weight Management/Obesity;Hypertension;Lipids;Stress Weight Management/Obesity;Hypertension;Lipids;Stress      Review Elliett started cardiac rehab on 06/01/24. Lynnox did well with exercise. Vital signs were stable. Leanza started cardiac rehab on 06/01/24. Jenniger is off to a good start with exercise. Vital signs have been stable. Lashandra has increased her workloads.      Expected Outcomes Pocahontas will continue to participate in cardiac rehab for exercise, nutrition and lifestyle modifications Shatana will continue to participate in cardiac rehab for exercise, nutrition and lifestyle modifications         Core Components/Risk Factors/Patient Goals at Discharge (Final Review):   Goals and Risk Factor Review - 06/16/24 0903       Core Components/Risk Factors/Patient Goals Review   Personal Goals Review Weight Management/Obesity;Hypertension;Lipids;Stress    Review Monette started cardiac rehab on 06/01/24. Kestrel is off to a good start with exercise. Vital signs have been stable. Else has increased her workloads.    Expected Outcomes Ismahan will continue to participate in cardiac rehab for exercise, nutrition and lifestyle modifications          ITP Comments:  ITP Comments     Row Name 05/26/24 1047 06/01/24 1130 06/16/24  0858       ITP Comments Medical Director- Dr. Wilbert Bihari, MD. Introduction to the [facility] / Intensive Cardiac Rehab Program. Reviewed initial orientation folder with Delon. 30-Day ITP review. Marielis started the cardiac rehab program today and did very well. 30-Day ITP review. Amandajo has good attendance and participaiton with exercise at cardiac rehab        Comments: See ITP Comments

## 2024-06-17 ENCOUNTER — Encounter (HOSPITAL_COMMUNITY)
Admission: RE | Admit: 2024-06-17 | Discharge: 2024-06-17 | Disposition: A | Discharge status: Home / Self Care | Source: Ambulatory Visit | Attending: Cardiology | Admitting: Cardiology

## 2024-06-17 DIAGNOSIS — Z955 Presence of coronary angioplasty implant and graft: Secondary | ICD-10-CM

## 2024-06-17 DIAGNOSIS — I2542 Coronary artery dissection: Secondary | ICD-10-CM

## 2024-06-17 DIAGNOSIS — I2102 ST elevation (STEMI) myocardial infarction involving left anterior descending coronary artery: Secondary | ICD-10-CM

## 2024-06-19 ENCOUNTER — Encounter (HOSPITAL_COMMUNITY)
Admission: RE | Admit: 2024-06-19 | Discharge: 2024-06-19 | Disposition: A | Discharge status: Home / Self Care | Source: Ambulatory Visit | Attending: Cardiology | Admitting: Cardiology

## 2024-06-19 DIAGNOSIS — I2102 ST elevation (STEMI) myocardial infarction involving left anterior descending coronary artery: Secondary | ICD-10-CM

## 2024-06-19 DIAGNOSIS — I2542 Coronary artery dissection: Secondary | ICD-10-CM

## 2024-06-19 DIAGNOSIS — Z955 Presence of coronary angioplasty implant and graft: Secondary | ICD-10-CM

## 2024-06-19 NOTE — Progress Notes (Signed)
 Incomplete Session Note  Patient Details  Name: Vanessa Wells MRN: [id number] Date of Birth: February 27, 1966 Referring Provider:   Flowsheet Row INTENSIVE CARDIAC REHAB ORIENT from 05/26/2024 in [facility]  Referring Provider Ladona Heinz, MD    Vanessa Wells did not complete her rehab session.  C/o GI upset after having restarted her antidepressant medication that had run out.  Pt has left CR and plans to return on Monday.

## 2024-06-22 ENCOUNTER — Encounter (HOSPITAL_COMMUNITY)
Admission: RE | Admit: 2024-06-22 | Discharge: 2024-06-22 | Disposition: A | Discharge status: Home / Self Care | Source: Ambulatory Visit | Attending: Cardiology | Admitting: Cardiology

## 2024-06-22 DIAGNOSIS — Z955 Presence of coronary angioplasty implant and graft: Secondary | ICD-10-CM

## 2024-06-22 DIAGNOSIS — I2542 Coronary artery dissection: Secondary | ICD-10-CM

## 2024-06-22 DIAGNOSIS — I2102 ST elevation (STEMI) myocardial infarction involving left anterior descending coronary artery: Secondary | ICD-10-CM

## 2024-06-24 ENCOUNTER — Encounter (HOSPITAL_COMMUNITY)
Admission: RE | Admit: 2024-06-24 | Discharge: 2024-06-24 | Disposition: A | Discharge status: Home / Self Care | Source: Ambulatory Visit | Attending: Cardiology | Admitting: Cardiology

## 2024-06-24 DIAGNOSIS — I2102 ST elevation (STEMI) myocardial infarction involving left anterior descending coronary artery: Secondary | ICD-10-CM

## 2024-06-24 DIAGNOSIS — I2542 Coronary artery dissection: Secondary | ICD-10-CM

## 2024-06-24 DIAGNOSIS — Z955 Presence of coronary angioplasty implant and graft: Secondary | ICD-10-CM

## 2024-06-25 ENCOUNTER — Encounter: Payer: Self-pay | Admitting: Cardiology

## 2024-06-26 ENCOUNTER — Encounter: Payer: Self-pay | Admitting: Cardiology

## 2024-06-26 ENCOUNTER — Encounter (HOSPITAL_COMMUNITY)

## 2024-06-29 ENCOUNTER — Encounter (HOSPITAL_COMMUNITY): Admission: RE | Admit: 2024-06-29 | Source: Ambulatory Visit

## 2024-07-01 ENCOUNTER — Encounter (HOSPITAL_COMMUNITY)
Admission: RE | Admit: 2024-07-01 | Discharge: 2024-07-01 | Disposition: A | Discharge status: Home / Self Care | Source: Ambulatory Visit | Attending: Cardiology | Admitting: Cardiology

## 2024-07-01 DIAGNOSIS — I2102 ST elevation (STEMI) myocardial infarction involving left anterior descending coronary artery: Secondary | ICD-10-CM

## 2024-07-01 DIAGNOSIS — Z955 Presence of coronary angioplasty implant and graft: Secondary | ICD-10-CM

## 2024-07-01 DIAGNOSIS — I2542 Coronary artery dissection: Secondary | ICD-10-CM

## 2024-07-03 ENCOUNTER — Encounter (HOSPITAL_COMMUNITY)
Admission: RE | Admit: 2024-07-03 | Discharge: 2024-07-03 | Disposition: A | Discharge status: Home / Self Care | Source: Ambulatory Visit | Attending: Cardiology | Admitting: Cardiology

## 2024-07-03 DIAGNOSIS — Z955 Presence of coronary angioplasty implant and graft: Secondary | ICD-10-CM

## 2024-07-03 DIAGNOSIS — I2102 ST elevation (STEMI) myocardial infarction involving left anterior descending coronary artery: Secondary | ICD-10-CM | POA: Diagnosis not present

## 2024-07-03 DIAGNOSIS — I2542 Coronary artery dissection: Secondary | ICD-10-CM

## 2024-07-03 NOTE — Progress Notes (Signed)
 Reviewed home exercise guidelines with [facility] including endpoints, temperature precautions, target heart rate and rate of perceived exertion. She plans to walk 30 minutes, one day/week to establish a routine as her mode of home exercise. She will increase frequency as able. We discussed increasing duration in the future to help with weight loss. Vanessa Wells has exercise bands and an exercise ball that she wants to start using for her resistance training. We discussed starting with 1-3 sets of 10-15 repetitions 2-3 non-consecutive days/week for her resistance exercise. She has a pulse oximeter to monitor her pulse. Vanessa Wells voices understanding of instructions given.  Arnoldo CHRISTELLA Gal, MS, ACSM CEP

## 2024-07-06 ENCOUNTER — Encounter (HOSPITAL_COMMUNITY)
Admission: RE | Admit: 2024-07-06 | Discharge: 2024-07-06 | Disposition: A | Discharge status: Home / Self Care | Source: Ambulatory Visit | Attending: Cardiology | Admitting: Cardiology

## 2024-07-06 DIAGNOSIS — I2102 ST elevation (STEMI) myocardial infarction involving left anterior descending coronary artery: Secondary | ICD-10-CM

## 2024-07-06 DIAGNOSIS — I2542 Coronary artery dissection: Secondary | ICD-10-CM

## 2024-07-06 DIAGNOSIS — Z955 Presence of coronary angioplasty implant and graft: Secondary | ICD-10-CM

## 2024-07-08 ENCOUNTER — Encounter (HOSPITAL_COMMUNITY)
Admission: RE | Admit: 2024-07-08 | Discharge: 2024-07-08 | Disposition: A | Discharge status: Home / Self Care | Source: Ambulatory Visit | Attending: Cardiology | Admitting: Cardiology

## 2024-07-08 DIAGNOSIS — I2102 ST elevation (STEMI) myocardial infarction involving left anterior descending coronary artery: Secondary | ICD-10-CM | POA: Diagnosis not present

## 2024-07-08 DIAGNOSIS — Z955 Presence of coronary angioplasty implant and graft: Secondary | ICD-10-CM

## 2024-07-08 DIAGNOSIS — I2542 Coronary artery dissection: Secondary | ICD-10-CM

## 2024-07-09 ENCOUNTER — Ambulatory Visit: Admitting: Licensed Clinical Social Worker

## 2024-07-09 DIAGNOSIS — F411 Generalized anxiety disorder: Secondary | ICD-10-CM

## 2024-07-09 DIAGNOSIS — F331 Major depressive disorder, recurrent, moderate: Secondary | ICD-10-CM | POA: Diagnosis not present
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
## 2024-07-10 ENCOUNTER — Telehealth (HOSPITAL_COMMUNITY): Payer: Self-pay

## 2024-07-10 ENCOUNTER — Encounter (HOSPITAL_COMMUNITY): Admission: RE | Admit: 2024-07-10 | Source: Ambulatory Visit

## 2024-07-10 NOTE — Telephone Encounter (Signed)
 Patient c/o for 10:15 CR class due to work.

## 2024-07-10 NOTE — Telephone Encounter (Signed)
 Duplicate note

## 2024-07-13 ENCOUNTER — Telehealth (HOSPITAL_COMMUNITY): Payer: Self-pay

## 2024-07-13 ENCOUNTER — Encounter (HOSPITAL_COMMUNITY): Admission: RE | Admit: 2024-07-13

## 2024-07-13 NOTE — Telephone Encounter (Signed)
 Patient c/o for 10:15 CR class due to work.

## 2024-07-14 NOTE — Progress Notes (Signed)
 Cardiac Individual Treatment Plan  Patient Details  Name: TAUSHA MILHOAN MRN: [id number] Date of Birth: [birth year] Referring Provider:   Flowsheet Row INTENSIVE CARDIAC REHAB ORIENT from 05/26/2024 in [facility]  Referring Provider Ladona Heinz, MD    Initial Encounter Date:  Flowsheet Row INTENSIVE CARDIAC REHAB ORIENT from 05/26/2024 in [facility]  Date 05/26/24    Visit Diagnosis: 11/22/23 STEMI  11/22/23 SCAD  11/22/23 DES LAD  Patient's Home Medications on Admission:  Current Outpatient Medications:    atorvastatin  (LIPITOR) 80 MG tablet, Take 1 tablet (80 mg total) by mouth daily., Disp: 90 tablet, Rfl: 3   Cholecalciferol (TRUE VITAMIN D3) 1.25 MG (50000 UT) TABS, Take 50,000 Units by mouth every Sunday., Disp: , Rfl:    clopidogrel  (PLAVIX ) 75 MG tablet, Take 1 tablet (75 mg total) by mouth daily., Disp: 90 tablet, Rfl: 3   cyanocobalamin (VITAMIN B12) 1000 MCG tablet, Take 1 tablet (1,000 mcg total) by mouth daily., Disp: , Rfl:    metoprolol  succinate (TOPROL -XL) 50 MG 24 hr tablet, Take 1 tablet (50 mg total) by mouth daily., Disp: 90 tablet, Rfl: 3   nitroGLYCERIN  (NITROSTAT ) 0.4 MG SL tablet, Place 1 tablet (0.4 mg total) under the tongue every 5 (five) minutes as needed., Disp: 25 tablet, Rfl: 1   pantoprazole  (PROTONIX ) 40 MG tablet, Take 1 tablet (40 mg total) by mouth 2 (two) times daily. (Patient taking differently: Take 40 mg by mouth daily.), Disp: 60 tablet, Rfl: 2   sertraline  (ZOLOFT ) 100 MG tablet, Take 100 mg by mouth daily., Disp: , Rfl:   Past Medical History: Past Medical History:  Diagnosis Date   Anxiety    Diabetes mellitus    borderline   Hypercholesterolemia    Hypertension    Obesity     Tobacco Use: Social History   Tobacco Use  Smoking Status Never  Smokeless Tobacco Never    Labs: Review Flowsheet       Latest  Ref Rng & Units 10/18/2011 12/06/2023 01/15/2024  Labs for ITP Cardiac and Pulmonary Rehab  Cholestrol 100 - 199 mg/dL - - 874   LDL (calc) 0 - 99 mg/dL - - 52   HDL-C >60 mg/dL - - 61   Trlycerides 0 - 149 mg/dL - - 55   TCO2 22 - 32 mmol/L 26  24  -    Capillary Blood Glucose: Lab Results  Component Value Date   GLUCAP 92 12/10/2023   GLUCAP 92 12/06/2023     Exercise Target Goals: Exercise Program Goal: Individual exercise prescription set using results from initial 6 min walk test and THRR while considering  patient's activity barriers and safety.   Exercise Prescription Goal: Initial exercise prescription builds to 30-45 minutes a day of aerobic activity, 2-3 days per week.  Home exercise guidelines will be given to patient during program as part of exercise prescription that the participant will acknowledge.  Activity Barriers & Risk Stratification:  Activity Barriers & Cardiac Risk Stratification - 05/26/24 1151       Activity Barriers & Cardiac Risk Stratification   Activity Barriers Balance Concerns;Other (comment)    Comments Flat feet, feels off balance at times.    Cardiac Risk Stratification High          6 Minute Walk:  6 Minute Walk     Row Name 05/26/24 1241  6 Minute Walk   Phase Initial     Distance 1476 feet     Walk Time 6 minutes     # of Rest Breaks 0     MPH 2.79     METS 3.63     RPE 11     Perceived Dyspnea  1     VO2 Peak 12.7     Symptoms Yes (comment)     Comments Mild shortness of breath.     Resting HR 56 bpm     Resting BP 134/80     Resting Oxygen Saturation  100 %     Exercise Oxygen Saturation  during 6 min walk 100 %     Max Ex. HR 85 bpm     Max Ex. BP 128/78     2 Minute Post BP 150/80        Oxygen Initial Assessment:   Oxygen Re-Evaluation:   Oxygen Discharge (Final Oxygen Re-Evaluation):   Initial Exercise Prescription:  Initial Exercise Prescription - 05/26/24 1400       Date of Initial Exercise  RX and Referring Provider   Date 05/26/24    Referring Provider Ladona Heinz, MD    Expected Discharge Date 08/19/24      Treadmill   MPH 2.5    Grade 0    Minutes 15    METs 2.91      Recumbant Elliptical   Level 1    RPM 50    Watts 40    Minutes 15    METs 2.9      Prescription Details   Frequency (times per week) 3    Duration Progress to 30 minutes of continuous aerobic without signs/symptoms of physical distress      Intensity   THRR 40-80% of Max Heartrate 66-132    Ratings of Perceived Exertion 11-13    Perceived Dyspnea 0-4      Progression   Progression Continue to progress workloads to maintain intensity without signs/symptoms of physical distress.      Resistance Training   Training Prescription Yes    Weight 3 lbs    Reps 10-15          Perform Capillary Blood Glucose checks as needed.  Exercise Prescription Changes:   Exercise Prescription Changes     Row Name 06/01/24 1028 06/12/24 1039 07/01/24 1034 07/03/24 1027 07/08/24 1027     Response to Exercise   Blood Pressure (Admit) 102/64 108/68 124/70 100/64 120/60   Blood Pressure (Exercise) 158/80 118/70 -- 108/60 --   Blood Pressure (Exit) 112/70 112/68 104/76 102/60 114/60   Heart Rate (Admit) 59 bpm 62 bpm 69 bpm 87 bpm 56 bpm   Heart Rate (Exercise) 89 bpm 91 bpm 84 bpm 93 bpm 95 bpm   Heart Rate (Exit) 67 bpm 53 bpm 59 bpm 62 bpm 62 bpm   Rating of Perceived Exertion (Exercise) 13 11 11 11 13    Symptoms None None None None None   Comments Off to a great start with exercise. -- -- Reviewed home exercise guidelines and goals with Delon. --   Duration Continue with 30 min of aerobic exercise without signs/symptoms of physical distress. Continue with 30 min of aerobic exercise without signs/symptoms of physical distress. Continue with 30 min of aerobic exercise without signs/symptoms of physical distress. Continue with 30 min of aerobic exercise without signs/symptoms of physical distress.  Continue with 30 min of aerobic exercise without signs/symptoms of physical distress.  Intensity THRR unchanged THRR unchanged THRR unchanged THRR unchanged THRR unchanged     Progression   Progression Continue to progress workloads to maintain intensity without signs/symptoms of physical distress. Continue to progress workloads to maintain intensity without signs/symptoms of physical distress. Continue to progress workloads to maintain intensity without signs/symptoms of physical distress. Continue to progress workloads to maintain intensity without signs/symptoms of physical distress. Continue to progress workloads to maintain intensity without signs/symptoms of physical distress.   Average METs 3.1 3.3 3.7 3.3 4     Resistance Training   Training Prescription Yes Yes No Yes No   Weight 3 lbs 3 lbs Relaxation day, no weights 3 lbs right arm, 2 lbs left arm Relaxation day. No weights.   Reps 10-15 10-15 -- 10-15 --   Time 5 Minutes 5 Minutes -- 5 Minutes --     Interval Training   Interval Training No No No No No     Treadmill   MPH 2.5 2.6 2.6 2.6 2.8   Grade 0 0 0 0 1   Minutes 15 15 15 15 15    METs 2.91 2.99 2.99 2.99 3.53     Recumbant Elliptical   Level 1 3 3 3 3    RPM 60 67 68 55 68   Watts 74 57 99 84 103   Minutes 15 15 15 15 15    METs 3.4 3.7 4.5 3.7 4.4     Home Exercise Plan   Plans to continue exercise at -- -- -- Home (comment)  Walking Home (comment)  Walking   Frequency -- -- -- Add 1 additional day to program exercise sessions. Add 1 additional day to program exercise sessions.   Initial Home Exercises Provided -- -- -- 07/03/24 07/03/24      Exercise Comments:   Exercise Comments     Row Name 06/01/24 1130 07/03/24 1050         Exercise Comments Gabbi tolerated first session of exercise well without symptoms. She was oriented to the exercise equipment and stretching routine. Warm-up/ Cool-down handout given. Reviewed home exercise guidelines and goals  with Delon.         Exercise Goals and Review:   Exercise Goals     Row Name 05/26/24 1234             Exercise Goals   Increase Physical Activity Yes       Intervention Provide advice, education, support and counseling about physical activity/exercise needs.;Develop an individualized exercise prescription for aerobic and resistive training based on initial evaluation findings, risk stratification, comorbidities and participant's personal goals.       Expected Outcomes Short Term: Attend rehab on a regular basis to increase amount of physical activity.;Long Term: Exercising regularly at least 3-5 days a week.;Long Term: Add in home exercise to make exercise part of routine and to increase amount of physical activity.       Increase Strength and Stamina Yes       Intervention Provide advice, education, support and counseling about physical activity/exercise needs.;Develop an individualized exercise prescription for aerobic and resistive training based on initial evaluation findings, risk stratification, comorbidities and participant's personal goals.       Expected Outcomes Short Term: Increase workloads from initial exercise prescription for resistance, speed, and METs.;Short Term: Perform resistance training exercises routinely during rehab and add in resistance training at home;Long Term: Improve cardiorespiratory fitness, muscular endurance and strength as measured by increased METs and functional capacity ( )  Able to understand and use rate of perceived exertion (RPE) scale Yes       Intervention Provide education and explanation on how to use RPE scale       Expected Outcomes Short Term: Able to use RPE daily in rehab to express subjective intensity level;Long Term:  Able to use RPE to guide intensity level when exercising independently       Knowledge and understanding of Target Heart Rate Range (THRR) Yes       Intervention Provide education and explanation of THRR  including how the numbers were predicted and where they are located for reference       Expected Outcomes Short Term: Able to state/look up THRR;Long Term: Able to use THRR to govern intensity when exercising independently;Short Term: Able to use daily as guideline for intensity in rehab       Able to check pulse independently Yes       Intervention Provide education and demonstration on how to check pulse in carotid and radial arteries.;Review the importance of being able to check your own pulse for safety during independent exercise       Expected Outcomes Long Term: Able to check pulse independently and accurately;Short Term: Able to explain why pulse checking is important during independent exercise       Understanding of Exercise Prescription Yes       Intervention Provide education, explanation, and written materials on patient's individual exercise prescription       Expected Outcomes Short Term: Able to explain program exercise prescription;Long Term: Able to explain home exercise prescription to exercise independently          Exercise Goals Re-Evaluation :  Exercise Goals Re-Evaluation     Row Name 06/01/24 1130 07/01/24 1128 07/03/24 1050         Exercise Goal Re-Evaluation   Exercise Goals Review Increase Physical Activity;Increase Strength and Stamina;Able to understand and use rate of perceived exertion (RPE) scale Increase Physical Activity;Increase Strength and Stamina;Able to understand and use rate of perceived exertion (RPE) scale Increase Physical Activity;Increase Strength and Stamina;Able to understand and use rate of perceived exertion (RPE) scale;Knowledge and understanding of Target Heart Rate Range (THRR);Able to check pulse independently;Understanding of Exercise Prescription     Comments Makenize was able to understand and use RPE scale appropriately. Paeton is making steady progress with exercise. Increasing workloads approprriately. Reviewed exercise prescription  with Delon. Exercise has been hard for her to fit in due to taking classes and trying to find a job, which has been stressful for her. We discussed adding a 30-minute walk 1 day/week to start her home exercise program. She has exercise bands and an exercise ball that she wants to start using for her resistance exercise. We discussed 2-3 non-consecutive days for resistance, 10-15 reps, 2-3 sets as tolerated. She has a pulse oximeter that she can use to monitor her pulse. We discussed increasing her workloads at cardiac rehab to help build strength and stamina. Will increase both aerobic stations next week. Her potential job requires pushing/pulling 120lbs. We discussed possible starting the rowing machine in the future to help with upper body strength.     Expected Outcomes Progress workloads as tolerated to help increase cardiorespiratory fitness. Continue to progress workloads as tolerated. Mckinley will add walking 30 minutes at least 1 day /week at home to establish a routine.        Discharge Exercise Prescription (Final Exercise Prescription Changes):  Exercise Prescription Changes - 07/08/24 1027  Response to Exercise   Blood Pressure (Admit) 120/60    Blood Pressure (Exit) 114/60    Heart Rate (Admit) 56 bpm    Heart Rate (Exercise) 95 bpm    Heart Rate (Exit) 62 bpm    Rating of Perceived Exertion (Exercise) 13    Symptoms None    Duration Continue with 30 min of aerobic exercise without signs/symptoms of physical distress.    Intensity THRR unchanged      Progression   Progression Continue to progress workloads to maintain intensity without signs/symptoms of physical distress.    Average METs 4      Resistance Training   Training Prescription No    Weight Relaxation day. No weights.      Interval Training   Interval Training No      Treadmill   MPH 2.8    Grade 1    Minutes 15    METs 3.53      Recumbant Elliptical   Level 3    RPM 68    Watts 103    Minutes  15    METs 4.4      Home Exercise Plan   Plans to continue exercise at Home (comment)   Walking   Frequency Add 1 additional day to program exercise sessions.    Initial Home Exercises Provided 07/03/24          Nutrition:  Target Goals: Understanding of nutrition guidelines, daily intake of sodium 1500mg , cholesterol 200mg , calories 30% from fat and 7% or less from saturated fats, daily to have 5 or more servings of fruits and vegetables.  Biometrics:  Pre Biometrics - 05/26/24 1047       Pre Biometrics   Waist Circumference 39.75 inches    Hip Circumference 45 inches    Waist to Hip Ratio 0.88 %    Triceps Skinfold 24 mm    % Body Fat 40.1 %    Grip Strength 22 kg    Flexibility 16.5 in    Single Leg Stand 30 seconds           Nutrition Therapy Plan and Nutrition Goals:   Nutrition Assessments:  MEDIFICTS Score Key: >=70 Need to make dietary changes  40-70 Heart Healthy Diet <= 40 Therapeutic Level Cholesterol Diet    Picture Your Plate Scores: <59 Unhealthy dietary pattern with much room for improvement. 41-50 Dietary pattern unlikely to meet recommendations for good health and room for improvement. 51-60 More healthful dietary pattern, with some room for improvement.  >60 Healthy dietary pattern, although there may be some specific behaviors that could be improved.    Nutrition Goals Re-Evaluation:   Nutrition Goals Re-Evaluation:   Nutrition Goals Discharge (Final Nutrition Goals Re-Evaluation):   Psychosocial: Target Goals: Acknowledge presence or absence of significant depression and/or stress, maximize coping skills, provide positive support system. Participant is able to verbalize types and ability to use techniques and skills needed for reducing stress and depression.  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 She lost her car but is saving for a new one. She previously was seen by a counselor but not  since COVID. She would like a referral to a counselor.      Family Dynamics   Good Support System? Yes    Comments Seynabou has gret support from her family especially a cousin who is like a sister to her. She has 4 sisters and 1 brother.      Barriers   Psychosocial barriers to participate in program Psychosocial barriers identified (see note);The patient should benefit from training in stress management and relaxation.      Screening Interventions   Interventions Encouraged to exercise;To provide support and resources with identified psychosocial needs;Provide feedback about the scores to participant    Expected Outcomes Short Term goal: Utilizing psychosocial counselor, staff and physician to assist with identification of specific Stressors or current issues interfering with healing process. Setting desired goal for each stressor or current issue identified.;Long Term Goal: Stressors or current issues are controlled or eliminated.;Short Term goal: Identification and review with participant of any Quality of Life or Depression concerns found by scoring the questionnaire.;Long Term goal: The participant improves quality of Life and PHQ9 Scores as seen by post scores and/or verbalization of changes          Quality of Life Scores:  Quality of Life - 05/26/24 1157       Quality of Life   Select Quality of Life      Quality of Life Scores   Health/Function Pre 19.83 %    Socioeconomic Pre 16.5 %    Psych/Spiritual Pre 19.64 %    Family Pre 13.5 %    GLOBAL Pre 18.18 %         Scores of 19 and below usually indicate a poorer  quality of life in these areas.  A difference of  2-3 points is a clinically meaningful difference.  A difference of 2-3 points in the total score of the Quality of Life Index has been associated with significant improvement in overall quality of life, self-image, physical symptoms, and general health in studies assessing change in quality of life.  PHQ-9: Review Flowsheet       05/26/2024 01/24/2018  Depression screen PHQ 2/9  Decreased Interest 1 0  Down, Depressed, Hopeless 1 0  PHQ - 2 Score 2 0  Altered sleeping 2 -  Tired, decreased energy 1 -  Change in appetite 2 -  Feeling bad or failure about yourself  1 -  Trouble concentrating 1 -  Moving slowly or fidgety/restless 1 -  Suicidal thoughts 0 -  PHQ-9 Score 10 -  Difficult doing work/chores Not difficult at all -   Interpretation of Total Score  Total Score Depression Severity:  1-4 = Minimal depression, 5-9 = Mild depression, 10-14 = Moderate depression, 15-19 = Moderately severe depression, 20-27 = Severe depression   Psychosocial Evaluation and Intervention:   Psychosocial Re-Evaluation:  Psychosocial Re-Evaluation     Row Name 06/02/24 1414 06/16/24 0859 07/14/24 0746         Psychosocial Re-Evaluation   Current issues with Current Stress Concerns;Current Sleep Concerns;History of Depression;Current Depression Current Stress Concerns;Current Sleep Concerns;History of Depression;Current Depression Current Stress Concerns;Current Sleep Concerns;History of Depression;Current Depression (P)      Comments Karesha did not voice any increased concerns or stressors on her first day of exercise. A counselling referal has been placed Reviewed PHQ9 . Kellyjo says that her energy level has increased since she has been participating in cardiac rehab. Zyanne has had some chalenges as she has had some challanges with her  current living situation as Maylea is staying with family. Eyanna was given website to [employer] com for  transportation resources ReviewedJennifer is in the process of receiving counseling. Christina has had some challenges financially and is currently staying with family. Kinsie has returned to work. Liahna has had some challenges dealing with family. Shanicka says that cardiac rehab has been helpful for her. (P)      Expected Outcomes Perle will have controlled or decreased depression upon completion of cardiac rehab Muranda will have controlled or decreased depression upon completion of cardiac rehab Syenna will have controlled or decreased depression upon completion of cardiac rehab (P)      Interventions Stress management education;Relaxation education;Encouraged to attend Cardiac Rehabilitation for the exercise Stress management education;Relaxation education;Encouraged to attend Cardiac Rehabilitation for the exercise Stress management education;Relaxation education;Encouraged to attend Cardiac Rehabilitation for the exercise (P)      Continue Psychosocial Services  No Follow up required No Follow up required No Follow up required (P)        Initial Review   Source of Stress Concerns Financial;Transportation;Retirement/disability Financial;Transportation;Retirement/disability Financial;Transportation;Retirement/disability (P)      Comments Will continue to monitor and offer support as needed Will continue to monitor and offer support as needed Will continue to monitor and offer support as needed (P)         Psychosocial Discharge (Final Psychosocial Re-Evaluation):  Psychosocial Re-Evaluation - 07/14/24 0746       Psychosocial Re-Evaluation   Current issues with Current Stress Concerns;Current Sleep Concerns;History of Depression;Current Depression (P)     Comments ReviewedJennifer is in the process of receiving counseling. Belissa has had some challenges financially and is currently staying with family. Tamella has returned to work. Devita has had some challenges dealing with  family. Joeann says that cardiac rehab has been helpful for her. (P)     Expected Outcomes Chazlyn will have controlled or decreased depression upon completion of cardiac rehab (P)     Interventions Stress management education;Relaxation education;Encouraged to attend Cardiac Rehabilitation for the exercise (P)     Continue Psychosocial Services  No Follow up required (P)       Initial Review   Source of Stress Concerns Financial;Transportation;Retirement/disability (P)     Comments Will continue to monitor and offer support as needed (P)           Vocational Rehabilitation: Provide vocational rehab assistance to qualifying candidates.   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          Education: Education Goals: Education classes will be provided on a weekly basis, covering required topics. Participant will state understanding/return demonstration of topics presented.    Education     Row Name 06/01/24 1200     Education   Cardiac Education Topics Pritikin   Psychologist, Forensic Exercise Education   Exercise Education Improving Performance   Instruction Review Code 1- Verbalizes Understanding   Class Start Time 1150   Class Stop Time 1230   Class Time Calculation (min) 40 min    Row Name 06/03/24 1000  Education   Cardiac Education Topics Pritikin   Customer Service Manager   Weekly Topic International Cuisine- Spotlight on the [facility] Zones   Instruction Review Code 1- Verbalizes Understanding    Row Name 06/05/24 1000      Education   Cardiac Education Topics Pritikin   Glass Blower/designer Nutrition   Nutrition Workshop Fueling a Forensic Psychologist   Class Start Time 1157   Class Stop Time 1230   Class Time Calculation (min) 33 min    Row Name 06/05/24 1000     Education   Cardiac Education Topics Pritikin   Glass Blower/designer Nutrition   Nutrition Workshop Fueling a Forensic Psychologist   Instruction Review Code 1- Tefl Teacher Understanding    Row Name 06/10/24 1100     Education   Cardiac Education Topics Pritikin   Chiropodist   Instruction Review Code 1- Verbalizes Understanding   Class Start Time 1155   Class Stop Time 1230   Class Time Calculation (min) 35 min    Row Name 06/12/24 1100     Education   Cardiac Education Topics Pritikin   Psychologist, Forensic Psychosocial   Psychosocial How Our Thoughts Can Heal Our Hearts   Instruction Review Code 1- Verbalizes Understanding   Class Start Time 1200   Class Stop Time 1240   Class Time Calculation (min) 40 min    Row Name 06/17/24 1100     Education   Cardiac Education Topics Pritikin   Customer Service Manager   Weekly Topic Powerhouse Plant-Based Proteins   Instruction Review Code 1- Verbalizes Understanding   Class Start Time 1145   Class Stop Time 1216   Class Time Calculation (min) 31 min    Row Name 06/22/24 1000     Education   Cardiac Education Topics Pritikin   Geographical Information Systems Officer Psychosocial   Psychosocial Workshop From Head to Heart: The Power of a Healthy Outlook   Instruction Review Code 1- Verbalizes Understanding   Class Start Time 1150   Class Stop Time 1230   Class Time Calculation (min)  40 min    Row Name 07/01/24 1100     Education   Cardiac Education Topics Pritikin   Orthoptist   Educator Dietitian   Weekly Topic Adding Flavor - Sodium-Free   Instruction Review Code 1- Verbalizes Understanding   Class Start Time 1145   Class Stop Time 1223   Class Time Calculation (min) 38 min    Row Name 07/03/24 1100     Education   Cardiac Education Topics Pritikin   Glass Blower/designer Nutrition   Nutrition Workshop Label Reading   Instruction Review Code 1- Verbalizes Understanding   Class Start Time 1145   Class Stop Time 1220   Class Time Calculation (min) 35 min    Row Name 07/08/24 1100     Education  Cardiac Education Topics Pritikin   Orthoptist   Educator Nurse;Respiratory Therapist   Weekly Topic Fast and Healthy Breakfasts   Instruction Review Code 1- Verbalizes Understanding   Class Start Time 1145   Class Stop Time 1225   Class Time Calculation (min) 40 min      Core Videos: Exercise    Move It!  Clinical staff conducted group or individual video education with verbal and written material and guidebook.  Patient learns the recommended Pritikin exercise program. Exercise with the goal of living a long, healthy life. Some of the health benefits of exercise include controlled diabetes, healthier blood pressure levels, improved cholesterol levels, improved heart and lung capacity, improved sleep, and better body composition. Everyone should speak with their doctor before starting or changing an exercise routine.  Biomechanical Limitations Clinical staff conducted group or individual video education with verbal and written material and guidebook.  Patient learns how biomechanical limitations can impact exercise and how we can mitigate and possibly overcome limitations to have an impactful and balanced exercise routine.  Body  Composition Clinical staff conducted group or individual video education with verbal and written material and guidebook.  Patient learns that body composition (ratio of muscle mass to fat mass) is a key component to assessing overall fitness, rather than body weight alone. Increased fat mass, especially visceral belly fat, can put us  at increased risk for metabolic syndrome, type 2 diabetes, heart disease, and even death. It is recommended to combine diet and exercise (cardiovascular and resistance training) to improve your body composition. Seek guidance from your physician and exercise physiologist before implementing an exercise routine.  Exercise Action Plan Clinical staff conducted group or individual video education with verbal and written material and guidebook.  Patient learns the recommended strategies to achieve and enjoy long-term exercise adherence, including variety, self-motivation, self-efficacy, and positive decision making. Benefits of exercise include fitness, good health, weight management, more energy, better sleep, less stress, and overall well-being.  Medical   Heart Disease Risk Reduction Clinical staff conducted group or individual video education with verbal and written material and guidebook.  Patient learns our heart is our most vital organ as it circulates oxygen, nutrients, white blood cells, and hormones throughout the entire body, and carries waste away. Data supports a plant-based eating plan like the Pritikin Program for its effectiveness in slowing progression of and reversing heart disease. The video provides a number of recommendations to address heart disease.   Metabolic Syndrome and Belly Fat  Clinical staff conducted group or individual video education with verbal and written material and guidebook.  Patient learns what metabolic syndrome is, how it leads to heart disease, and how one can reverse it and keep it from coming back. You have metabolic syndrome if  you have 3 of the following 5 criteria: abdominal obesity, high blood pressure, high triglycerides, low HDL cholesterol, and high blood sugar.  Hypertension and Heart Disease Clinical staff conducted group or individual video education with verbal and written material and guidebook.  Patient learns that high blood pressure, or hypertension, is very common in the [place] . Hypertension is largely due to excessive salt intake, but other important risk factors include being overweight, physical inactivity, drinking too much alcohol, smoking, and not eating enough potassium from fruits and vegetables. High blood pressure is a leading risk factor for heart attack, stroke, congestive heart failure, dementia, kidney failure, and premature death. Long-term effects of excessive salt intake include  stiffening of the arteries and thickening of heart muscle and organ damage. Recommendations include ways to reduce hypertension and the risk of heart disease.  Diseases of Our Time - Focusing on Diabetes Clinical staff conducted group or individual video education with verbal and written material and guidebook.  Patient learns why the best way to stop diseases of our time is prevention, through food and other lifestyle changes. Medicine (such as prescription pills and surgeries) is often only a Band-Aid on the problem, not a long-term solution. Most common diseases of our time include obesity, type 2 diabetes, hypertension, heart disease, and cancer. The Pritikin Program is recommended and has been proven to help reduce, reverse, and/or prevent the damaging effects of metabolic syndrome.  Nutrition   Overview of the Pritikin Eating Plan  Clinical staff conducted group or individual video education with verbal and written material and guidebook.  Patient learns about the Pritikin Eating Plan for disease risk reduction. The Pritikin Eating Plan emphasizes a wide variety of unrefined, minimally-processed  carbohydrates, like fruits, vegetables, whole grains, and legumes. Go, Caution, and Stop food choices are explained. Plant-based and lean animal proteins are emphasized. Rationale provided for low sodium intake for blood pressure control, low added sugars for blood sugar stabilization, and low added fats and oils for coronary artery disease risk reduction and weight management.  Calorie Density  Clinical staff conducted group or individual video education with verbal and written material and guidebook.  Patient learns about calorie density and how it impacts the Pritikin Eating Plan. Knowing the characteristics of the food you choose will help you decide whether those foods will lead to weight gain or weight loss, and whether you want to consume more or less of them. Weight loss is usually a side effect of the Pritikin Eating Plan because of its focus on low calorie-dense foods.  Label Reading  Clinical staff conducted group or individual video education with verbal and written material and guidebook.  Patient learns about the Pritikin recommended label reading guidelines and corresponding recommendations regarding calorie density, added sugars, sodium content, and whole grains.  Dining Out - Part 1  Clinical staff conducted group or individual video education with verbal and written material and guidebook.  Patient learns that restaurant meals can be sabotaging because they can be so high in calories, fat, sodium, and/or sugar. Patient learns recommended strategies on how to positively address this and avoid unhealthy pitfalls.  Facts on Fats  Clinical staff conducted group or individual video education with verbal and written material and guidebook.  Patient learns that lifestyle modifications can be just as effective, if not more so, as many medications for lowering your risk of heart disease. A Pritikin lifestyle can help to reduce your risk of inflammation and atherosclerosis (cholesterol  build-up, or plaque, in the artery walls). Lifestyle interventions such as dietary choices and physical activity address the cause of atherosclerosis. A review of the types of fats and their impact on blood cholesterol levels, along with dietary recommendations to reduce fat intake is also included.  Nutrition Action Plan  Clinical staff conducted group or individual video education with verbal and written material and guidebook.  Patient learns how to incorporate Pritikin recommendations into their lifestyle. Recommendations include planning and keeping personal health goals in mind as an important part of their success.  Healthy Mind-Set    Healthy Minds, Bodies, Hearts  Clinical staff conducted group or individual video education with verbal and written material and guidebook.  Patient learns how  to identify when they are stressed. Video will discuss the impact of that stress, as well as the many benefits of stress management. Patient will also be introduced to stress management techniques. The way we think, act, and feel has an impact on our hearts.  How Our Thoughts Can Heal Our Hearts  Clinical staff conducted group or individual video education with verbal and written material and guidebook.  Patient learns that negative thoughts can cause depression and anxiety. This can result in negative lifestyle behavior and serious health problems. Cognitive behavioral therapy is an effective method to help control our thoughts in order to change and improve our emotional outlook.  Additional Videos:  Exercise    Improving Performance  Clinical staff conducted group or individual video education with verbal and written material and guidebook.  Patient learns to use a non-linear approach by alternating intensity levels and lengths of time spent exercising to help burn more calories and lose more body fat. Cardiovascular exercise helps improve heart health, metabolism, hormonal balance, blood sugar  control, and recovery from fatigue. Resistance training improves strength, endurance, balance, coordination, reaction time, metabolism, and muscle mass. Flexibility exercise improves circulation, posture, and balance. Seek guidance from your physician and exercise physiologist before implementing an exercise routine and learn your capabilities and proper form for all exercise.  Introduction to Yoga  Clinical staff conducted group or individual video education with verbal and written material and guidebook.  Patient learns about yoga, a discipline of the coming together of mind, breath, and body. The benefits of yoga include improved flexibility, improved range of motion, better posture and core strength, increased lung function, weight loss, and positive self-image. Yoga's heart health benefits include lowered blood pressure, healthier heart rate, decreased cholesterol and triglyceride levels, improved immune function, and reduced stress. Seek guidance from your physician and exercise physiologist before implementing an exercise routine and learn your capabilities and proper form for all exercise.  Medical   Aging: Enhancing Your Quality of Life  Clinical staff conducted group or individual video education with verbal and written material and guidebook.  Patient learns key strategies and recommendations to stay in good physical health and enhance quality of life, such as prevention strategies, having an advocate, securing a Health Care Proxy and Power of Attorney, and keeping a list of medications and system for tracking them. It also discusses how to avoid risk for bone loss.  Biology of Weight Control  Clinical staff conducted group or individual video education with verbal and written material and guidebook.  Patient learns that weight gain occurs because we consume more calories than we burn (eating more, moving less). Even if your body weight is normal, you may have higher ratios of fat compared to  muscle mass. Too much body fat puts you at increased risk for cardiovascular disease, heart attack, stroke, type 2 diabetes, and obesity-related cancers. In addition to exercise, following the Pritikin Eating Plan can help reduce your risk.  Decoding Lab Results  Clinical staff conducted group or individual video education with verbal and written material and guidebook.  Patient learns that lab test reflects one measurement whose values change over time and are influenced by many factors, including medication, stress, sleep, exercise, food, hydration, pre-existing medical conditions, and more. It is recommended to use the knowledge from this video to become more involved with your lab results and evaluate your numbers to speak with your doctor.   Diseases of Our Time - Overview  Clinical staff conducted group or individual  video education with verbal and written material and guidebook.  Patient learns that according to the CDC, 50% to 70% of chronic diseases (such as obesity, type 2 diabetes, elevated lipids, hypertension, and heart disease) are avoidable through lifestyle improvements including healthier food choices, listening to satiety cues, and increased physical activity.  Sleep Disorders Clinical staff conducted group or individual video education with verbal and written material and guidebook.  Patient learns how good quality and duration of sleep are important to overall health and well-being. Patient also learns about sleep disorders and how they impact health along with recommendations to address them, including discussing with a physician.  Nutrition  Dining Out - Part 2 Clinical staff conducted group or individual video education with verbal and written material and guidebook.  Patient learns how to plan ahead and communicate in order to maximize their dining experience in a healthy and nutritious manner. Included are recommended food choices based on the type of restaurant the patient  is visiting.   Fueling a Banker conducted group or individual video education with verbal and written material and guidebook.  There is a strong connection between our food choices and our health. Diseases like obesity and type 2 diabetes are very prevalent and are in large-part due to lifestyle choices. The Pritikin Eating Plan provides plenty of food and hunger-curbing satisfaction. It is easy to follow, affordable, and helps reduce health risks.  Menu Workshop  Clinical staff conducted group or individual video education with verbal and written material and guidebook.  Patient learns that restaurant meals can sabotage health goals because they are often packed with calories, fat, sodium, and sugar. Recommendations include strategies to plan ahead and to communicate with the manager, chef, or server to help order a healthier meal.  Planning Your Eating Strategy  Clinical staff conducted group or individual video education with verbal and written material and guidebook.  Patient learns about the Pritikin Eating Plan and its benefit of reducing the risk of disease. The Pritikin Eating Plan does not focus on calories. Instead, it emphasizes high-quality, nutrient-rich foods. By knowing the characteristics of the foods, we choose, we can determine their calorie density and make informed decisions.  Targeting Your Nutrition Priorities  Clinical staff conducted group or individual video education with verbal and written material and guidebook.  Patient learns that lifestyle habits have a tremendous impact on disease risk and progression. This video provides eating and physical activity recommendations based on your personal health goals, such as reducing LDL cholesterol, losing weight, preventing or controlling type 2 diabetes, and reducing high blood pressure.  Vitamins and Minerals  Clinical staff conducted group or individual video education with verbal and written material  and guidebook.  Patient learns different ways to obtain key vitamins and minerals, including through a recommended healthy diet. It is important to discuss all supplements you take with your doctor.   Healthy Mind-Set    Smoking Cessation  Clinical staff conducted group or individual video education with verbal and written material and guidebook.  Patient learns that cigarette smoking and tobacco addiction pose a serious health risk which affects millions of people. Stopping smoking will significantly reduce the risk of heart disease, lung disease, and many forms of cancer. Recommended strategies for quitting are covered, including working with your doctor to develop a successful plan.  Culinary   Becoming a Set Designer conducted group or individual video education with verbal and written material and guidebook.  Patient learns  that cooking at home can be healthy, cost-effective, quick, and puts them in control. Keys to cooking healthy recipes will include looking at your recipe, assessing your equipment needs, planning ahead, making it simple, choosing cost-effective seasonal ingredients, and limiting the use of added fats, salts, and sugars.  Cooking - Breakfast and Snacks  Clinical staff conducted group or individual video education with verbal and written material and guidebook.  Patient learns how important breakfast is to satiety and nutrition through the entire day. Recommendations include key foods to eat during breakfast to help stabilize blood sugar levels and to prevent overeating at meals later in the day. Planning ahead is also a key component.  Cooking - Educational Psychologist conducted group or individual video education with verbal and written material and guidebook.  Patient learns eating strategies to improve overall health, including an approach to cook more at home. Recommendations include thinking of animal protein as a side on your plate rather  than center stage and focusing instead on lower calorie dense options like vegetables, fruits, whole grains, and plant-based proteins, such as beans. Making sauces in large quantities to freeze for later and leaving the skin on your vegetables are also recommended to maximize your experience.  Cooking - Healthy Salads and Dressing Clinical staff conducted group or individual video education with verbal and written material and guidebook.  Patient learns that vegetables, fruits, whole grains, and legumes are the foundations of the Pritikin Eating Plan. Recommendations include how to incorporate each of these in flavorful and healthy salads, and how to create homemade salad dressings. Proper handling of ingredients is also covered. Cooking - Soups and [employer] - Soups and Desserts Clinical staff conducted group or individual video education with verbal and written material and guidebook.  Patient learns that Pritikin soups and desserts make for easy, nutritious, and delicious snacks and meal components that are low in sodium, fat, sugar, and calorie density, while high in vitamins, minerals, and filling fiber. Recommendations include simple and healthy ideas for soups and desserts.   Overview     The Pritikin Solution Program Overview Clinical staff conducted group or individual video education with verbal and written material and guidebook.  Patient learns that the results of the Pritikin Program have been documented in more than 100 articles published in peer-reviewed journals, and the benefits include reducing risk factors for (and, in some cases, even reversing) high cholesterol, high blood pressure, type 2 diabetes, obesity, and more! An overview of the three key pillars of the Pritikin Program will be covered: eating well, doing regular exercise, and having a healthy mind-set.  WORKSHOPS  Exercise: Exercise Basics: Building Your Action Plan Clinical staff led group instruction and  group discussion with PowerPoint presentation and patient guidebook. To enhance the learning environment the use of posters, models and videos may be added. At the conclusion of this workshop, patients will comprehend the difference between physical activity and exercise, as well as the benefits of incorporating both, into their routine. Patients will understand the FITT (Frequency, Intensity, Time, and Type) principle and how to use it to build an exercise action plan. In addition, safety concerns and other considerations for exercise and cardiac rehab will be addressed by the presenter. The purpose of this lesson is to promote a comprehensive and effective weekly exercise routine in order to improve patients' overall level of fitness.   Managing Heart Disease: Your Path to a Healthier Heart Clinical staff led group instruction and group  discussion with PowerPoint presentation and patient guidebook. To enhance the learning environment the use of posters, models and videos may be added.At the conclusion of this workshop, patients will understand the anatomy and physiology of the heart. Additionally, they will understand how Pritikin's three pillars impact the risk factors, the progression, and the management of heart disease.  The purpose of this lesson is to provide a high-level overview of the heart, heart disease, and how the Pritikin lifestyle positively impacts risk factors.  Exercise Biomechanics Clinical staff led group instruction and group discussion with PowerPoint presentation and patient guidebook. To enhance the learning environment the use of posters, models and videos may be added. Patients will learn how the structural parts of their bodies function and how these functions impact their daily activities, movement, and exercise. Patients will learn how to promote a neutral spine, learn how to manage pain, and identify ways to improve their physical movement in order to  promote healthy living. The purpose of this lesson is to expose patients to common physical limitations that impact physical activity. Participants will learn practical ways to adapt and manage aches and pains, and to minimize their effect on regular exercise. Patients will learn how to maintain good posture while sitting, walking, and lifting.  Balance Training and Fall Prevention  Clinical staff led group instruction and group discussion with PowerPoint presentation and patient guidebook. To enhance the learning environment the use of posters, models and videos may be added. At the conclusion of this workshop, patients will understand the importance of their sensorimotor skills (vision, proprioception, and the vestibular system) in maintaining their ability to balance as they age. Patients will apply a variety of balancing exercises that are appropriate for their current level of function. Patients will understand the common causes for poor balance, possible solutions to these problems, and ways to modify their physical environment in order to minimize their fall risk. The purpose of this lesson is to teach patients about the importance of maintaining balance as they age and ways to minimize their risk of falling.  WORKSHOPS   Nutrition:  Fueling a Ship Broker led group instruction and group discussion with PowerPoint presentation and patient guidebook. To enhance the learning environment the use of posters, models and videos may be added. Patients will review the foundational principles of the Pritikin Eating Plan and understand what constitutes a serving size in each of the food groups. Patients will also learn Pritikin-friendly foods that are better choices when away from home and review make-ahead meal and snack options. Calorie density will be reviewed and applied to three nutrition priorities: weight maintenance, weight loss, and weight gain. The purpose of this lesson is to  reinforce (in a group setting) the key concepts around what patients are recommended to eat and how to apply these guidelines when away from home by planning and selecting Pritikin-friendly options. Patients will understand how calorie density may be adjusted for different weight management goals.  Mindful Eating  Clinical staff led group instruction and group discussion with PowerPoint presentation and patient guidebook. To enhance the learning environment the use of posters, models and videos may be added. Patients will briefly review the concepts of the Pritikin Eating Plan and the importance of low-calorie dense foods. The concept of mindful eating will be introduced as well as the importance of paying attention to internal hunger signals. Triggers for non-hunger eating and techniques for dealing with triggers will be explored. The purpose of this lesson is to provide  patients with the opportunity to review the basic principles of the [facility] Eating Plan, discuss the value of eating mindfully and how to measure internal cues of hunger and fullness using the Hunger Scale. Patients will also discuss reasons for non-hunger eating and learn strategies to use for controlling emotional eating.  Targeting Your Nutrition Priorities Clinical staff led group instruction and group discussion with PowerPoint presentation and patient guidebook. To enhance the learning environment the use of posters, models and videos may be added. Patients will learn how to determine their genetic susceptibility to disease by reviewing their family history. Patients will gain insight into the importance of diet as part of an overall healthy lifestyle in mitigating the impact of genetics and other environmental insults. The purpose of this lesson is to provide patients with the opportunity to assess their personal nutrition priorities by looking at their family history, their own health history and current risk factors. Patients will  also be able to discuss ways of prioritizing and modifying the [facility] Eating Plan for their highest risk areas  Menu  Clinical staff led group instruction and group discussion with PowerPoint presentation and patient guidebook. To enhance the learning environment the use of posters, models and videos may be added. Using menus brought in from [employer], or printed from [employer], patients will apply the [facility] dining out guidelines that were presented in the [employer] video. Patients will also be able to practice these guidelines in a variety of provided scenarios. The purpose of this lesson is to provide patients with the opportunity to practice hands-on learning of the [facility] Dining Out guidelines with actual menus and practice scenarios.  Label Reading Clinical staff led group instruction and group discussion with PowerPoint presentation and patient guidebook. To enhance the learning environment the use of posters, models and videos may be added. Patients will review and discuss the [facility] label reading guidelines presented in [employer]'s Label Reading Educational series video. Using fool labels brought in from local grocery stores and markets, patients will apply the label reading guidelines and determine if the packaged food meet the [facility]in guidelines. The purpose of this lesson is to provide patients with the opportunity to review, discuss, and practice hands-on learning of the [facility] Label Reading guidelines with actual packaged food labels. Cooking School  [facility]'s [employer] are designed to teach patients ways to prepare quick, simple, and affordable recipes at home. The importance of nutrition's role in chronic disease risk reduction is reflected in its emphasis in the overall [facility] program. By learning how to prepare essential core [facility] Eating Plan recipes, patients will increase control over what they eat; be able to customize the  flavor of foods without the use of added salt, sugar, or fat; and improve the quality of the food they consume. By learning a set of core recipes which are easily assembled, quickly prepared, and affordable, patients are more likely to prepare more healthy foods at home. These workshops focus on convenient breakfasts, simple entres, side dishes, and desserts which can be prepared with minimal effort and are consistent with nutrition recommendations for cardiovascular risk reduction. Cooking [employer] are taught by a armed forces logistics/support/administrative officer (RD) who has been trained by the [employer]. The chef or RD has a clear understanding of the importance of minimizing - if not completely eliminating - added fat, sugar, and sodium in recipes. Throughout the series of Cooking School Workshop sessions, patients will learn about healthy ingredients and  efficient methods of cooking to build confidence in their capability to prepare    Cooking School weekly topics:  Adding Flavor- Sodium-Free  Fast and Healthy Breakfasts  Powerhouse Plant-Based Proteins  Satisfying Salads and Dressings  Simple Sides and Sauces  International Cuisine-Spotlight on the [employer] Zones  Delicious Desserts  Savory Soups  [employer] - Meals in a Snap  Tasty Appetizers and Snacks  Comforting Weekend Breakfasts  One-Pot Wonders   Fast Evening Meals  Landscape Architect Your Pritikin Plate  WORKSHOPS   Healthy Mindset (Psychosocial):  Focused Goals, Sustainable Changes Clinical staff led group instruction and group discussion with PowerPoint presentation and patient guidebook. To enhance the learning environment the use of posters, models and videos may be added. Patients will be able to apply effective goal setting strategies to establish at least one personal goal, and then take consistent, meaningful action toward that goal. They will learn to identify common barriers to achieving  personal goals and develop strategies to overcome them. Patients will also gain an understanding of how our mind-set can impact our ability to achieve goals and the importance of cultivating a positive and growth-oriented mind-set. The purpose of this lesson is to provide patients with a deeper understanding of how to set and achieve personal goals, as well as the tools and strategies needed to overcome common obstacles which may arise along the way.  From Head to Heart: The Power of a Healthy Outlook  Clinical staff led group instruction and group discussion with PowerPoint presentation and patient guidebook. To enhance the learning environment the use of posters, models and videos may be added. Patients will be able to recognize and describe the impact of emotions and mood on physical health. They will discover the importance of self-care and explore self-care practices which may work for them. Patients will also learn how to utilize the 4 C's to cultivate a healthier outlook and better manage stress and challenges. The purpose of this lesson is to demonstrate to patients how a healthy outlook is an essential part of maintaining good health, especially as they continue their cardiac rehab journey.  Healthy Sleep for a Healthy Heart Clinical staff led group instruction and group discussion with PowerPoint presentation and patient guidebook. To enhance the learning environment the use of posters, models and videos may be added. At the conclusion of this workshop, patients will be able to demonstrate knowledge of the importance of sleep to overall health, well-being, and quality of life. They will understand the symptoms of, and treatments for, common sleep disorders. Patients will also be able to identify daytime and nighttime behaviors which impact sleep, and they will be able to apply these tools to help manage sleep-related challenges. The purpose of this lesson is to provide patients with a general  overview of sleep and outline the importance of quality sleep. Patients will learn about a few of the most common sleep disorders. Patients will also be introduced to the concept of "sleep hygiene," and discover ways to self-manage certain sleeping problems through simple daily behavior changes. Finally, the workshop will motivate patients by clarifying the links between quality sleep and their goals of heart-healthy living.   Recognizing and Reducing Stress Clinical staff led group instruction and group discussion with PowerPoint presentation and patient guidebook. To enhance the learning environment the use of posters, models and videos may be added. At the conclusion of this workshop, patients will be able to understand the types of stress reactions, differentiate between acute and  chronic stress, and recognize the impact that chronic stress has on their health. They will also be able to apply different coping mechanisms, such as reframing negative self-talk. Patients will have the opportunity to practice a variety of stress management techniques, such as deep abdominal breathing, progressive muscle relaxation, and/or guided imagery.  The purpose of this lesson is to educate patients on the role of stress in their lives and to provide healthy techniques for coping with it.  Learning Barriers/Preferences:  Learning Barriers/Preferences - 05/26/24 1158       Learning Barriers/Preferences   Learning Barriers None    Learning Preferences Audio;Skilled Demonstration;Video;Individual Instruction;Group Instruction          Education Topics:  Knowledge Questionnaire Score:  Knowledge Questionnaire Score - 05/26/24 1406       Knowledge Questionnaire Score   Pre Score 22/24          Core Components/Risk Factors/Patient Goals at Admission:  Personal Goals and Risk Factors at Admission - 05/26/24 1157       Core Components/Risk Factors/Patient Goals on Admission    Weight Management  Yes;Obesity;Weight Loss    Intervention Weight Management/Obesity: Establish reasonable short term and long term weight goals.;Obesity: Provide education and appropriate resources to help participant work on and attain dietary goals.    Admit Weight 180 lb 12.4 oz (82 kg)    Expected Outcomes Short Term: Continue to assess and modify interventions until short term weight is achieved;Long Term: Adherence to nutrition and physical activity/exercise program aimed toward attainment of established weight goal;Weight Loss: Understanding of general recommendations for a balanced deficit meal plan, which promotes 1-2 lb weight loss per week and includes a negative energy balance of [phone] kcal/d    Hypertension Yes    Intervention Provide education on lifestyle modifcations including regular physical activity/exercise, weight management, moderate sodium restriction and increased consumption of fresh fruit, vegetables, and low fat dairy, alcohol moderation, and smoking cessation.;Monitor prescription use compliance.    Expected Outcomes Short Term: Continued assessment and intervention until BP is < 140/66mm HG in hypertensive participants. < 130/63mm HG in hypertensive participants with diabetes, heart failure or chronic kidney disease.;Long Term: Maintenance of blood pressure at goal levels.    Lipids Yes    Intervention Provide education and support for participant on nutrition & aerobic/resistive exercise along with prescribed medications to achieve LDL 70mg , HDL >40mg .    Expected Outcomes Short Term: Participant states understanding of desired cholesterol values and is compliant with medications prescribed. Participant is following exercise prescription and nutrition guidelines.;Long Term: Cholesterol controlled with medications as prescribed, with individualized exercise RX and with personalized nutrition plan. Value goals: LDL < 70mg , HDL > 40 mg.    Stress Yes    Intervention Offer individual and/or  small group education and counseling on adjustment to heart disease, stress management and health-related lifestyle change. Teach and support self-help strategies.;Refer participants experiencing significant psychosocial distress to appropriate mental health specialists for further evaluation and treatment. When possible, include family members and significant others in education/counseling sessions.    Expected Outcomes Short Term: Participant demonstrates changes in health-related behavior, relaxation and other stress management skills, ability to obtain effective social support, and compliance with psychotropic medications if prescribed.;Long Term: Emotional wellbeing is indicated by absence of clinically significant psychosocial distress or social isolation.          Core Components/Risk Factors/Patient Goals Review:   Goals and Risk Factor Review     Row Name 06/02/24 1425 06/16/24 9096  Core Components/Risk Factors/Patient Goals Review   Personal Goals Review Weight Management/Obesity;Hypertension;Lipids;Stress Weight Management/Obesity;Hypertension;Lipids;Stress      Review Detra started cardiac rehab on 06/01/24. Ieesha did well with exercise. Vital signs were stable. Delaina started cardiac rehab on 06/01/24. Milinda is off to a good start with exercise. Vital signs have been stable. Tashai has increased her workloads.      Expected Outcomes Kiarra will continue to participate in cardiac rehab for exercise, nutrition and lifestyle modifications Annaliz will continue to participate in cardiac rehab for exercise, nutrition and lifestyle modifications         Core Components/Risk Factors/Patient Goals at Discharge (Final Review):   Goals and Risk Factor Review - 06/16/24 0903       Core Components/Risk Factors/Patient Goals Review   Personal Goals Review Weight Management/Obesity;Hypertension;Lipids;Stress    Review Delaynee started cardiac rehab on 06/01/24.  Veronnica is off to a good start with exercise. Vital signs have been stable. Ashleyann has increased her workloads.    Expected Outcomes Karista will continue to participate in cardiac rehab for exercise, nutrition and lifestyle modifications          ITP Comments:  ITP Comments     Row Name 05/26/24 1047 06/01/24 1130 06/16/24 0858 07/14/24 0744     ITP Comments Medical Director- Dr. Wilbert Bihari, MD. Introduction to the [facility] Education / Intensive Cardiac Rehab Program. Reviewed initial orientation folder with Delon. 30-Day ITP review. Kaydon started the cardiac rehab program today and did very well. 30-Day ITP review. Lezlie has good attendance and participaiton with exercise at cardiac rehab 30-Day ITP review. Lendy has good  participaiton with exercise at cardiac rehab when in attendance. Tykia's last day of attendance was on 07/08/24. Kate has returned to work.       Comments: See ITP Comments

## 2024-07-14 NOTE — Progress Notes (Signed)
 QUALITY OF LIFE SCORE REVIEW  Pt completed Quality of Life survey as a participant in Cardiac Rehab.  Scores 21.0 or below are considered low.  Pt score very low in several areas Overall 18.18, Health and Function 19.83, socioeconomic 16.5, physiological and spiritual 19.64, family 13.5. Patient quality of life slightly altered by physical constraints which limits ability to perform as prior to recent cardiac illness.  Vanessa Wells is in the process of receiving counseling. Vanessa Wells has had some challenges financially and is currently staying with family. Vanessa Wells has returned to work. Vanessa Wells has had some challenges dealing with family. Vanessa Wells says that cardiac rehab has been helpful for her.    Offered emotional support and reassurance.  Will continue to monitor and intervene as necessary.  Hadassah Elpidio Quan RN BSN

## 2024-07-15 ENCOUNTER — Encounter (HOSPITAL_COMMUNITY)
Admission: RE | Admit: 2024-07-15 | Discharge: 2024-07-15 | Disposition: A | Discharge status: Home / Self Care | Source: Ambulatory Visit | Attending: Cardiology | Admitting: Cardiology

## 2024-07-15 DIAGNOSIS — I2542 Coronary artery dissection: Secondary | ICD-10-CM | POA: Diagnosis present

## 2024-07-15 DIAGNOSIS — I252 Old myocardial infarction: Secondary | ICD-10-CM | POA: Diagnosis not present

## 2024-07-15 DIAGNOSIS — Z955 Presence of coronary angioplasty implant and graft: Secondary | ICD-10-CM | POA: Diagnosis present

## 2024-07-15 DIAGNOSIS — I2102 ST elevation (STEMI) myocardial infarction involving left anterior descending coronary artery: Secondary | ICD-10-CM

## 2024-07-15 DIAGNOSIS — Z48812 Encounter for surgical aftercare following surgery on the circulatory system: Secondary | ICD-10-CM | POA: Diagnosis not present

## 2024-07-17 ENCOUNTER — Encounter (HOSPITAL_COMMUNITY): Admission: RE | Admit: 2024-07-17 | Source: Ambulatory Visit

## 2024-07-20 ENCOUNTER — Encounter (HOSPITAL_COMMUNITY)
Admission: RE | Admit: 2024-07-20 | Discharge: 2024-07-20 | Disposition: A | Discharge status: Home / Self Care | Source: Ambulatory Visit | Attending: Cardiology | Admitting: Cardiology

## 2024-07-20 DIAGNOSIS — I2102 ST elevation (STEMI) myocardial infarction involving left anterior descending coronary artery: Secondary | ICD-10-CM

## 2024-07-20 DIAGNOSIS — Z955 Presence of coronary angioplasty implant and graft: Secondary | ICD-10-CM

## 2024-07-20 DIAGNOSIS — I2542 Coronary artery dissection: Secondary | ICD-10-CM

## 2024-07-22 ENCOUNTER — Encounter (HOSPITAL_COMMUNITY)
Admission: RE | Admit: 2024-07-22 | Discharge: 2024-07-22 | Disposition: A | Discharge status: Home / Self Care | Source: Ambulatory Visit | Attending: Cardiology | Admitting: Cardiology

## 2024-07-22 DIAGNOSIS — I2542 Coronary artery dissection: Secondary | ICD-10-CM

## 2024-07-22 DIAGNOSIS — Z955 Presence of coronary angioplasty implant and graft: Secondary | ICD-10-CM | POA: Diagnosis not present

## 2024-07-22 DIAGNOSIS — I2102 ST elevation (STEMI) myocardial infarction involving left anterior descending coronary artery: Secondary | ICD-10-CM

## 2024-07-24 ENCOUNTER — Encounter (HOSPITAL_COMMUNITY): Admission: RE | Admit: 2024-07-24 | Source: Ambulatory Visit

## 2024-07-25 ENCOUNTER — Other Ambulatory Visit: Payer: Self-pay | Admitting: Nurse Practitioner

## 2024-07-27 ENCOUNTER — Telehealth (HOSPITAL_COMMUNITY): Payer: Self-pay

## 2024-07-27 ENCOUNTER — Encounter (HOSPITAL_COMMUNITY)

## 2024-07-27 NOTE — Telephone Encounter (Signed)
 Patient c/o for 10:15 CR class, states she is having car trouble.

## 2024-07-29 ENCOUNTER — Encounter (HOSPITAL_COMMUNITY)
Admission: RE | Admit: 2024-07-29 | Discharge: 2024-07-29 | Disposition: A | Source: Ambulatory Visit | Attending: Cardiology

## 2024-07-29 DIAGNOSIS — Z955 Presence of coronary angioplasty implant and graft: Secondary | ICD-10-CM

## 2024-07-29 DIAGNOSIS — I2102 ST elevation (STEMI) myocardial infarction involving left anterior descending coronary artery: Secondary | ICD-10-CM

## 2024-07-29 DIAGNOSIS — I2542 Coronary artery dissection: Secondary | ICD-10-CM

## 2024-07-31 ENCOUNTER — Encounter (HOSPITAL_COMMUNITY)

## 2024-07-31 ENCOUNTER — Telehealth (HOSPITAL_COMMUNITY): Payer: Self-pay

## 2024-07-31 NOTE — Telephone Encounter (Signed)
 Patient c/o for 10:15 CR class due to work.

## 2024-08-03 ENCOUNTER — Telehealth (HOSPITAL_COMMUNITY): Payer: Self-pay

## 2024-08-03 ENCOUNTER — Encounter (HOSPITAL_COMMUNITY): Admission: RE | Admit: 2024-08-03

## 2024-08-03 NOTE — Telephone Encounter (Signed)
 Patient c/o sick for 10:15 CR class, informed patient she must be 48 hour symptom-free before returning to class.

## 2024-08-05 ENCOUNTER — Encounter (HOSPITAL_COMMUNITY)

## 2024-08-07 ENCOUNTER — Encounter (HOSPITAL_COMMUNITY)

## 2024-08-10 ENCOUNTER — Telehealth (HOSPITAL_COMMUNITY): Payer: Self-pay

## 2024-08-10 ENCOUNTER — Encounter (HOSPITAL_COMMUNITY): Admission: RE | Admit: 2024-08-10

## 2024-08-10 NOTE — Telephone Encounter (Signed)
 Patient c/o sick for 10:15 CR class.

## 2024-08-11 NOTE — Progress Notes (Signed)
 Cardiac Individual Treatment Plan  Patient Details  Name: Vanessa Wells MRN: [id number] Date of Birth: 03/06/1966 Referring Provider:   Flowsheet Row INTENSIVE CARDIAC REHAB ORIENT from 05/26/2024 in [facility]  Referring Provider Ladona Heinz, MD    Initial Encounter Date:  Flowsheet Row INTENSIVE CARDIAC REHAB ORIENT from 05/26/2024 in [facility]  Date 05/26/24    Visit Diagnosis: 11/22/23 STEMI  11/22/23 SCAD  11/22/23 DES LAD  Patient's Home Medications on Admission:  Current Outpatient Medications:    atorvastatin  (LIPITOR) 80 MG tablet, Take 1 tablet (80 mg total) by mouth daily., Disp: 90 tablet, Rfl: 3   Cholecalciferol (TRUE VITAMIN D3) 1.25 MG (50000 UT) TABS, Take 50,000 Units by mouth every Sunday., Disp: , Rfl:    clopidogrel  (PLAVIX ) 75 MG tablet, Take 1 tablet (75 mg total) by mouth daily., Disp: 90 tablet, Rfl: 3   cyanocobalamin (VITAMIN B12) 1000 MCG tablet, Take 1 tablet (1,000 mcg total) by mouth daily., Disp: , Rfl:    metoprolol  succinate (TOPROL -XL) 50 MG 24 hr tablet, Take 1 tablet (50 mg total) by mouth daily., Disp: 90 tablet, Rfl: 3   nitroGLYCERIN  (NITROSTAT ) 0.4 MG SL tablet, Place 1 tablet (0.4 mg total) under the tongue every 5 (five) minutes as needed., Disp: 25 tablet, Rfl: 1   pantoprazole  (PROTONIX ) 40 MG tablet, Take 1 tablet (40 mg total) by mouth daily., Disp: 90 tablet, Rfl: 1   sertraline  (ZOLOFT ) 100 MG tablet, Take 100 mg by mouth daily., Disp: , Rfl:   Past Medical History: Past Medical History:  Diagnosis Date   Anxiety    Diabetes mellitus    borderline   Hypercholesterolemia    Hypertension    Obesity     Tobacco Use: Social History   Tobacco Use  Smoking Status Never  Smokeless Tobacco Never    Labs: Review Flowsheet       Latest Ref Rng & Units 10/18/2011 12/06/2023 01/15/2024  Labs for ITP Cardiac and  Pulmonary Rehab  Cholestrol 100 - 199 mg/dL - - 874   LDL (calc) 0 - 99 mg/dL - - 52   HDL-C >60 mg/dL - - 61   Trlycerides 0 - 149 mg/dL - - 55   TCO2 22 - 32 mmol/L 26  24  -     Exercise Target Goals: Exercise Program Goal: Individual exercise prescription set using results from initial 6 min walk test and THRR while considering  patient's activity barriers and safety.   Exercise Prescription Goal: Initial exercise prescription builds to 30-45 minutes a day of aerobic activity, 2-3 days per week.  Home exercise guidelines will be given to patient during program as part of exercise prescription that the participant will acknowledge.   Education: Aerobic Exercise: - Group verbal and visual presentation on the components of exercise prescription. Introduces F.I.T.T principle from ACSM for exercise prescriptions.  Reviews F.I.T.T. principles of aerobic exercise including progression. Written material provided at class time.   Education: Resistance Exercise: - Group verbal and visual presentation on the components of exercise prescription. Introduces F.I.T.T principle from ACSM for exercise prescriptions  Reviews F.I.T.T. principles of resistance exercise including progression. Written material provided at class time.    Education: Exercise & Equipment Safety: - Individual verbal instruction and demonstration of equipment use and safety with use of the equipment.   Education: Exercise Physiology & General Exercise Guidelines: - Group verbal  and written instruction with models to review the exercise physiology of the cardiovascular system and associated critical values. Provides general exercise guidelines with specific guidelines to those with heart or lung disease. Written material provided at class time.   Education: Flexibility, Balance, Mind/Body Relaxation: - Group verbal and visual presentation with interactive activity on the components of exercise prescription. Introduces  F.I.T.T principle from ACSM for exercise prescriptions. Reviews F.I.T.T. principles of flexibility and balance exercise training including progression. Also discusses the mind body connection.  Reviews various relaxation techniques to help reduce and manage stress (i.e. Deep breathing, progressive muscle relaxation, and visualization). Balance handout provided to take home. Written material provided at class time.   Activity Barriers & Risk Stratification:  Activity Barriers & Cardiac Risk Stratification - 05/26/24 1151       Activity Barriers & Cardiac Risk Stratification   Activity Barriers Balance Concerns;Other (comment)    Comments Flat feet, feels off balance at times.    Cardiac Risk Stratification High          6 Minute Walk:  6 Minute Walk     Row Name 05/26/24 1241         6 Minute Walk   Phase Initial     Distance 1476 feet     Walk Time 6 minutes     # of Rest Breaks 0     MPH 2.79     METS 3.63     RPE 11     Perceived Dyspnea  1     VO2 Peak 12.7     Symptoms Yes (comment)     Comments Mild shortness of breath.     Resting HR 56 bpm     Resting BP 134/80     Resting Oxygen Saturation  100 %     Exercise Oxygen Saturation  during 6 min walk 100 %     Max Ex. HR 85 bpm     Max Ex. BP 128/78     2 Minute Post BP 150/80        Oxygen Initial Assessment:   Oxygen Re-Evaluation:   Oxygen Discharge (Final Oxygen Re-Evaluation):   Initial Exercise Prescription:  Initial Exercise Prescription - 05/26/24 1400       Date of Initial Exercise RX and Referring Provider   Date 05/26/24    Referring Provider Ladona Heinz, MD    Expected Discharge Date 08/19/24      Treadmill   MPH 2.5    Grade 0    Minutes 15    METs 2.91      Recumbant Elliptical   Level 1    RPM 50    Watts 40    Minutes 15    METs 2.9      Prescription Details   Frequency (times per week) 3    Duration Progress to 30 minutes of continuous aerobic without signs/symptoms of  physical distress      Intensity   THRR 40-80% of Max Heartrate 66-132    Ratings of Perceived Exertion 11-13    Perceived Dyspnea 0-4      Progression   Progression Continue to progress workloads to maintain intensity without signs/symptoms of physical distress.      Resistance Training   Training Prescription Yes    Weight 3 lbs    Reps 10-15          Perform Capillary Blood Glucose checks as needed.  Exercise Prescription Changes:   Exercise Prescription Changes  Row Name 06/01/24 1028 06/12/24 1039 07/01/24 1034 07/03/24 1027 07/08/24 1027     Response to Exercise   Blood Pressure (Admit) 102/64 108/68 124/70 100/64 120/60   Blood Pressure (Exercise) 158/80 118/70 -- 108/60 --   Blood Pressure (Exit) 112/70 112/68 104/76 102/60 114/60   Heart Rate (Admit) 59 bpm 62 bpm 69 bpm 87 bpm 56 bpm   Heart Rate (Exercise) 89 bpm 91 bpm 84 bpm 93 bpm 95 bpm   Heart Rate (Exit) 67 bpm 53 bpm 59 bpm 62 bpm 62 bpm   Rating of Perceived Exertion (Exercise) 13 11 11 11 13    Symptoms None None None None None   Comments Off to a great start with exercise. -- -- Reviewed home exercise guidelines and goals with Vanessa Wells. --   Duration Continue with 30 min of aerobic exercise without signs/symptoms of physical distress. Continue with 30 min of aerobic exercise without signs/symptoms of physical distress. Continue with 30 min of aerobic exercise without signs/symptoms of physical distress. Continue with 30 min of aerobic exercise without signs/symptoms of physical distress. Continue with 30 min of aerobic exercise without signs/symptoms of physical distress.   Intensity THRR unchanged THRR unchanged THRR unchanged THRR unchanged THRR unchanged     Progression   Progression Continue to progress workloads to maintain intensity without signs/symptoms of physical distress. Continue to progress workloads to maintain intensity without signs/symptoms of physical distress. Continue to progress  workloads to maintain intensity without signs/symptoms of physical distress. Continue to progress workloads to maintain intensity without signs/symptoms of physical distress. Continue to progress workloads to maintain intensity without signs/symptoms of physical distress.   Average METs 3.1 3.3 3.7 3.3 4     Resistance Training   Training Prescription Yes Yes No Yes No   Weight 3 lbs 3 lbs Relaxation day, no weights 3 lbs right arm, 2 lbs left arm Relaxation day. No weights.   Reps 10-15 10-15 -- 10-15 --   Time 5 Minutes 5 Minutes -- 5 Minutes --     Interval Training   Interval Training No No No No No     Treadmill   MPH 2.5 2.6 2.6 2.6 2.8   Grade 0 0 0 0 1   Minutes 15 15 15 15 15    METs 2.91 2.99 2.99 2.99 3.53     Recumbant Elliptical   Level 1 3 3 3 3    RPM 60 67 68 55 68   Watts 74 57 99 84 103   Minutes 15 15 15 15 15    METs 3.4 3.7 4.5 3.7 4.4     Home Exercise Plan   Plans to continue exercise at -- -- -- Home (comment)  Walking Home (comment)  Walking   Frequency -- -- -- Add 1 additional day to program exercise sessions. Add 1 additional day to program exercise sessions.   Initial Home Exercises Provided -- -- -- 07/03/24 07/03/24    Row Name 07/29/24 1035             Response to Exercise   Blood Pressure (Admit) 114/66       Blood Pressure (Exit) 124/58       Heart Rate (Admit) 53 bpm       Heart Rate (Exercise) 115 bpm       Heart Rate (Exit) 59 bpm       Rating of Perceived Exertion (Exercise) 13       Symptoms None       Duration  Continue with 30 min of aerobic exercise without signs/symptoms of physical distress.       Intensity THRR unchanged         Progression   Progression Continue to progress workloads to maintain intensity without signs/symptoms of physical distress.       Average METs 3.7         Resistance Training   Training Prescription No       Weight Relaxation day. No weights.         Interval Training   Interval Training No          Treadmill   MPH 2.8       Grade 1       Minutes 15       METs 3.53         Recumbant Elliptical   Level 4       RPM 62       Watts 107       Minutes 15       METs 3.8         Home Exercise Plan   Plans to continue exercise at Home (comment)  Walking       Frequency Add 1 additional day to program exercise sessions.       Initial Home Exercises Provided 07/03/24          Exercise Comments:   Exercise Comments     Row Name 06/01/24 1130 07/03/24 1050         Exercise Comments Vanessa Wells tolerated first session of exercise well without symptoms. She was oriented to the exercise equipment and stretching routine. Warm-up/ Cool-down handout given. Reviewed home exercise guidelines and goals with Vanessa Wells.         Exercise Goals and Review:   Exercise Goals     Row Name 05/26/24 1234             Exercise Goals   Increase Physical Activity Yes       Intervention Provide advice, education, support and counseling about physical activity/exercise needs.;Develop an individualized exercise prescription for aerobic and resistive training based on initial evaluation findings, risk stratification, comorbidities and participant's personal goals.       Expected Outcomes Short Term: Attend rehab on a regular basis to increase amount of physical activity.;Long Term: Exercising regularly at least 3-5 days a week.;Long Term: Add in home exercise to make exercise part of routine and to increase amount of physical activity.       Increase Strength and Stamina Yes       Intervention Provide advice, education, support and counseling about physical activity/exercise needs.;Develop an individualized exercise prescription for aerobic and resistive training based on initial evaluation findings, risk stratification, comorbidities and participant's personal goals.       Expected Outcomes Short Term: Increase workloads from initial exercise prescription for resistance, speed, and METs.;Short Term:  Perform resistance training exercises routinely during rehab and add in resistance training at home;Long Term: Improve cardiorespiratory fitness, muscular endurance and strength as measured by increased METs and functional capacity ( )       Able to understand and use rate of perceived exertion (RPE) scale Yes       Intervention Provide education and explanation on how to use RPE scale       Expected Outcomes Short Term: Able to use RPE daily in rehab to express subjective intensity level;Long Term:  Able to use RPE to guide intensity level when exercising independently  Knowledge and understanding of Target Heart Rate Range (THRR) Yes       Intervention Provide education and explanation of THRR including how the numbers were predicted and where they are located for reference       Expected Outcomes Short Term: Able to state/look up THRR;Long Term: Able to use THRR to govern intensity when exercising independently;Short Term: Able to use daily as guideline for intensity in rehab       Able to check pulse independently Yes       Intervention Provide education and demonstration on how to check pulse in carotid and radial arteries.;Review the importance of being able to check your own pulse for safety during independent exercise       Expected Outcomes Long Term: Able to check pulse independently and accurately;Short Term: Able to explain why pulse checking is important during independent exercise       Understanding of Exercise Prescription Yes       Intervention Provide education, explanation, and written materials on patient's individual exercise prescription       Expected Outcomes Short Term: Able to explain program exercise prescription;Long Term: Able to explain home exercise prescription to exercise independently          Exercise Goals Re-Evaluation :  Exercise Goals Re-Evaluation     Row Name 06/01/24 1130 07/01/24 1128 07/03/24 1050         Exercise Goal Re-Evaluation    Exercise Goals Review Increase Physical Activity;Increase Strength and Stamina;Able to understand and use rate of perceived exertion (RPE) scale Increase Physical Activity;Increase Strength and Stamina;Able to understand and use rate of perceived exertion (RPE) scale Increase Physical Activity;Increase Strength and Stamina;Able to understand and use rate of perceived exertion (RPE) scale;Knowledge and understanding of Target Heart Rate Range (THRR);Able to check pulse independently;Understanding of Exercise Prescription     Comments Vanessa Wells was able to understand and use RPE scale appropriately. Vanessa Wells is making steady progress with exercise. Increasing workloads approprriately. Reviewed exercise prescription with Vanessa Wells. Exercise has been hard for her to fit in due to taking classes and trying to find a job, which has been stressful for her. We discussed adding a 30-minute walk 1 day/week to start her home exercise program. She has exercise bands and an exercise ball that she wants to start using for her resistance exercise. We discussed 2-3 non-consecutive days for resistance, 10-15 reps, 2-3 sets as tolerated. She has a pulse oximeter that she can use to monitor her pulse. We discussed increasing her workloads at cardiac rehab to help build strength and stamina. Will increase both aerobic stations next week. Her potential job requires pushing/pulling 120lbs. We discussed possible starting the rowing machine in the future to help with upper body strength.     Expected Outcomes Progress workloads as tolerated to help increase cardiorespiratory fitness. Continue to progress workloads as tolerated. Vanessa Wells will add walking 30 minutes at least 1 day /week at home to establish a routine.        Discharge Exercise Prescription (Final Exercise Prescription Changes):  Exercise Prescription Changes - 07/29/24 1035       Response to Exercise   Blood Pressure (Admit) 114/66    Blood Pressure (Exit) 124/58     Heart Rate (Admit) 53 bpm    Heart Rate (Exercise) 115 bpm    Heart Rate (Exit) 59 bpm    Rating of Perceived Exertion (Exercise) 13    Symptoms None    Duration Continue with 30 min of aerobic  exercise without signs/symptoms of physical distress.    Intensity THRR unchanged      Progression   Progression Continue to progress workloads to maintain intensity without signs/symptoms of physical distress.    Average METs 3.7      Resistance Training   Training Prescription No    Weight Relaxation day. No weights.      Interval Training   Interval Training No      Treadmill   MPH 2.8    Grade 1    Minutes 15    METs 3.53      Recumbant Elliptical   Level 4    RPM 62    Watts 107    Minutes 15    METs 3.8      Home Exercise Plan   Plans to continue exercise at Home (comment)   Walking   Frequency Add 1 additional day to program exercise sessions.    Initial Home Exercises Provided 07/03/24          Nutrition:  Target Goals: Understanding of nutrition guidelines, daily intake of sodium 1500mg , cholesterol 200mg , calories 30% from fat and 7% or less from saturated fats, daily to have 5 or more servings of fruits and vegetables.  Education: Nutrition 1 -Group instruction provided by verbal, written material, interactive activities, discussions, models, and posters to present general guidelines for heart healthy nutrition including macronutrients, label reading, and promoting whole foods over processed counterparts. Education serves as pensions consultant of discussion of heart healthy eating for all. Written material provided at class time.    Education: Nutrition 2 -Group instruction provided by verbal, written material, interactive activities, discussions, models, and posters to present general guidelines for heart healthy nutrition including sodium, cholesterol, and saturated fat. Providing guidance of habit forming to improve blood pressure, cholesterol, and body weight.  Written material provided at class time.     Biometrics:  Pre Biometrics - 05/26/24 1047       Pre Biometrics   Waist Circumference 39.75 inches    Hip Circumference 45 inches    Waist to Hip Ratio 0.88 %    Triceps Skinfold 24 mm    % Body Fat 40.1 %    Grip Strength 22 kg    Flexibility 16.5 in    Single Leg Stand 30 seconds           Nutrition Therapy Plan and Nutrition Goals:   Nutrition Assessments:  MEDIFICTS Score Key: >=70 Need to make dietary changes  40-70 Heart Healthy Diet <= 40 Therapeutic Level Cholesterol Diet   Picture Your Plate Scores: <59 Unhealthy dietary pattern with much room for improvement. 41-50 Dietary pattern unlikely to meet recommendations for good health and room for improvement. 51-60 More healthful dietary pattern, with some room for improvement.  >60 Healthy dietary pattern, although there may be some specific behaviors that could be improved.    Nutrition Goals Re-Evaluation:   Nutrition Goals Discharge (Final Nutrition Goals Re-Evaluation):   Psychosocial: Target Goals: Acknowledge presence or absence of significant depression and/or stress, maximize coping skills, provide positive support system. Participant is able to verbalize types and ability to use techniques and skills needed for reducing stress and depression.   Education: Stress, Anxiety, and Depression - Group verbal and visual presentation to define topics covered.  Reviews how body is impacted by stress, anxiety, and depression.  Also discusses healthy ways to reduce stress and to treat/manage anxiety and depression. Written material provided at class time.   Education: Sleep Hygiene -  Provides group verbal and written instruction about how sleep can affect your health.  Define sleep hygiene, discuss sleep cycles and impact of sleep habits. Review good sleep hygiene tips.   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 She lost her car but is saving for a new one. She previously was seen by a counselor but not since COVID. She would like a referral to a counselor.      Family Dynamics   Good Support System? Yes    Comments Shandon has gret support from her family especially a cousin who is like a sister to her. She has 4 sisters and 1 brother.      Barriers   Psychosocial barriers to participate in program Psychosocial barriers identified (see note);The patient should benefit from training in stress management and relaxation.      Screening Interventions   Interventions Encouraged to exercise;To provide support and resources with identified psychosocial needs;Provide feedback about the scores to participant    Expected Outcomes Short Term goal: Utilizing psychosocial counselor, staff and physician to assist with identification of specific Stressors or current issues interfering with healing process. Setting desired goal for each stressor or current issue identified.;Long Term Goal: Stressors or current issues are controlled or eliminated.;Short Term goal: Identification and review with participant of any Quality of Life or Depression concerns found by scoring the questionnaire.;Long Term goal: The participant improves quality of Life and PHQ9 Scores as seen by post scores and/or verbalization of changes          Quality of Life Scores:   Quality of Life - 05/26/24 1157       Quality of Life   Select Quality of Life      Quality of Life Scores   Health/Function Pre 19.83 %    Socioeconomic Pre 16.5 %    Psych/Spiritual Pre 19.64 %    Family Pre 13.5  %    GLOBAL Pre 18.18 %         Scores of 19 and below usually indicate a poorer quality of life in these areas.  A difference of  2-3 points is a clinically meaningful difference.  A difference of 2-3 points in the total score of the Quality of Life Index has been associated with significant improvement in overall quality of life, self-image, physical symptoms, and general health in studies assessing change in quality of life.  PHQ-9: Review Flowsheet       05/26/2024 01/24/2018  Depression screen PHQ 2/9  Decreased Interest 1 0  Down, Depressed, Hopeless 1 0  PHQ - 2 Score 2 0  Altered sleeping 2 -  Tired, decreased energy 1 -  Change in appetite 2 -  Feeling bad or failure about yourself  1 -  Trouble concentrating 1 -  Moving slowly or fidgety/restless 1 -  Suicidal thoughts 0 -  PHQ-9 Score 10  -  Difficult doing work/chores Not difficult at all -    Details       Data saved with a previous flowsheet row definition        Interpretation of Total Score  Total Score  Depression Severity:  1-4 = Minimal depression, 5-9 = Mild depression, 10-14 = Moderate depression, 15-19 = Moderately severe depression, 20-27 = Severe depression   Psychosocial Evaluation and Intervention:   Psychosocial Re-Evaluation:  Psychosocial Re-Evaluation     Row Name 06/02/24 1414 06/16/24 0859 07/14/24 0746 08/11/24 1505       Psychosocial Re-Evaluation   Current issues with Current Stress Concerns;Current Sleep Concerns;History of Depression;Current Depression Current Stress Concerns;Current Sleep Concerns;History of Depression;Current Depression Current Stress Concerns;Current Sleep Concerns;History of Depression;Current Depression Current Stress Concerns;Current Sleep Concerns;History of Depression;Current Depression    Comments Vanessa Wells did not voice any increased concerns or stressors on her first day of exercise. A counselling referal has been placed Reviewed PHQ9 . Vanessa Wells says that  her energy level has increased since she has been participating in cardiac rehab. Sameen has had some chalenges as she has had some challanges with her current living situation as Vanessa Wells is staying with family. Vanessa Wells was given website to [employer] for transportation resources ReviewedJennifer is in the process of receiving counseling. Vanessa Wells has had some challenges financially and is currently staying with family. Vanessa Wells has returned to work. Vanessa Wells has had some challenges dealing with family. Vanessa Wells says that cardiac rehab has been helpful for her. ReviewedJennifer is in the process of receiving counseling.Vanessa Wells says that cardiac rehab has been helpful for her. I encouraged Vanessa Wells to look into [employer] [facility] [employer] as a resource for possible housing as Belle is still staying with firends and family.    Expected Outcomes Vanessa Wells will have controlled or decreased depression upon completion of cardiac rehab Vanessa Wells will have controlled or decreased depression upon completion of cardiac rehab Vanessa Wells will have controlled or decreased depression upon completion of cardiac rehab Eve will have controlled or decreased depression upon completion of cardiac rehab    Interventions Stress management education;Relaxation education;Encouraged to attend Cardiac Rehabilitation for the exercise Stress management education;Relaxation education;Encouraged to attend Cardiac Rehabilitation for the exercise Stress management education;Relaxation education;Encouraged to attend Cardiac Rehabilitation for the exercise Stress management education;Relaxation education;Encouraged to attend Cardiac Rehabilitation for the exercise    Continue Psychosocial Services  No Follow up required No Follow up required No Follow up required No Follow up required      Initial Review   Source of Stress Concerns Financial;Transportation;Retirement/disability Financial;Transportation;Retirement/disability  Financial;Transportation;Retirement/disability Financial;Transportation;Retirement/disability    Comments Will continue to monitor and offer support as needed Will continue to monitor and offer support as needed Will continue to monitor and offer support as needed Will continue to monitor and offer support as needed       Psychosocial Discharge (Final Psychosocial Re-Evaluation):  Psychosocial Re-Evaluation - 08/11/24 1505       Psychosocial Re-Evaluation   Current issues with Current Stress Concerns;Current Sleep Concerns;History of Depression;Current Depression    Comments ReviewedJennifer is in the process of receiving counseling.Vanessa Wells says that cardiac rehab has been helpful for her. I encouraged Vanessa Wells to look into [facility] as a resource for possible housing as Vanessa Wells is still staying with firends and family.    Expected Outcomes Vanessa Wells will have controlled or decreased depression upon completion of cardiac rehab    Interventions Stress management education;Relaxation education;Encouraged to attend Cardiac Rehabilitation for the exercise    Continue Psychosocial Services  No Follow up required      Initial Review   Source of Stress Concerns Financial;Transportation;Retirement/disability    Comments Will continue to monitor and offer support as needed  Vocational Rehabilitation: Provide vocational rehab assistance to qualifying candidates.   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          Education: Education Goals: Education classes will be provided on a variety of topics geared toward better  understanding of heart health and risk factor modification. Participant will state understanding/return demonstration of topics presented as noted by education test scores.  Learning Barriers/Preferences:  Learning Barriers/Preferences - 05/26/24 1158       Learning Barriers/Preferences   Learning Barriers None    Learning Preferences Audio;Skilled Demonstration;Video;Individual Instruction;Group Instruction          General Cardiac Education Topics:  AED/CPR: - Group verbal and written instruction with the use of models to demonstrate the basic use of the AED with the basic ABC's of resuscitation.   Test and Procedures: - Group verbal and visual presentation and models provide information about basic cardiac anatomy and function. Reviews the testing methods done to diagnose heart disease and the outcomes of the test results. Describes the treatment choices: Medical Management, Angioplasty, or Coronary Bypass Surgery for treating various heart conditions including Myocardial Infarction, Angina, Valve Disease, and Cardiac Arrhythmias. Written material provided at class time.   Medication Safety: - Group verbal and visual instruction to review commonly prescribed medications for heart and lung disease. Reviews the medication, class of the drug, and side effects. Includes the steps to properly store meds and maintain the prescription regimen. Written material provided at class time.   Intimacy: - Group verbal instruction through game format to discuss how heart and lung disease can affect sexual intimacy. Written material provided at class time.   Know Your Numbers and Heart Failure: - Group verbal and visual instruction to discuss disease risk factors for cardiac and pulmonary disease and treatment options.  Reviews associated critical values for Overweight/Obesity, Hypertension, Cholesterol, and Diabetes.  Discusses basics of heart failure: signs/symptoms and treatments.  Introduces  Heart Failure Zone chart for action plan for heart failure. Written material provided at class time.   Infection Prevention: - Provides verbal and written material to individual with discussion of infection control including proper hand washing and proper equipment cleaning during exercise session.   Falls Prevention: - Provides verbal and written material to individual with discussion of falls prevention and safety.   Other: -Provides group and verbal instruction on various topics (see comments)   Knowledge Questionnaire Score:  Knowledge Questionnaire Score - 05/26/24 1406       Knowledge Questionnaire Score   Pre Score 22/24          Core Components/Risk Factors/Patient Goals at Admission:  Personal Goals and Risk Factors at Admission - 05/26/24 1157       Core Components/Risk Factors/Patient Goals on Admission    Weight Management Yes;Obesity;Weight Loss    Intervention Weight Management/Obesity: Establish reasonable short term and long term weight goals.;Obesity: Provide education and appropriate resources to help participant work on and attain dietary goals.    Admit Weight 180 lb 12.4 oz (82 kg)    Expected Outcomes Short Term: Continue to assess and modify interventions until short term weight is achieved;Long Term: Adherence to  nutrition and physical activity/exercise program aimed toward attainment of established weight goal;Weight Loss: Understanding of general recommendations for a balanced deficit meal plan, which promotes 1-2 lb weight loss per week and includes a negative energy balance of [phone] kcal/d    Hypertension Yes    Intervention Provide education on lifestyle modifcations including regular physical activity/exercise, weight management, moderate sodium restriction and increased consumption of fresh fruit, vegetables, and low fat dairy, alcohol moderation, and smoking cessation.;Monitor prescription use compliance.    Expected Outcomes Short Term:  Continued assessment and intervention until BP is < 140/66mm HG in hypertensive participants. < 130/78mm HG in hypertensive participants with diabetes, heart failure or chronic kidney disease.;Long Term: Maintenance of blood pressure at goal levels.    Lipids Yes    Intervention Provide education and support for participant on nutrition & aerobic/resistive exercise along with prescribed medications to achieve LDL 70mg , HDL >40mg .    Expected Outcomes Short Term: Participant states understanding of desired cholesterol values and is compliant with medications prescribed. Participant is following exercise prescription and nutrition guidelines.;Long Term: Cholesterol controlled with medications as prescribed, with individualized exercise RX and with personalized nutrition plan. Value goals: LDL < 70mg , HDL > 40 mg.    Stress Yes    Intervention Offer individual and/or small group education and counseling on adjustment to heart disease, stress management and health-related lifestyle change. Teach and support self-help strategies.;Refer participants experiencing significant psychosocial distress to appropriate mental health specialists for further evaluation and treatment. When possible, include family members and significant others in education/counseling sessions.    Expected Outcomes Short Term: Participant demonstrates changes in health-related behavior, relaxation and other stress management skills, ability to obtain effective social support, and compliance with psychotropic medications if prescribed.;Long Term: Emotional wellbeing is indicated by absence of clinically significant psychosocial distress or social isolation.          Education:Diabetes - Individual verbal and written instruction to review signs/symptoms of diabetes, desired ranges of glucose level fasting, after meals and with exercise. Acknowledge that pre and post exercise glucose checks will be done for 3 sessions at entry of  program.   Core Components/Risk Factors/Patient Goals Review:   Goals and Risk Factor Review     Row Name 06/02/24 1425 06/16/24 0903 07/14/24 0954 08/11/24 1507       Core Components/Risk Factors/Patient Goals Review   Personal Goals Review Weight Management/Obesity;Hypertension;Lipids;Stress Weight Management/Obesity;Hypertension;Lipids;Stress Weight Management/Obesity;Hypertension;Lipids;Stress Weight Management/Obesity;Hypertension;Lipids;Stress    Review Zeniya started cardiac rehab on 06/01/24. Alonni did well with exercise. Vital signs were stable. Keyon started cardiac rehab on 06/01/24. Daphine is off to a good start with exercise. Vital signs have been stable. Azyria has increased her workloads. Ellery has been doing well  with exercise. Vital signs have been stable. Anneta has increased her workloads. Maikayla continues to do  well  with exercise when in attendance. Vital signs remain stable. Dulcie has increased her workloads.    Expected Outcomes Kailen will continue to participate in cardiac rehab for exercise, nutrition and lifestyle modifications Paytience will continue to participate in cardiac rehab for exercise, nutrition and lifestyle modifications Dayzha will continue to participate in cardiac rehab for exercise, nutrition and lifestyle modifications Vanessa Wells will continue to participate in cardiac rehab for exercise, nutrition and lifestyle modifications       Core Components/Risk Factors/Patient Goals at Discharge (Final Review):   Goals and Risk Factor Review - 08/11/24 1507       Core Components/Risk Factors/Patient Goals Review   Personal Goals  Review Weight Management/Obesity;Hypertension;Lipids;Stress    Review Gethsemane continues to do  well  with exercise when in attendance. Vital signs remain stable. Misaki has increased her workloads.    Expected Outcomes Adaly will continue to participate in cardiac rehab for exercise, nutrition and  lifestyle modifications          ITP Comments:  ITP Comments     Row Name 05/26/24 1047 06/01/24 1130 06/16/24 0858 07/14/24 0744 08/11/24 1504   ITP Comments Medical Director- Dr. Wilbert Bihari, MD. Introduction to the [facility] / Intensive Cardiac Rehab Program. Reviewed initial orientation folder with Vanessa Wells. 30-Day ITP review. Kea started the cardiac rehab program today and did very well. 30-Day ITP review. Shaquanta has good attendance and participaiton with exercise at cardiac rehab 30-Day ITP review. Amori has good  participaiton with exercise at cardiac rehab when in attendance. Montana's last day of attendance was on 07/08/24. Miana has returned to work. 30-Day ITP review. Zani has good  participaiton with exercise at cardiac rehab when in attendance. Markee's attendance has been sporadic due to her work schedule.      Comments: See ITP Comments

## 2024-08-12 ENCOUNTER — Encounter (HOSPITAL_COMMUNITY)

## 2024-08-14 ENCOUNTER — Encounter (HOSPITAL_COMMUNITY)

## 2024-08-17 ENCOUNTER — Encounter (HOSPITAL_COMMUNITY): Admission: RE | Admit: 2024-08-17

## 2024-08-17 ENCOUNTER — Telehealth (HOSPITAL_COMMUNITY): Payer: Self-pay

## 2024-08-17 NOTE — Telephone Encounter (Signed)
 Patient c/o for 10:15 CR class as school is out early for kids.

## 2024-08-18 ENCOUNTER — Ambulatory Visit: Admitting: Licensed Clinical Social Worker

## 2024-08-19 ENCOUNTER — Encounter (HOSPITAL_COMMUNITY)

## 2024-08-21 ENCOUNTER — Encounter (HOSPITAL_COMMUNITY): Admission: RE | Admit: 2024-08-21

## 2024-08-24 ENCOUNTER — Encounter (HOSPITAL_COMMUNITY): Payer: Self-pay | Admitting: *Deleted

## 2024-08-24 ENCOUNTER — Telehealth (HOSPITAL_COMMUNITY): Payer: Self-pay | Admitting: *Deleted

## 2024-08-24 DIAGNOSIS — I2102 ST elevation (STEMI) myocardial infarction involving left anterior descending coronary artery: Secondary | ICD-10-CM

## 2024-08-24 DIAGNOSIS — Z955 Presence of coronary angioplasty implant and graft: Secondary | ICD-10-CM

## 2024-08-24 DIAGNOSIS — I2542 Coronary artery dissection: Secondary | ICD-10-CM

## 2024-08-24 NOTE — Progress Notes (Signed)
 Discharge Progress Report  Patient Details  Name: Vanessa Wells MRN: [id number] Date of Birth: 1966-06-27 Referring Provider:   Flowsheet Row INTENSIVE CARDIAC REHAB ORIENT from 05/26/2024 in [facility]  Referring Provider Ladona Heinz, MD     Number of Visits: 30  Reason for Discharge:  Patient reached a stable level of exercise.  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    Intensity THRR unchanged      Progression   Progression Continue to progress workloads to maintain intensity without signs/symptoms of physical distress.    Average METs 3.7      Resistance Training   Training Prescription No    Weight Relaxation day. No weights.      Interval Training   Interval Training No      Treadmill   MPH 2.8    Grade 1    Minutes 15    METs 3.53      Recumbant Elliptical   Level 4    RPM 62    Watts 107    Minutes 15    METs 3.8      Home Exercise Plan   Plans to continue exercise at Home (comment)   Walking   Frequency Add 1 additional day to program exercise sessions.    Initial Home Exercises Provided 07/03/24          Functional Capacity:   Psychological, QOL, Others - Outcomes: PHQ 2/9:    05/26/2024   11:35 AM 01/24/2018   10:21 AM  Depression screen PHQ 2/9  Decreased Interest 1 0  Down, Depressed, Hopeless 1 0   PHQ - 2 Score 2 0  Altered sleeping 2   Tired, decreased energy 1   Change in appetite 2   Feeling bad or failure about yourself  1   Trouble concentrating 1   Moving slowly or fidgety/restless 1   Suicidal thoughts 0   PHQ-9 Score 10    Difficult doing work/chores Not difficult at all      Data saved with a previous flowsheet row definition    Quality of Life:   Personal Goals: Goals established at orientation with interventions provided to work toward goal.    Personal Goals Discharge:  Goals and Risk Factor Review     Row Name 07/14/24 0954 08/11/24 1507 08/27/24 1401         Core Components/Risk Factors/Patient Goals Review   Personal Goals Review Weight Management/Obesity;Hypertension;Lipids;Stress Weight Management/Obesity;Hypertension;Lipids;Stress Weight Management/Obesity;Hypertension;Lipids;Stress     Review Domonic has been doing well  with exercise. Vital signs have been stable. Alleene has increased her workloads. Keniyah continues to do  well  with exercise when in attendance. Vital signs remain stable. Disney has increased her workloads. Tasheba did well with exercise at cardiac rehab. Rhylynn's last day of attendance  at cardiac rehab was on 07/29/24. 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     Expected Outcomes Briani will continue to participate in cardiac rehab for exercise, nutrition and lifestyle modifications Aneesha will continue to participate in cardiac rehab for exercise, nutrition and lifestyle modifications Danasia will continue to exercise, follow nutrition and lifestyle modifications on her own.        Exercise Goals and Review:   Exercise Goals Re-Evaluation:  Exercise Goals Re-Evaluation     Row Name 07/01/24 1128 07/03/24 1050 08/25/24 1629         Exercise Goal Re-Evaluation   Exercise Goals Review Increase Physical Activity;Increase Strength and Stamina;Able to understand and use rate of  perceived exertion (RPE) scale Increase Physical Activity;Increase Strength and Stamina;Able to understand and use rate of perceived exertion (RPE) scale;Knowledge and understanding of Target Heart Rate Range (THRR);Able to check pulse independently;Understanding of Exercise Prescription Increase Physical Activity;Increase Strength and Stamina;Able to understand and use rate of perceived exertion (RPE) scale;Knowledge and understanding of Target Heart Rate Range (THRR);Able to check pulse independently;Understanding of Exercise Prescription     Comments Ashleah is making steady progress with exercise. Increasing workloads approprriately. Reviewed exercise prescription with Delon. Exercise has been hard for her to fit in due to taking classes and trying to find a job, which has been stressful for her. We discussed adding a 30-minute walk 1 day/week to start her home exercise program. She has exercise bands and an exercise ball that she wants to start using for her resistance exercise. We discussed 2-3 non-consecutive days for resistance, 10-15 reps, 2-3 sets as tolerated. She has a pulse oximeter that she can use to monitor her pulse. We discussed increasing her workloads at cardiac rehab to help build strength and stamina. Will increase both aerobic stations next week. Her potential job requires pushing/pulling 120lbs. We discussed possible starting the rowing machine in the future to help with upper body strength. Krystyn has been absent since 07/29/24. Will discharge from the cardiac rehab program at this time.     Expected Outcomes Continue to progress workloads as tolerated. Dilynn will add walking 30 minutes at least 1 day /week at home to establish a routine. Continue exercise independently.        Nutrition & Weight - Outcomes:    Nutrition:   Nutrition Discharge:   Education Questionnaire Score:   Ioana completed  18 exercise and  12 education sessions. Chrystal progressed nicely  during her participation in rehab as evidenced by increased MET level. Mariell's last day of exercise was on 07/29/24. 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 Lakitha has been discharged due to non attendance. Ltanya said that cardiac rehab was helpful for her. Aliscia did not return to complete her post exercise walk test. We wish Jodette the best. Tahiry said that participating in cardiac rehab was helpful for her. Hadassah Elpidio Quan RN BSN      [1]  Social History Tobacco Use  Smoking Status Never  Smokeless Tobacco Never

## 2024-08-24 NOTE — Telephone Encounter (Signed)
 Unable to leave message. Will discharge for non attendance.Hadassah Elpidio Quan RN BSN

## 2024-09-19 ENCOUNTER — Emergency Department (HOSPITAL_COMMUNITY)

## 2024-09-19 ENCOUNTER — Other Ambulatory Visit: Payer: Self-pay

## 2024-09-19 ENCOUNTER — Emergency Department (HOSPITAL_COMMUNITY)
Admission: EM | Admit: 2024-09-19 | Discharge: 2024-09-19 | Disposition: A | Discharge status: Home / Self Care | Attending: Emergency Medicine | Admitting: Emergency Medicine

## 2024-09-19 DIAGNOSIS — Z951 Presence of aortocoronary bypass graft: Secondary | ICD-10-CM | POA: Insufficient documentation

## 2024-09-19 DIAGNOSIS — Z7902 Long term (current) use of antithrombotics/antiplatelets: Secondary | ICD-10-CM | POA: Diagnosis not present

## 2024-09-19 DIAGNOSIS — R5381 Other malaise: Secondary | ICD-10-CM | POA: Insufficient documentation

## 2024-09-19 DIAGNOSIS — Z79899 Other long term (current) drug therapy: Secondary | ICD-10-CM | POA: Insufficient documentation

## 2024-09-19 DIAGNOSIS — I10 Essential (primary) hypertension: Secondary | ICD-10-CM | POA: Diagnosis not present

## 2024-09-19 DIAGNOSIS — R42 Dizziness and giddiness: Secondary | ICD-10-CM | POA: Insufficient documentation

## 2024-09-19 DIAGNOSIS — I2510 Atherosclerotic heart disease of native coronary artery without angina pectoris: Secondary | ICD-10-CM | POA: Diagnosis not present

## 2024-09-19 DIAGNOSIS — Z955 Presence of coronary angioplasty implant and graft: Secondary | ICD-10-CM | POA: Diagnosis not present

## 2024-09-19 DIAGNOSIS — R0602 Shortness of breath: Secondary | ICD-10-CM | POA: Diagnosis not present

## 2024-09-19 DIAGNOSIS — R61 Generalized hyperhidrosis: Secondary | ICD-10-CM | POA: Diagnosis not present

## 2024-09-19 LAB — URINALYSIS, ROUTINE W REFLEX MICROSCOPIC
Bilirubin Urine: NEGATIVE
Glucose, UA: 50 mg/dL — AB
Hgb urine dipstick: NEGATIVE
Ketones, ur: NEGATIVE mg/dL
Leukocytes,Ua: NEGATIVE
Nitrite: NEGATIVE
Protein, ur: NEGATIVE mg/dL
Specific Gravity, Urine: 1.024 (ref 1.005–1.030)
pH: 5 (ref 5.0–8.0)

## 2024-09-19 LAB — BASIC METABOLIC PANEL WITH GFR
Anion gap: 8 (ref 5–15)
BUN: 13 mg/dL (ref 6–20)
CO2: 27 mmol/L (ref 22–32)
Calcium: 8.6 mg/dL — ABNORMAL LOW (ref 8.9–10.3)
Chloride: 106 mmol/L (ref 98–111)
Creatinine, Ser: 0.65 mg/dL (ref 0.44–1.00)
GFR, Estimated: >60 mL/min
Glucose, Bld: 79 mg/dL (ref 70–99)
Potassium: 4.1 mmol/L (ref 3.5–5.1)
Sodium: 141 mmol/L (ref 135–145)

## 2024-09-19 LAB — CBC
HCT: 34.8 % — ABNORMAL LOW (ref 36.0–46.0)
Hemoglobin: 11.3 g/dL — ABNORMAL LOW (ref 12.0–15.0)
MCH: 31.6 pg (ref 26.0–34.0)
MCHC: 32.5 g/dL (ref 30.0–36.0)
MCV: 97.2 fL (ref 80.0–100.0)
Platelets: 302 K/uL (ref 150–400)
RBC: 3.58 MIL/uL — ABNORMAL LOW (ref 3.87–5.11)
RDW: 12.5 % (ref 11.5–15.5)
WBC: 5.1 K/uL (ref 4.0–10.5)
nRBC: 0 % (ref 0.0–0.2)

## 2024-09-19 LAB — PRO BRAIN NATRIURETIC PEPTIDE: Pro Brain Natriuretic Peptide: 508 pg/mL — ABNORMAL HIGH

## 2024-09-19 LAB — TROPONIN T, HIGH SENSITIVITY
Troponin T High Sensitivity: <15 ng/L (ref 0–19)
Troponin T High Sensitivity: <15 ng/L (ref 0–19)

## 2024-09-19 LAB — CBG MONITORING, ED: Glucose-Capillary: 81 mg/dL (ref 70–99)

## 2024-09-19 MED ORDER — LACTATED RINGERS IV BOLUS
1000.0000 mL | Freq: Once | INTRAVENOUS | Status: AC
  Administered 2024-09-19: 1000 mL via INTRAVENOUS

## 2024-09-19 NOTE — ED Notes (Signed)
 Help get patient into a gown on the monitor did EKG shown to Dr Randol patient is resting with call bell in reach

## 2024-09-19 NOTE — Discharge Instructions (Addendum)
 Thank for letting us  evaluate you today.  Your 2 cardiac enzymes were negative for heart injury or heart attack.  Your chest x-ray did not show any fluid or pneumonia.  You may follow-up with your cardiologist as scheduled.  Your CT of your head did not show any acute intracranial abnormalities.  Your urine is noninfectious.  We have given you a dose of IV fluids for hydration here.  Please make sure to drink plenty of fluids.  Return to Emergency Department if you experience similar episode, chest pain, shortness of breath, loss of consciousness, seizure-like activity, worsening symptoms

## 2024-09-19 NOTE — ED Notes (Signed)
 Patient transported to CT

## 2024-09-19 NOTE — ED Provider Notes (Signed)
 " [place] [facility] Provider Note   CSN: [id number] Arrival date & time: 09/19/24  1759     Patient presents with: Chest Pain   Vanessa Wells is a 59 y.o. female anxiety, hypertension, hypercholesterolemia, coronary artery disease s/p STEMI with 3 coronary stents 11/2023, S/P Roux-en-Y gastric bypass 2003, GIB, transfusion presents Emergency Department for evaluation of lightheadedness, diaphoresis, palpitations, shortness of breath that occurred 1 hour ago.  She was lying down on the couch with her 66 year old daughter when she started feeling not right.  She reports that she suddenly felt lightheaded, diaphoretic sweating through an entire T-shirt.  She took 1 nitroglycerin  with improvement of symptoms.  She endorses feeling confused and was worried that she may not be making sense when answering questions.  Daughter witnessed this episode and denies any slurred speech, loss consciousness, seizure-like activity, confusion.  EMS denies any AMS, slurred speech with them.  Patient has a history of MI 2025 where she had symptoms of paresthesia under her armpits that radiated into her arm, generalized weakness, shortness of breath.  Patient did not necessarily have chest pain.  Reports that she had similar feeling of paresthesia under her shoulder and radiate into left arm that occurred 3 times yesterday.  Has not had any chest pain.  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  Symptoms felt similar to when she has had COVID in the past but feels that they have improved since then.  She has not been tested for COVID, flu, nor RSV. Denies thinners, recent falls, LOC, back pain   HPI     Prior to Admission medications  Medication Sig Start Date End Date Taking? Authorizing Provider  atorvastatin  (LIPITOR) 80 MG tablet Take 1 tablet (80 mg total) by mouth daily. 02/14/24   Ganji, Jay, MD   Cholecalciferol (TRUE VITAMIN D3) 1.25 MG (50000 UT) TABS Take 50,000 Units by mouth every Sunday.    [provider]  clopidogrel  (PLAVIX ) 75 MG tablet Take 1 tablet (75 mg total) by mouth daily. 01/15/24   Ladona Heinz, MD  cyanocobalamin (VITAMIN B12) 1000 MCG tablet Take 1 tablet (1,000 mcg total) by mouth daily. 01/21/24   Ladona Heinz, MD  metoprolol  succinate (TOPROL -XL) 50 MG 24 hr tablet Take 1 tablet (50 mg total) by mouth daily. 02/14/24   Ladona Heinz, MD  nitroGLYCERIN  (NITROSTAT ) 0.4 MG SL tablet Place 1 tablet (0.4 mg total) under the tongue every 5 (five) minutes as needed. 01/15/24 05/26/24  Ladona Heinz, MD  pantoprazole  (PROTONIX ) 40 MG tablet Take 1 tablet (40 mg total) by mouth daily. 07/26/24   Kennedy-Smith, Colleen M, NP  sertraline  (ZOLOFT ) 100 MG tablet Take 100 mg by mouth daily.    [provider]    Allergies: Niaspan [niacin]    Review of Systems  Respiratory:  Positive for shortness of breath.   Neurological:  Positive for light-headedness.    Updated Vital Signs BP 124/85   Pulse (!) 56   Temp (!) 97.3 F (36.3 C) (Oral)   Resp 16   SpO2 100%   Physical Exam Vitals and nursing note reviewed.  Constitutional:      General: She is not in acute distress.    Appearance: Normal appearance.  HENT:     Head: Normocephalic and atraumatic.  Eyes:     General: Lids are normal. Vision grossly intact. No visual field deficit.  Extraocular Movements:     Right eye: Normal extraocular motion and no nystagmus.     Left eye: Normal extraocular motion and no nystagmus.     Conjunctiva/sclera: Conjunctivae normal.  Cardiovascular:     Rate and Rhythm: Normal rate.     Pulses: Normal pulses.          Dorsalis pedis pulses are 2+ on the right side and 2+ on the left side.  Pulmonary:     Effort: Pulmonary effort is normal. No respiratory distress.  Musculoskeletal:     Cervical back: Normal range of motion and neck supple. No rigidity.     Right lower  leg: No tenderness. No edema.     Left lower leg: No tenderness. No edema.  Skin:    Coloration: Skin is not jaundiced or pale.  Neurological:     Mental Status: She is alert and oriented to person, place, and time. Mental status is at baseline.     GCS: GCS eye subscore is 4. GCS verbal subscore is 5. GCS motor subscore is 6.     Cranial Nerves: No cranial nerve deficit, dysarthria or facial asymmetry.     Sensory: Sensation is intact. No sensory deficit.     Motor: No weakness, tremor, atrophy, abnormal muscle tone, seizure activity or pronator drift.     Coordination: Coordination normal. Finger-Nose-Finger Test and Heel to Wildrose Test normal.     Gait: Gait is intact.     Comments: No facial droop.  Motor 5/5 and sensation 2/2 BUE and BLE.  No speech or visual deficits.  No dizziness.     (all labs ordered are listed, but only abnormal results are displayed) Labs Reviewed  BASIC METABOLIC PANEL WITH GFR - Abnormal; Notable for the following components:      Result Value   Calcium  8.6 (*)    All other components within normal limits  CBC - Abnormal; Notable for the following components:   RBC 3.58 (*)    Hemoglobin 11.3 (*)    HCT 34.8 (*)    All other components within normal limits  PRO BRAIN NATRIURETIC PEPTIDE - Abnormal; Notable for the following components:   Pro Brain Natriuretic Peptide 508.0 (*)    All other components within normal limits  URINALYSIS, ROUTINE W REFLEX MICROSCOPIC - Abnormal; Notable for the following components:   APPearance HAZY (*)    Glucose, UA 50 (*)    All other components within normal limits  CBG MONITORING, ED  TROPONIN T, HIGH SENSITIVITY  TROPONIN T, HIGH SENSITIVITY    EKG: EKG Interpretation Date/Time:  Saturday September 19 2024 18:03:22 EST Ventricular Rate:  54 PR Interval:  215 QRS Duration:  98 QT Interval:  439 QTC Calculation: 416 R Axis:   52  Text Interpretation: Sinus rhythm Prolonged PR interval Low voltage, extremity  leads Confirmed by Randol Simmonds [phone]) on 09/19/2024 6:06:16 PM  Radiology: CT Head Wo Contrast Result Date: 09/19/2024 CLINICAL DATA:  Chest pressure, diaphoresis, neurologic deficit EXAM: CT HEAD WITHOUT CONTRAST TECHNIQUE: Contiguous axial images were obtained from the base of the skull through the vertex without intravenous contrast. RADIATION DOSE REDUCTION: This exam was performed according to the departmental dose-optimization program which includes automated exposure control, adjustment of the mA and/or kV according to patient size and/or use of iterative reconstruction technique. COMPARISON:  07/13/2009 FINDINGS: Brain: No acute infarct or hemorrhage. Lateral ventricles and midline structures are unremarkable. Incidental calcifications within the pineal gland. No acute extra-axial fluid collections.  No mass effect. Vascular: No hyperdense vessel or unexpected calcification. Skull: Normal. Negative for fracture or focal lesion. Sinuses/Orbits: Opacification of the right maxillary sinus. Significant mucoperiosteal thickening within the bilateral ethmoid air cells. Frontal sinuses are hypoplastic. Other: None. IMPRESSION: 1. No acute intracranial process. 2. Right maxillary and ethmoid sinus disease. Electronically Signed   By: Ozell Daring M.D.   On: 09/19/2024 18:58   DG Chest Port 1 View Result Date: 09/19/2024 CLINICAL DATA:  Chest pain. EXAM: PORTABLE CHEST 1 VIEW COMPARISON:  March 07, 2018 FINDINGS: The heart size and mediastinal contours are within normal limits. A coronary artery stent is in place. Both lungs are clear. The visualized skeletal structures are unremarkable. IMPRESSION: No active disease. Electronically Signed   By: Suzen Dials M.D.   On: 09/19/2024 18:50    Medications Ordered in the ED  lactated ringers  bolus 1,000 mL (0 mLs Intravenous Stopped 09/19/24 2228)                                    Medical Decision Making Amount and/or Complexity of Data  Reviewed Labs: ordered. Radiology: ordered.   Patient presents to the ED for concern of lightheadedness, diaphoresis, this involves an extensive number of treatment options, and is a complaint that carries with it a high risk of complications and morbidity.  The differential diagnosis includes ACS, electrolyte abnormality, seizure, stroke, hypoglycemia, dehydation   Co morbidities that complicate the patient evaluation  STEMI s/p stent x3 11/2023   Additional history obtained:  Additional history obtained from EMS, Family, Nursing, and Outside Medical Records   External records from outside source obtained and reviewed including EMS, daughter, triage RN note,   Lab Tests:  I Ordered, and personally interpreted labs.  The pertinent results include:   Hgb 11.3 BNP 508 Troponin negative CBG 81 UA wo infection   Imaging Studies ordered:  I ordered imaging studies including CXR, CT head  I independently visualized and interpreted imaging which showed  No acute cardiopulmonary pathology No acute intracranial abnormalities I agree with the radiologist interpretation   Cardiac Monitoring:  The patient was maintained on a cardiac monitor.  I personally viewed and interpreted the cardiac monitored which showed an underlying rhythm of: SR at 54bpm with no ischemic changes   Medicines ordered and prescription drug management:  I ordered medication including LR   for hydration  Reevaluation of the patient after these medicines showed that the patient improved I have reviewed the patients home medicines and have made adjustments as needed     Problem List / ED Course:  Lightheadedness Diaphoresis Vital signs notable for mild bradycardia at 56 bpm.  No hypotension or fever Patient describes 30-minute episode where she feels general malaise, lightheadedness, diaphoresis, mild confusion.  This episode was witnessed by daughter who denies any stroke symptoms, seizure symptoms,  obvious confusion.  EMS also denying any of the symptoms. Patient does have a history of STEMI with 3 stents that presented with atypical symptoms.  At that time, troponins were significantly elevated at 6000. Last echo 11/2023 LVEF 55-60% Neurological exam is negative for any focal deficits.  Although she does endorse some confusion at the time, she did not have any difficulty forming words, slurred speech, weakness, inability to ambulate.  No signs of seizure Lab work was reassuring.  CBG 81.  Troponin negative.  No significant electrolyte abnormalities. No signs of sepsis nor infection  CT head without ICH or any other acute abnormalities Chest x-ray without pneumonia, fluid Reviewed echo from 11/2023.  No reduced EF.  No signs of fluid overload on exam.  Provided patient with LR bolus for hydration I individually ambulated patient and she had no difficulty ambulating.  No need for assistance.  No complaints while ambulating. No orthostatic hypotension UA without infection I discussed with patient that with her heart score of 4 that she is a moderate risk for a cardiac event in the future.  I did offer her admission for ops overnight as patient is high risk however patient reports that she feels she is back at her baseline and can follow-up outpatient with cardiology.  I am reassured that her troponins are negative and she has not had any other symptoms. Patient to go home with daughter who can keep an eye on her Strict return precautions provided   Reevaluation:  After the interventions noted above, I reevaluated the patient and found that they have :resolved    Dispostion:  After consideration of the diagnostic results and the patients response to treatment, I feel that the patent would benefit from outpatient f/u with cards.   Discussed ED workup, disposition, return to ED precautions with patient who expresses understanding agrees with plan.  All questions answered to their  satisfaction.  They are agreeable to plan.  Discharge instructions provided on paperwork  Final diagnoses:  Malaise  Lightheadedness  Diaphoresis    ED Discharge Orders     None        Vanessa Wells, Vanessa Wells 09/19/24 2230  "

## 2024-09-19 NOTE — ED Triage Notes (Signed)
 Pt BIBA from home for diaphoresis and chest pressure. EMS stated that pt was very diaphoretic but with no chest pain. Pt took NTG at home prior to ems arrival. Hx of MI and stent placement. Pt would like blood work to rule out MI
